# Patient Record
Sex: Female | Born: 1950 | Race: Black or African American | Hispanic: No | Marital: Married | Smoking: Former smoker
Health system: Southern US, Community
[De-identification: ages and names within clinical notes are randomized; demographics above are authoritative.]

## PROBLEM LIST (undated history)

## (undated) DIAGNOSIS — R001 Bradycardia, unspecified: Secondary | ICD-10-CM

## (undated) DIAGNOSIS — I10 Essential (primary) hypertension: Secondary | ICD-10-CM

## (undated) DIAGNOSIS — Z959 Presence of cardiac and vascular implant and graft, unspecified: Secondary | ICD-10-CM

## (undated) DIAGNOSIS — R55 Syncope and collapse: Principal | ICD-10-CM

## (undated) DIAGNOSIS — E039 Hypothyroidism, unspecified: Secondary | ICD-10-CM

## (undated) DIAGNOSIS — R011 Cardiac murmur, unspecified: Secondary | ICD-10-CM

## (undated) DIAGNOSIS — R9431 Abnormal electrocardiogram [ECG] [EKG]: Secondary | ICD-10-CM

## (undated) DIAGNOSIS — F0390 Unspecified dementia without behavioral disturbance: Secondary | ICD-10-CM

## (undated) HISTORY — DX: Cardiac murmur, unspecified: R01.1

## (undated) HISTORY — DX: Syncope and collapse: R55

## (undated) HISTORY — DX: Bradycardia, unspecified: R00.1

## (undated) HISTORY — PX: AUGMENTATION MAMMAPLASTY: SUR837

## (undated) HISTORY — DX: Abnormal electrocardiogram (ECG) (EKG): R94.31

## (undated) HISTORY — DX: Presence of cardiac and vascular implant and graft, unspecified: Z95.9

---

## 2000-11-28 ENCOUNTER — Encounter: Payer: Self-pay | Admitting: Emergency Medicine

## 2000-11-28 ENCOUNTER — Emergency Department (HOSPITAL_COMMUNITY): Admission: EM | Admit: 2000-11-28 | Discharge: 2000-11-28 | Payer: Self-pay | Admitting: *Deleted

## 2003-08-15 ENCOUNTER — Encounter: Admission: RE | Admit: 2003-08-15 | Discharge: 2003-08-15 | Payer: Self-pay | Admitting: Family Medicine

## 2003-09-03 ENCOUNTER — Other Ambulatory Visit: Admission: RE | Admit: 2003-09-03 | Discharge: 2003-09-03 | Payer: Self-pay | Admitting: Internal Medicine

## 2003-09-07 ENCOUNTER — Encounter: Admission: RE | Admit: 2003-09-07 | Discharge: 2003-09-07 | Payer: Self-pay | Admitting: Internal Medicine

## 2009-01-12 HISTORY — PX: BREAST BIOPSY: SHX20

## 2009-06-24 ENCOUNTER — Ambulatory Visit: Payer: Self-pay | Admitting: General Surgery

## 2009-07-01 ENCOUNTER — Ambulatory Visit: Payer: Self-pay | Admitting: Gastroenterology

## 2009-11-19 ENCOUNTER — Ambulatory Visit: Payer: Self-pay | Admitting: General Surgery

## 2010-02-02 ENCOUNTER — Encounter: Payer: Self-pay | Admitting: Family Medicine

## 2010-05-26 ENCOUNTER — Ambulatory Visit: Payer: Self-pay | Admitting: General Surgery

## 2011-08-13 ENCOUNTER — Ambulatory Visit (INDEPENDENT_AMBULATORY_CARE_PROVIDER_SITE_OTHER): Payer: 59 | Admitting: Internal Medicine

## 2011-08-13 ENCOUNTER — Encounter: Payer: Self-pay | Admitting: Internal Medicine

## 2011-08-13 VITALS — BP 118/78 | HR 88 | Temp 98.5°F | Resp 16 | Ht 64.0 in | Wt 171.5 lb

## 2011-08-13 DIAGNOSIS — Z23 Encounter for immunization: Secondary | ICD-10-CM

## 2011-08-13 DIAGNOSIS — Z720 Tobacco use: Secondary | ICD-10-CM

## 2011-08-13 DIAGNOSIS — M549 Dorsalgia, unspecified: Secondary | ICD-10-CM

## 2011-08-13 DIAGNOSIS — E039 Hypothyroidism, unspecified: Secondary | ICD-10-CM

## 2011-08-13 DIAGNOSIS — F17200 Nicotine dependence, unspecified, uncomplicated: Secondary | ICD-10-CM

## 2011-08-13 DIAGNOSIS — Z1239 Encounter for other screening for malignant neoplasm of breast: Secondary | ICD-10-CM

## 2011-08-13 DIAGNOSIS — G8929 Other chronic pain: Secondary | ICD-10-CM

## 2011-08-13 LAB — TSH: TSH: 6.7 u[IU]/mL — ABNORMAL HIGH (ref 0.35–5.50)

## 2011-08-13 NOTE — Patient Instructions (Addendum)
[place]  Makes a low carb whole wheat tortilla that has 26 grams of fiber    We checked your thyroid today ,  We will call you with the results.   You received the TdaP vaccine today (tetanus-diphtheria-pertussis)

## 2011-08-13 NOTE — Progress Notes (Signed)
Patient ID: Christine Hull, female   DOB: [birth year], 61 y.o.   MRN: [id number]  Patient Active Problem List  Diagnosis  . Tobacco abuse  . Unspecified hypothyroidism  . Chronic back pain greater than 3 months duration    Subjective:  CC:   Chief Complaint  Patient presents with  . New Patient    HPI:   Christine Hull is a 61 y.o. female who presents as a new patient to establish primary care with the chief complaint of   History reviewed. No pertinent past medical history.  Past Surgical History  Procedure Date  . Breast biopsy 2011    Sankar,  normal    Family History  Problem Relation Age of Onset  . Early death Mother     stroke resulting from prior head injury  . Stroke Mother   . Early death Father     MVA  . Cancer Maternal Aunt     stomach cancer    History   Social History  . Marital Status: Married    Spouse Name: N/A    Number of Children: N/A  . Years of Education: N/A   Occupational History  . Not on file.   Social History Main Topics  . Smoking status: Current Everyday Smoker -- 0.5 packs/day    Types: Cigarettes  . Smokeless tobacco: Never Used  . Alcohol Use: Yes  . Drug Use: No  . Sexually Active: Not on file   Other Topics Concern  . Not on file   Social History Narrative  . No narrative on file     No Known Allergies   Review of Systems:   The remainder of the review of systems was negative except those addressed in the HPI.    Objective:  BP 118/78  Pulse 88  Temp 98.5 F (36.9 C) (Oral)  Resp 16  Ht 5\' 4"  (1.626 m)  Wt 171 lb 8 oz (77.792 kg)  BMI 29.44 kg/m2  SpO2 94%  General appearance: alert, cooperative and appears stated age Ears: normal TM's and external ear canals both ears Throat: lips, mucosa, and tongue normal; teeth and gums normal Neck: no adenopathy, no carotid bruit, supple, symmetrical, trachea midline and thyroid not enlarged, symmetric, no tenderness/mass/nodules Back: symmetric, no  curvature. ROM normal. No CVA tenderness. Lungs: clear to auscultation bilaterally Heart: regular rate and rhythm, S1, S2 normal, no murmur, click, rub or gallop Abdomen: soft, non-tender; bowel sounds normal; no masses,  no organomegaly Pulses: 2+ and symmetric Skin: Skin color, texture, turgor normal. No rashes or lesions Lymph nodes: Cervical, supraclavicular, and axillary nodes normal.  Assessment and Plan:  Unspecified hypothyroidism Currently underactive on 50 mcg of levothyroxine daily and Cytomel 10 mg daily. Increased dose of levothyroxine sent to pharmacy. Repeat levels in 6 weeks.  Chronic back pain greater than 3 months duration Secondary to degenerative disc disease. Pain is currently under control and is not requiring use of narcotics.  Tobacco abuse Risks of continued tobacco use were discussed. She is not currently interested in pharmacology for tobacco cessation.  Suggested gradually reducing the number of cigarettes over time by reducing her daily consumption by one cigarette per week    Updated Medication List Outpatient Encounter Prescriptions as of 08/13/2011  Medication Sig Dispense Refill  . cholecalciferol (VITAMIN D) 1000 UNITS tablet Take 1,000 Units by mouth daily.      . fish oil-omega-3 fatty acids 1000 MG capsule Take 2 g by mouth daily.      Marland Kitchen  levothyroxine (SYNTHROID, LEVOTHROID) 75 MCG tablet Take 1 tablet (75 mcg total) by mouth daily.  30 tablet  5  . liothyronine (CYTOMEL) 5 MCG tablet Take 5 mcg by mouth daily.      Marland Kitchen losartan-hydrochlorothiazide (HYZAAR) 50-12.5 MG per tablet Take 1 tablet by mouth daily.      Marland Kitchen DISCONTD: levothyroxine (SYNTHROID, LEVOTHROID) 50 MCG tablet Take 50 mcg by mouth daily.         Orders Placed This Encounter  Procedures  . MM Digital Screening  . Tdap vaccine greater than or equal to 7yo IM  . TSH  . TSH    No Follow-up on file.

## 2011-08-14 DIAGNOSIS — E039 Hypothyroidism, unspecified: Secondary | ICD-10-CM | POA: Insufficient documentation

## 2011-08-14 MED ORDER — LEVOTHYROXINE SODIUM 75 MCG PO TABS: 75.0000 ug | ORAL_TABLET | Freq: Every day | ORAL | Status: DC

## 2011-08-14 NOTE — Assessment & Plan Note (Signed)
Currently underactive on 50 mcg of levothyroxine daily and Cytomel 10 mg daily. Increased dose of levothyroxine sent to pharmacy. Repeat levels in 6 weeks.

## 2011-08-16 DIAGNOSIS — M549 Dorsalgia, unspecified: Secondary | ICD-10-CM | POA: Insufficient documentation

## 2011-08-16 NOTE — Assessment & Plan Note (Signed)
Secondary to degenerative disc disease. Pain is currently under control and is not requiring use of narcotics.   

## 2011-08-16 NOTE — Assessment & Plan Note (Signed)
Risks of continued tobacco use were discussed. She is not currently interested in pharmacology for tobacco cessation.  Suggested gradually reducing the number of cigarettes over time by reducing her daily consumption by one cigarette per week    

## 2011-08-27 ENCOUNTER — Ambulatory Visit: Payer: Self-pay | Admitting: Internal Medicine

## 2011-09-04 ENCOUNTER — Encounter: Payer: Self-pay | Admitting: Internal Medicine

## 2011-09-21 ENCOUNTER — Other Ambulatory Visit (INDEPENDENT_AMBULATORY_CARE_PROVIDER_SITE_OTHER): Payer: 59

## 2011-09-21 DIAGNOSIS — E039 Hypothyroidism, unspecified: Secondary | ICD-10-CM

## 2011-09-21 LAB — TSH: TSH: 2.38 u[IU]/mL (ref 0.35–5.50)

## 2011-11-21 ENCOUNTER — Ambulatory Visit (INDEPENDENT_AMBULATORY_CARE_PROVIDER_SITE_OTHER): Admitting: Family Medicine

## 2011-11-21 ENCOUNTER — Encounter: Payer: Self-pay | Admitting: Family Medicine

## 2011-11-21 VITALS — BP 158/98 | HR 61 | Temp 98.0°F | Resp 16 | Ht 63.75 in | Wt 176.6 lb

## 2011-11-21 DIAGNOSIS — J40 Bronchitis, not specified as acute or chronic: Secondary | ICD-10-CM

## 2011-11-21 DIAGNOSIS — J329 Chronic sinusitis, unspecified: Secondary | ICD-10-CM

## 2011-11-21 MED ORDER — AMOXICILLIN 875 MG PO TABS: 875.0000 mg | ORAL_TABLET | Freq: Two times a day (BID) | ORAL | Status: DC

## 2011-11-21 MED ORDER — FLUTICASONE PROPIONATE 50 MCG/ACT NA SUSP: NASAL | Status: DC

## 2011-11-21 NOTE — Progress Notes (Signed)
Subjective: Patient has been ill for almost 2 weeks. She was sitting on the couch watching television and little later woke up with a with a bad cough and cold. That is persisted for the past 2 weeks. It went from the chest to the head and then both. She's not been terribly although she doesn't feel well. She does smoke. 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 Her husband travels a lot so he has not had the same thing.  Objective: Pleasant lady in no major distress. Her TMs are normal. Nose congested. Throat clear. Neck supple without significant nodes. Chest is clear to auscultation. Heart regular without murmurs.  Assessment: Sinusitis/bronchitis  Plan: Amoxicillin 875 twice a day. Flow Flonase 2 sprays each nostril twice a day for 3 days then daily. Advise get plenty of rest drink plenty of fluids.

## 2011-11-21 NOTE — Patient Instructions (Addendum)
Drink lots of fluids  Get plenty of rest  Take medications as ordered  Stop smoking

## 2011-12-29 ENCOUNTER — Telehealth: Payer: Self-pay | Admitting: Internal Medicine

## 2011-12-29 MED ORDER — VITAMIN D (ERGOCALCIFEROL) 1.25 MG (50000 UNIT) PO CAPS: 50000.0000 [IU] | ORAL_CAPSULE | ORAL | Status: DC

## 2011-12-29 NOTE — Telephone Encounter (Signed)
Refill request for Vitamin D 16109 sent to [facility]

## 2011-12-29 NOTE — Telephone Encounter (Signed)
Pt called she needs refill on her vit d Pt stated she has been trying to get this for a couple weeks. [facility] [place]   Pt is completely out  Of meds

## 2012-02-12 ENCOUNTER — Other Ambulatory Visit: Payer: Self-pay | Admitting: Internal Medicine

## 2012-03-07 ENCOUNTER — Other Ambulatory Visit: Payer: Self-pay | Admitting: Internal Medicine

## 2012-03-08 NOTE — Telephone Encounter (Signed)
Pt last seen 8/13. Last TSH done on 09/21/11. Ok to fill?

## 2012-05-25 ENCOUNTER — Other Ambulatory Visit: Payer: Self-pay | Admitting: *Deleted

## 2012-05-25 MED ORDER — LIOTHYRONINE SODIUM 5 MCG PO TABS: 5.0000 ug | ORAL_TABLET | Freq: Every day | ORAL | Status: DC

## 2012-07-31 ENCOUNTER — Other Ambulatory Visit: Payer: Self-pay | Admitting: Internal Medicine

## 2012-09-07 ENCOUNTER — Other Ambulatory Visit: Payer: Self-pay | Admitting: Internal Medicine

## 2012-09-15 ENCOUNTER — Telehealth: Payer: Self-pay | Admitting: Internal Medicine

## 2012-09-15 NOTE — Telephone Encounter (Signed)
Called pharmacy, pt still has refills left on file.

## 2012-09-15 NOTE — Telephone Encounter (Signed)
Refill needed on BP medication losartan.  Advised pt of protocol to call pharmacy, states she will remember that for next time.

## 2012-09-16 NOTE — Telephone Encounter (Signed)
Patient notified

## 2012-10-11 ENCOUNTER — Other Ambulatory Visit: Payer: Self-pay | Admitting: Internal Medicine

## 2012-10-11 NOTE — Telephone Encounter (Signed)
Last seen on:08/13/11-No future appt scheduled. Please advise on refill

## 2012-10-12 ENCOUNTER — Telehealth: Payer: Self-pay | Admitting: Internal Medicine

## 2012-10-12 DIAGNOSIS — E785 Hyperlipidemia, unspecified: Secondary | ICD-10-CM

## 2012-10-12 DIAGNOSIS — Z79899 Other long term (current) drug therapy: Secondary | ICD-10-CM

## 2012-10-12 DIAGNOSIS — E559 Vitamin D deficiency, unspecified: Secondary | ICD-10-CM

## 2012-10-12 DIAGNOSIS — E039 Hypothyroidism, unspecified: Secondary | ICD-10-CM

## 2012-10-12 NOTE — Telephone Encounter (Signed)
30 days only on the synthroid,  Must be seen prior to more refills. Needs fasting labs done prior to visit.

## 2012-10-12 NOTE — Telephone Encounter (Signed)
No more refills without an office visit !!!!!

## 2012-10-18 NOTE — Telephone Encounter (Signed)
Patient notified by DPR to husband he will deliver message patient to call and schedule.

## 2012-10-23 ENCOUNTER — Other Ambulatory Visit: Payer: Self-pay | Admitting: Internal Medicine

## 2012-10-26 NOTE — Telephone Encounter (Signed)
Appt 11/16/12.

## 2012-10-28 ENCOUNTER — Ambulatory Visit: Payer: 59 | Admitting: Internal Medicine

## 2012-10-28 ENCOUNTER — Other Ambulatory Visit (INDEPENDENT_AMBULATORY_CARE_PROVIDER_SITE_OTHER): Payer: 59

## 2012-10-28 DIAGNOSIS — Z79899 Other long term (current) drug therapy: Secondary | ICD-10-CM

## 2012-10-28 DIAGNOSIS — E559 Vitamin D deficiency, unspecified: Secondary | ICD-10-CM

## 2012-10-28 DIAGNOSIS — E039 Hypothyroidism, unspecified: Secondary | ICD-10-CM

## 2012-10-28 DIAGNOSIS — E785 Hyperlipidemia, unspecified: Secondary | ICD-10-CM

## 2012-10-28 LAB — CBC WITH DIFFERENTIAL/PLATELET
Eosinophils Relative: 2.3 % (ref 0.0–5.0)
HCT: 39.4 % (ref 36.0–46.0)
Hemoglobin: 13.6 g/dL (ref 12.0–15.0)
Lymphocytes Relative: 52.3 % — ABNORMAL HIGH (ref 12.0–46.0)
Lymphs Abs: 2.5 10*3/uL (ref 0.7–4.0)
MCHC: 34.6 g/dL (ref 30.0–36.0)
MCV: 98.1 fl (ref 78.0–100.0)
Monocytes Absolute: 0.4 10*3/uL (ref 0.1–1.0)
Monocytes Relative: 7.4 % (ref 3.0–12.0)
Neutrophils Relative %: 37.4 % — ABNORMAL LOW (ref 43.0–77.0)
RBC: 4.01 Mil/uL (ref 3.87–5.11)
RDW: 13 % (ref 11.5–14.6)

## 2012-10-28 LAB — COMPREHENSIVE METABOLIC PANEL
ALT: 10 U/L (ref 0–35)
AST: 15 U/L (ref 0–37)
Alkaline Phosphatase: 76 U/L (ref 39–117)
BUN: 14 mg/dL (ref 6–23)
CO2: 28 mEq/L (ref 19–32)
Calcium: 9.8 mg/dL (ref 8.4–10.5)
Creatinine, Ser: 0.9 mg/dL (ref 0.4–1.2)
Glucose, Bld: 104 mg/dL — ABNORMAL HIGH (ref 70–99)
Sodium: 142 mEq/L (ref 135–145)

## 2012-10-31 ENCOUNTER — Encounter: Payer: Self-pay | Admitting: *Deleted

## 2012-11-16 ENCOUNTER — Encounter (INDEPENDENT_AMBULATORY_CARE_PROVIDER_SITE_OTHER): Payer: Self-pay

## 2012-11-16 ENCOUNTER — Encounter: Payer: Self-pay | Admitting: Internal Medicine

## 2012-11-16 ENCOUNTER — Ambulatory Visit (INDEPENDENT_AMBULATORY_CARE_PROVIDER_SITE_OTHER): Payer: 59 | Admitting: Internal Medicine

## 2012-11-16 VITALS — BP 104/74 | HR 72 | Temp 99.1°F | Resp 12 | Ht 64.25 in | Wt 168.0 lb

## 2012-11-16 DIAGNOSIS — D229 Melanocytic nevi, unspecified: Secondary | ICD-10-CM

## 2012-11-16 DIAGNOSIS — Z1239 Encounter for other screening for malignant neoplasm of breast: Secondary | ICD-10-CM

## 2012-11-16 DIAGNOSIS — D239 Other benign neoplasm of skin, unspecified: Secondary | ICD-10-CM

## 2012-11-16 DIAGNOSIS — M549 Dorsalgia, unspecified: Secondary | ICD-10-CM

## 2012-11-16 DIAGNOSIS — F17200 Nicotine dependence, unspecified, uncomplicated: Secondary | ICD-10-CM

## 2012-11-16 DIAGNOSIS — G8929 Other chronic pain: Secondary | ICD-10-CM

## 2012-11-16 DIAGNOSIS — Z0000 Encounter for general adult medical examination without abnormal findings: Secondary | ICD-10-CM

## 2012-11-16 DIAGNOSIS — E039 Hypothyroidism, unspecified: Secondary | ICD-10-CM

## 2012-11-16 DIAGNOSIS — Z720 Tobacco use: Secondary | ICD-10-CM

## 2012-11-16 MED ORDER — CYTOMEL 5 MCG PO TABS: ORAL_TABLET | ORAL | Status: DC

## 2012-11-16 MED ORDER — LEVOTHYROXINE SODIUM 75 MCG PO TABS: ORAL_TABLET | ORAL | Status: DC

## 2012-11-16 NOTE — Progress Notes (Signed)
Pre-visit discussion using our clinic review tool. No additional management support is needed unless otherwise documented below in the visit note.  

## 2012-11-16 NOTE — Assessment & Plan Note (Signed)
TSH is normal .  continue current medication regimen

## 2012-11-16 NOTE — Patient Instructions (Signed)
You had your annual wellness exam today  We will schedule your mammogram soon.  I am sending you to Dr Roseanne Kaufman, [facility] for evaluation of the mole on your back that I mentioned. Amber will call you  with the appt

## 2012-11-16 NOTE — Progress Notes (Signed)
Patient ID: Christine Hull, female   DOB: 11-Mar-1950, 62 y.o.   MRN: [id number]  .  Subjective:     Christine Hull is a 62 y.o. female and is here for a comprehensive physical exam. The patient reports no problems.  History   Social History  . Marital Status: Married    Spouse Name: N/A    Number of Children: N/A  . Years of Education: N/A   Occupational History  . Not on file.   Social History Main Topics  . Smoking status: Current Every Day Smoker -- 0.50 packs/day    Types: Cigarettes  . Smokeless tobacco: Never Used  . Alcohol Use: Yes  . Drug Use: No  . Sexual Activity: No   Other Topics Concern  . Not on file   Social History Narrative  . No narrative on file   Health Maintenance  Topic Date Due  . Influenza Vaccine  08/12/2013  . Pap Smear  08/16/2013  . Mammogram  08/26/2013  . Colonoscopy  11/17/2019  . Tetanus/tdap  08/12/2021  . Zostavax  Completed    The following portions of the patient's history were reviewed and updated as appropriate: allergies, current medications, past family history, past medical history, past social history, past surgical history and problem list.  Review of Systems A comprehensive review of systems was negative.   Objective:   BP 104/74  Pulse 72  Temp(Src) 99.1 F (37.3 C) (Oral)  Resp 12  Ht 5' 4.25" (1.632 m)  Wt 168 lb (76.204 kg)  BMI 28.61 kg/m2  SpO2 98%  General Appearance:    Alert, cooperative, no distress, appears stated age  Head:    Normocephalic, without obvious abnormality, atraumatic  Eyes:    PERRL, conjunctiva/corneas clear, EOM's intact, fundi    benign, both eyes  Ears:    Normal TM's and external ear canals, both ears  Nose:   Nares normal, septum midline, mucosa normal, no drainage    or sinus tenderness  Throat:   Lips, mucosa, and tongue normal; teeth and gums normal  Neck:   Supple, symmetrical, trachea midline, no adenopathy;    thyroid:  no enlargement/tenderness/nodules; no  carotid   bruit or JVD  Back:     Symmetric, no curvature, ROM normal, no CVA tenderness  Lungs:     Clear to auscultation bilaterally, respirations unlabored  Chest Wall:    No tenderness or deformity   Heart:    Regular rate and rhythm, S1 and S2 normal, no murmur, rub   or gallop  Breast Exam:    No tenderness, masses, or nipple abnormality  Abdomen:     Soft, non-tender, bowel sounds active all four quadrants,    no masses, no organomegaly  Extremities:   Extremities normal, atraumatic, no cyanosis or edema  Pulses:   2+ and symmetric all extremities  Skin:   Skin color, texture, turgor normal, no rashes or lesions  Lymph nodes:   Cervical, supraclavicular, and axillary nodes normal  Neurologic:   CNII-XII intact, normal strength, sensation and reflexes    throughout     Assessment:   Unspecified hypothyroidism TSH is normal .  continue current medication regimen   Routine general medical examination at a health care facility Annual comprehensive exam was done including breast, excluding pelvic and PAP smear. All screenings have been addressed .   Chronic back pain greater than 3 months duration Secondary to degenerative disc disease. Pain is currently under control and is not  requiring use of narcotics.    Tobacco abuse Risks of continued tobacco use were discussed. She is not currently interested in pharmacology for tobacco cessation.  Suggested gradually reducing the number of cigarettes over time by reducing her daily consumption by one cigarette per week      Updated Medication List Outpatient Encounter Prescriptions as of 11/16/2012  Medication Sig  . CYTOMEL 5 MCG tablet TAKE 1 TABLET (5 MCG TOTAL) BY MOUTH DAILY.  . fish oil-omega-3 fatty acids 1000 MG capsule Take 2 g by mouth daily.  Marland Kitchen levothyroxine (SYNTHROID) 75 MCG tablet TAKE 1 TABLET (75 MCG TOTAL) BY MOUTH DAILY.  Marland Kitchen losartan-hydrochlorothiazide (HYZAAR) 50-12.5 MG per tablet TAKE 1 TABLET BY MOUTH EVERY  MORNING  . Vitamin D, Ergocalciferol, (DRISDOL) 50000 UNITS CAPS capsule Take 50,000 Units by mouth every 30 (thirty) days.  . [DISCONTINUED] CYTOMEL 5 MCG tablet TAKE 1 TABLET (5 MCG TOTAL) BY MOUTH DAILY.  . [DISCONTINUED] SYNTHROID 75 MCG tablet TAKE 1 TABLET (75 MCG TOTAL) BY MOUTH DAILY.  . [DISCONTINUED] amoxicillin (AMOXIL) 875 MG tablet Take 1 tablet (875 mg total) by mouth 2 (two) times daily.  . [DISCONTINUED] fluticasone (FLONASE) 50 MCG/ACT nasal spray Use 2 sprays each nostril twice daily for 3 days, then once daily  . [DISCONTINUED] SYNTHROID 75 MCG tablet TAKE 1 TABLET (75 MCG TOTAL) BY MOUTH DAILY.  . [DISCONTINUED] Vitamin D, Ergocalciferol, (DRISDOL) 50000 UNITS CAPS Take 1 capsule (50,000 Units total) by mouth every 7 (seven) days.

## 2012-11-17 ENCOUNTER — Encounter: Payer: Self-pay | Admitting: Internal Medicine

## 2012-11-17 DIAGNOSIS — Z0000 Encounter for general adult medical examination without abnormal findings: Secondary | ICD-10-CM | POA: Insufficient documentation

## 2012-11-17 NOTE — Assessment & Plan Note (Signed)
Annual comprehensive exam was done including breast, excluding pelvic and PAP smear. All screenings have been addressed .  

## 2012-11-17 NOTE — Assessment & Plan Note (Signed)
Risks of continued tobacco use were discussed. She is not currently interested in pharmacology for tobacco cessation.  Suggested gradually reducing the number of cigarettes over time by reducing her daily consumption by one cigarette per week

## 2012-11-17 NOTE — Assessment & Plan Note (Signed)
Secondary to degenerative disc disease. Pain is currently under control and is not requiring use of narcotics.

## 2013-01-05 ENCOUNTER — Other Ambulatory Visit: Payer: Self-pay | Admitting: Internal Medicine

## 2013-02-02 ENCOUNTER — Encounter (HOSPITAL_COMMUNITY): Payer: Self-pay | Admitting: Emergency Medicine

## 2013-02-02 ENCOUNTER — Emergency Department (HOSPITAL_COMMUNITY): Payer: BC Managed Care – PPO

## 2013-02-02 ENCOUNTER — Inpatient Hospital Stay (HOSPITAL_COMMUNITY)
Admission: EM | Admit: 2013-02-02 | Discharge: 2013-02-03 | DRG: 262 | Disposition: A | Discharge status: Home / Self Care | Payer: BC Managed Care – PPO | Attending: Internal Medicine | Admitting: Internal Medicine

## 2013-02-02 ENCOUNTER — Inpatient Hospital Stay (HOSPITAL_COMMUNITY): Payer: BC Managed Care – PPO

## 2013-02-02 DIAGNOSIS — R51 Headache: Secondary | ICD-10-CM | POA: Diagnosis present

## 2013-02-02 DIAGNOSIS — F17200 Nicotine dependence, unspecified, uncomplicated: Secondary | ICD-10-CM | POA: Diagnosis present

## 2013-02-02 DIAGNOSIS — I369 Nonrheumatic tricuspid valve disorder, unspecified: Secondary | ICD-10-CM

## 2013-02-02 DIAGNOSIS — Z823 Family history of stroke: Secondary | ICD-10-CM

## 2013-02-02 DIAGNOSIS — R413 Other amnesia: Secondary | ICD-10-CM

## 2013-02-02 DIAGNOSIS — I10 Essential (primary) hypertension: Secondary | ICD-10-CM | POA: Diagnosis present

## 2013-02-02 DIAGNOSIS — F0150 Vascular dementia without behavioral disturbance: Secondary | ICD-10-CM

## 2013-02-02 DIAGNOSIS — E039 Hypothyroidism, unspecified: Secondary | ICD-10-CM | POA: Diagnosis present

## 2013-02-02 DIAGNOSIS — R519 Headache, unspecified: Secondary | ICD-10-CM

## 2013-02-02 DIAGNOSIS — R9431 Abnormal electrocardiogram [ECG] [EKG]: Secondary | ICD-10-CM

## 2013-02-02 DIAGNOSIS — E876 Hypokalemia: Secondary | ICD-10-CM | POA: Diagnosis present

## 2013-02-02 DIAGNOSIS — R55 Syncope and collapse: Secondary | ICD-10-CM

## 2013-02-02 DIAGNOSIS — Z9181 History of falling: Secondary | ICD-10-CM

## 2013-02-02 HISTORY — DX: Syncope and collapse: R55

## 2013-02-02 HISTORY — DX: Essential (primary) hypertension: I10

## 2013-02-02 HISTORY — DX: Abnormal electrocardiogram (ECG) (EKG): R94.31

## 2013-02-02 HISTORY — DX: Hypothyroidism, unspecified: E03.9

## 2013-02-02 LAB — URINALYSIS, ROUTINE W REFLEX MICROSCOPIC
Bilirubin Urine: NEGATIVE
Glucose, UA: NEGATIVE mg/dL
Hgb urine dipstick: NEGATIVE
Ketones, ur: NEGATIVE mg/dL
LEUKOCYTES UA: NEGATIVE
NITRITE: NEGATIVE
PH: 6.5 (ref 5.0–8.0)
Protein, ur: NEGATIVE mg/dL
SPECIFIC GRAVITY, URINE: 1.004 — AB (ref 1.005–1.030)
UROBILINOGEN UA: 0.2 mg/dL (ref 0.0–1.0)

## 2013-02-02 LAB — CBC WITH DIFFERENTIAL/PLATELET
BASOS ABS: 0 10*3/uL (ref 0.0–0.1)
Basophils Relative: 1 % (ref 0–1)
Eosinophils Absolute: 0.1 10*3/uL (ref 0.0–0.7)
Eosinophils Relative: 1 % (ref 0–5)
HEMATOCRIT: 37.6 % (ref 36.0–46.0)
Hemoglobin: 13.2 g/dL (ref 12.0–15.0)
LYMPHS PCT: 13 % (ref 12–46)
Lymphs Abs: 1 10*3/uL (ref 0.7–4.0)
MCH: 34.4 pg — ABNORMAL HIGH (ref 26.0–34.0)
MCHC: 35.1 g/dL (ref 30.0–36.0)
MCV: 97.9 fL (ref 78.0–100.0)
Monocytes Absolute: 0.7 10*3/uL (ref 0.1–1.0)
Monocytes Relative: 9 % (ref 3–12)
NEUTROS PCT: 77 % (ref 43–77)
Neutro Abs: 5.6 10*3/uL (ref 1.7–7.7)
PLATELETS: 271 10*3/uL (ref 150–400)
RBC: 3.84 MIL/uL — ABNORMAL LOW (ref 3.87–5.11)
RDW: 13 % (ref 11.5–15.5)
WBC: 7.3 10*3/uL (ref 4.0–10.5)

## 2013-02-02 LAB — CBC
HCT: 36.6 % (ref 36.0–46.0)
HEMOGLOBIN: 12.6 g/dL (ref 12.0–15.0)
MCH: 34.1 pg — ABNORMAL HIGH (ref 26.0–34.0)
MCHC: 34.4 g/dL (ref 30.0–36.0)
MCV: 98.9 fL (ref 78.0–100.0)
Platelets: 273 10*3/uL (ref 150–400)
RBC: 3.7 MIL/uL — AB (ref 3.87–5.11)
RDW: 12.7 % (ref 11.5–15.5)
WBC: 7.8 10*3/uL (ref 4.0–10.5)

## 2013-02-02 LAB — TROPONIN I

## 2013-02-02 LAB — COMPREHENSIVE METABOLIC PANEL
ALT: 11 U/L (ref 0–35)
AST: 23 U/L (ref 0–37)
Albumin: 3.8 g/dL (ref 3.5–5.2)
Alkaline Phosphatase: 96 U/L (ref 39–117)
BUN: 11 mg/dL (ref 6–23)
CO2: 26 mEq/L (ref 19–32)
Calcium: 9.2 mg/dL (ref 8.4–10.5)
Chloride: 102 mEq/L (ref 96–112)
Creatinine, Ser: 0.96 mg/dL (ref 0.50–1.10)
GFR calc Af Amer: 71 mL/min — ABNORMAL LOW (ref >90)
GFR calc non Af Amer: 62 mL/min — ABNORMAL LOW (ref >90)
GLUCOSE: 114 mg/dL — AB (ref 70–99)
POTASSIUM: 3.6 meq/L — AB (ref 3.7–5.3)
Sodium: 143 mEq/L (ref 137–147)
TOTAL PROTEIN: 7.6 g/dL (ref 6.0–8.3)
Total Bilirubin: 0.7 mg/dL (ref 0.3–1.2)

## 2013-02-02 LAB — CREATININE, SERUM
CREATININE: 0.92 mg/dL (ref 0.50–1.10)
GFR calc Af Amer: 75 mL/min — ABNORMAL LOW (ref >90)
GFR calc non Af Amer: 65 mL/min — ABNORMAL LOW (ref >90)

## 2013-02-02 LAB — MAGNESIUM: Magnesium: 1.9 mg/dL (ref 1.5–2.5)

## 2013-02-02 LAB — TSH: TSH: 11.291 u[IU]/mL — AB (ref 0.350–4.500)

## 2013-02-02 LAB — RPR: RPR Ser Ql: NONREACTIVE

## 2013-02-02 LAB — VITAMIN B12: Vitamin B-12: 472 pg/mL (ref 211–911)

## 2013-02-02 MED ORDER — ONDANSETRON HCL 4 MG PO TABS: 4.0000 mg | ORAL_TABLET | Freq: Four times a day (QID) | ORAL | Status: DC | PRN

## 2013-02-02 MED ORDER — LEVOTHYROXINE SODIUM 75 MCG PO TABS
75.0000 ug | ORAL_TABLET | Freq: Every day | ORAL | Status: DC
  Administered 2013-02-03: 75 ug via ORAL
  Filled 2013-02-02 (×2): qty 1

## 2013-02-02 MED ORDER — POTASSIUM CHLORIDE CRYS ER 20 MEQ PO TBCR
40.0000 meq | EXTENDED_RELEASE_TABLET | Freq: Once | ORAL | Status: AC
  Administered 2013-02-02: 40 meq via ORAL
  Filled 2013-02-02: qty 2

## 2013-02-02 MED ORDER — NADOLOL 20 MG PO TABS
20.0000 mg | ORAL_TABLET | Freq: Two times a day (BID) | ORAL | Status: DC
  Administered 2013-02-03 (×2): 20 mg via ORAL
  Filled 2013-02-02 (×3): qty 1

## 2013-02-02 MED ORDER — PNEUMOCOCCAL VAC POLYVALENT 25 MCG/0.5ML IJ INJ
0.5000 mL | INJECTION | INTRAMUSCULAR | Status: DC
  Filled 2013-02-02: qty 0.5

## 2013-02-02 MED ORDER — HYDROCODONE-ACETAMINOPHEN 5-325 MG PO TABS: 1.0000 | ORAL_TABLET | ORAL | Status: DC | PRN

## 2013-02-02 MED ORDER — POTASSIUM CHLORIDE CRYS ER 20 MEQ PO TBCR
20.0000 meq | EXTENDED_RELEASE_TABLET | Freq: Every day | ORAL | Status: DC
  Filled 2013-02-02: qty 1

## 2013-02-02 MED ORDER — SODIUM CHLORIDE 0.9 % IJ SOLN: 3.0000 mL | Freq: Two times a day (BID) | INTRAMUSCULAR | Status: DC

## 2013-02-02 MED ORDER — SODIUM CHLORIDE 0.9 % IV SOLN: INTRAVENOUS | Status: DC

## 2013-02-02 MED ORDER — OMEGA-3-ACID ETHYL ESTERS 1 G PO CAPS
1.0000 g | ORAL_CAPSULE | Freq: Every day | ORAL | Status: DC
  Administered 2013-02-02 – 2013-02-03 (×2): 1 g via ORAL
  Filled 2013-02-02 (×2): qty 1

## 2013-02-02 MED ORDER — POTASSIUM CHLORIDE CRYS ER 20 MEQ PO TBCR: 20.0000 meq | EXTENDED_RELEASE_TABLET | Freq: Every day | ORAL | Status: DC

## 2013-02-02 MED ORDER — ENOXAPARIN SODIUM 40 MG/0.4ML ~~LOC~~ SOLN
40.0000 mg | SUBCUTANEOUS | Status: DC
  Administered 2013-02-02: 40 mg via SUBCUTANEOUS
  Filled 2013-02-02 (×2): qty 0.4

## 2013-02-02 MED ORDER — FOLIC ACID 1 MG PO TABS
1.0000 mg | ORAL_TABLET | Freq: Every day | ORAL | Status: DC
  Administered 2013-02-03: 1 mg via ORAL
  Filled 2013-02-02 (×2): qty 1

## 2013-02-02 MED ORDER — ACETAMINOPHEN 650 MG RE SUPP: 650.0000 mg | Freq: Four times a day (QID) | RECTAL | Status: DC | PRN

## 2013-02-02 MED ORDER — ACETAMINOPHEN 325 MG PO TABS: 650.0000 mg | ORAL_TABLET | Freq: Four times a day (QID) | ORAL | Status: DC | PRN

## 2013-02-02 MED ORDER — LIOTHYRONINE SODIUM 5 MCG PO TABS
5.0000 ug | ORAL_TABLET | Freq: Every day | ORAL | Status: DC
  Administered 2013-02-02 – 2013-02-03 (×2): 5 ug via ORAL
  Filled 2013-02-02 (×2): qty 1

## 2013-02-02 MED ORDER — VITAMIN D (ERGOCALCIFEROL) 1.25 MG (50000 UNIT) PO CAPS: 50000.0000 [IU] | ORAL_CAPSULE | ORAL | Status: DC

## 2013-02-02 MED ORDER — ONDANSETRON HCL 4 MG/2ML IJ SOLN: 4.0000 mg | Freq: Four times a day (QID) | INTRAMUSCULAR | Status: DC | PRN

## 2013-02-02 MED ORDER — SODIUM CHLORIDE 0.9 % IV SOLN: INTRAVENOUS | Status: AC

## 2013-02-02 MED ORDER — ADULT MULTIVITAMIN W/MINERALS CH
1.0000 | ORAL_TABLET | Freq: Every day | ORAL | Status: DC
  Administered 2013-02-03: 1 via ORAL
  Filled 2013-02-02 (×2): qty 1

## 2013-02-02 MED ORDER — OMEGA-3 FATTY ACIDS 1000 MG PO CAPS: 1.0000 g | ORAL_CAPSULE | Freq: Every day | ORAL | Status: DC

## 2013-02-02 MED ORDER — POTASSIUM CHLORIDE CRYS ER 20 MEQ PO TBCR: 40.0000 meq | EXTENDED_RELEASE_TABLET | Freq: Once | ORAL | Status: AC

## 2013-02-02 NOTE — ED Provider Notes (Signed)
CSN: [id number]     Arrival date & time 02/02/13  0102 History   First MD Initiated Contact with Patient 02/02/13 [phone]     Chief Complaint  Patient presents with  . Near Syncope    HPI  Patient presents after an episode of lightheadedness, head pain. Patient is in generally well, but only a history of hypertension and thyroid disorder. One week ago the patient had a fall. Fall was notable for a sodium.  Amnesia. The patient recalls only walking, then after a period of no recall she felt lightheaded, and after some time returned to baseline in terms of cognition.  The patient sustained facial abrasions, hematoma during the fall. She was not seen by a medical practitioner after the fall. In the interval week she has had generally improving condition. Over the past hours however, she has felt lightheaded, near syncopal, with no pain about the occiput. No complete syncope. Pain is worse with spine flexion. No vomiting, diarrhea, incontinence, visual changes, confusion, disorientation currently. No unilateral weakness.  Past Medical History  Diagnosis Date  . Thyroid disease   . Heart murmur   . Hypertension    Past Surgical History  Procedure Laterality Date  . Breast biopsy  2011    Sankar,  normal   Family History  Problem Relation Age of Onset  . Early death Mother     stroke resulting from prior head injury  . Stroke Mother   . Early death Father     MVA  . Cancer Maternal Aunt     stomach cancer   History  Substance Use Topics  . Smoking status: Current Every Day Smoker -- 0.50 packs/day    Types: Cigarettes  . Smokeless tobacco: Never Used  . Alcohol Use: Yes   OB History   Grav Para Term Preterm Abortions TAB SAB Ect Mult Living                 Review of Systems  Constitutional:       Per HPI, otherwise negative  HENT:       Per HPI, otherwise negative  Eyes: Negative for visual disturbance.  Respiratory:       Per HPI, otherwise negative    Cardiovascular:       Per HPI, otherwise negative  Gastrointestinal: Negative for nausea and vomiting.  Endocrine:       Negative aside from HPI  Genitourinary:       Neg aside from HPI   Musculoskeletal:       Per HPI, otherwise negative  Skin: Positive for wound.  Neurological: Positive for syncope and light-headedness. Negative for tremors, seizures, facial asymmetry and speech difficulty.    Allergies  Review of patient's allergies indicates no known allergies.  Home Medications   Current Outpatient Rx  Name  Route  Sig  Dispense  Refill  . CYTOMEL 5 MCG tablet      TAKE 1 TABLET (5 MCG TOTAL) BY MOUTH DAILY.   30 tablet   5     Dispense as written.   . fish oil-omega-3 fatty acids 1000 MG capsule   Oral   Take 2 g by mouth daily.         Marland Kitchen levothyroxine (SYNTHROID) 75 MCG tablet      TAKE 1 TABLET (75 MCG TOTAL) BY MOUTH DAILY.   30 tablet   5   . losartan-hydrochlorothiazide (HYZAAR) 50-12.5 MG per tablet      TAKE 1 TABLET BY MOUTH  EVERY MORNING   30 tablet   5   . Vitamin D, Ergocalciferol, (DRISDOL) 50000 UNITS CAPS capsule   Oral   Take 50,000 Units by mouth every 30 (thirty) days.          There were no vitals taken for this visit. Physical Exam  Nursing note and vitals reviewed. Constitutional: She is oriented to person, place, and time. She appears well-developed and well-nourished. No distress.  HENT:  Head: Normocephalic and atraumatic.    Eyes: Conjunctivae and EOM are normal.  Neck:  Patient has appropriate range of motion, though she has pain in the midline mid cervical spine with right rotation, neck extension  Cardiovascular: Normal rate and regular rhythm.   Pulmonary/Chest: Effort normal and breath sounds normal. No stridor. No respiratory distress.  Abdominal: She exhibits no distension.  Musculoskeletal: She exhibits no edema.  Neurological: She is alert and oriented to person, place, and time. She is not disoriented. She  displays no atrophy and no tremor. No cranial nerve deficit or sensory deficit. She exhibits normal muscle tone. She displays no seizure activity. Coordination normal.  Skin: Skin is warm and dry.  Psychiatric: She has a normal mood and affect.    ED Course  Procedures (including critical care time) Labs Review Labs Reviewed  COMPREHENSIVE METABOLIC PANEL  CBC WITH DIFFERENTIAL  URINALYSIS, ROUTINE W REFLEX MICROSCOPIC   Imaging Review No results found.  EKG Interpretation    Date/Time:  Thursday February 02 2013 07:57:25 EST Ventricular Rate:  61 PR Interval:  164 QRS Duration: 80 QT Interval:  523 QTC Calculation: 527 R Axis:   30 Text Interpretation:  Sinus rhythm Left atrial enlargement Low voltage, precordial leads Prolonged QT interval Sinus rhythm Artifact Left atrial enlargement T wave abnormality QT prolonged Abnormal ekg Confirmed by Carmin Muskrat  MD ([id number]) on 02/02/2013 8:08:42 AM           On re-exam the patient appears calm. We discussed all results.  I discussed her care w cards.  She will be admitted to IM w concern for syncope versus seizure.  MDM   1. Near syncope   2. Long QT interval    Patient presents after an episode of near syncope. By the time of my evaluation the patient has returned to baseline. Notably, the patient had a similar event one week ago that resulted in complete loss of consciousness, with subsequent period of amnesia.  These new events raise concern for dysrhythmia versus seizure. Today's ED eval demonstrates prolonged QT. Patient has minimal risk for dysrhythmia or seizure, but no prior eval. She was admitted for further E/M.     Carmin Muskrat, MD 02/02/13 1055

## 2013-02-02 NOTE — ED Notes (Signed)
Pt presents to department for evaluation of near syncope and headache. States she feels very lightheaded and dizziness this morning. Also states headache. Head pain becomes worse when bending over. 8/10 pain at the time. Also states she passed out and fell at home last week. Pt is conscious alert and oriented x4. No neurological deficits noted at the present.

## 2013-02-02 NOTE — Consult Note (Signed)
ELECTROPHYSIOLOGY CONSULT NOTE  Patient ID: Christine Hull MRN: [id number], DOB/AGE: [birth year]   Admit date: 02/02/2013 Date of Consult: 02/02/2013  Primary Physician: Duncan Dull, MD Primary Cardiologist: New to [facility] - Mayford Knife, MD Reason for Consultation: Syncope  History of Present Illness Christine Hull is a 63 y.o. female with HTN and hypothyroidism who presents to [facility] ED with syncope. She is accompanied by her daughter who assists with history. Two weeks ago she passed out at home while walking to the kitchen. She reports feeling like her usual self having "a normal day." She reports feeling "very heavy" then falling. However, she doesn't recall much else about the episode. She states it seemed to happen very quickly. She denies warning or prodrome. She denies CP, SOB, dizziness or palpitations. After regaining consciousness, she realized she injured herself and struck her head she presumes on the floor. She droveherself to her daughter's house after the episode. Her daughter has pictures on her iPhone which show large abrasion and small hematoma on the right cheek and forehead. She did not want to come to the hospital at that time stating, "I'm a tough ol' woman." Today she developed a severe headache prompting her to seek medical attention. She denies any history of CAD/MI/valvular heart disease or CHF. She denies any recent changes to her medications.  Past Medical History Past Medical History  Diagnosis Date  . Heart murmur   . Hypertension   . Hypothyroidism     Past Surgical History Past Surgical History  Procedure Laterality Date  . Breast biopsy  2011    Sankar,  normal    Allergies/Intolerances No Known Allergies  Current Home Medications      fish oil-omega-3 fatty acids 1000 MG capsule  Take 1 g by mouth daily.     levothyroxine 75 MCG tablet  Commonly known as:  SYNTHROID, LEVOTHROID  Take 75 mcg by mouth daily before breakfast.      liothyronine 5 MCG tablet  Commonly known as:  CYTOMEL  Take 5 mcg by mouth daily.     losartan-hydrochlorothiazide 50-12.5 MG per tablet  Commonly known as:  HYZAAR  Take 1 tablet by mouth daily.     Vitamin D (Ergocalciferol) 50000 UNITS Caps capsule  Commonly known as:  DRISDOL  Take 50,000 Units by mouth every 30 (thirty) days.     Inpatient Medications . sodium chloride   Intravenous STAT  . enoxaparin (LOVENOX) injection  40 mg Subcutaneous Q24H  . folic acid  1 mg Oral Daily  . [START ON 02/03/2013] levothyroxine  75 mcg Oral QAC breakfast  . liothyronine  5 mcg Oral Daily  . multivitamin with minerals  1 tablet Oral Daily  . omega-3 acid ethyl esters  1 g Oral Daily  . sodium chloride  3 mL Intravenous Q12H  . [START ON 02/12/2013] Vitamin D (Ergocalciferol)  50,000 Units Oral Q30 days   . sodium chloride      Family History Family History  Problem Relation Age of Onset  . Early death Mother     stroke resulting from prior head injury  . Stroke Mother   . Early death Father     MVA  . Cancer Maternal Aunt     stomach cancer     Social History History   Social History  . Marital Status: Married    Spouse Name: N/A    Number of Children: N/A  . Years of Education: N/A   Occupational History  .  Not on file.   Social History Main Topics  . Smoking status: Current Every Day Smoker -- 0.50 packs/day    Types: Cigarettes  . Smokeless tobacco: Never Used  . Alcohol Use: Yes     Comment: drinks it in coffee nightly before bed  . Drug Use: No  . Sexual Activity: No   Other Topics Concern  . Not on file   Social History Narrative  . No narrative on file     Review of Systems General: No chills, fever, night sweats or weight changes  Cardiovascular:  No chest pain, dyspnea on exertion, edema, orthopnea, palpitations, paroxysmal nocturnal dyspnea Dermatological: No rash, lesions or masses Respiratory: No cough, dyspnea Urologic: No hematuria,  dysuria Abdominal: No nausea, vomiting, diarrhea, bright red blood per rectum, melena, or hematemesis Neurologic: No visual changes, weakness, changes in mental status All other systems reviewed and are otherwise negative except as noted above.  Physical Exam Vitals: Blood pressure 100/60, pulse 71, temperature 98.9 F (37.2 C), temperature source Oral, resp. rate 18, height 5' 0.5" (1.537 m), weight 161 lb 1.6 oz (73.074 kg), SpO2 100.00%.  General: Well developed, well appearing 63 y.o. female in no acute distress. HEENT: Normocephalic, atraumatic. EOMs intact. Sclera nonicteric. Oropharynx clear.  Neck: Supple without bruits. No JVD. Lungs: Respirations regular and unlabored, CTA bilaterally. No wheezes, rales or rhonchi. Heart: RRR. S1, S2 present. No murmurs, rub, S3 or S4. Abdomen: Soft, non-tender, non-distended. BS present x 4 quadrants. No hepatosplenomegaly.  Extremities: No clubbing, cyanosis or edema. DP/PT/Radials 2+ and equal bilaterally. Psych: Normal affect. Neuro: Alert and oriented X 3. Moves all extremities spontaneously. Musculoskeletal: No kyphosis. Skin: Intact. Warm and dry. No rashes or petechiae in exposed areas.   Labs Lab Results  Component Value Date   WBC 7.3 02/02/2013   HGB 13.2 02/02/2013   HCT 37.6 02/02/2013   MCV 97.9 02/02/2013   PLT 271 02/02/2013    Recent Labs Lab 02/02/13 0825  NA 143  K 3.6*  CL 102  CO2 26  BUN 11  CREATININE 0.96  CALCIUM 9.2  PROT 7.6  BILITOT 0.7  ALKPHOS 96  ALT 11  AST 23  GLUCOSE 114*    Radiology/Studies Ct Head Wo Contrast  02/02/2013   CLINICAL DATA:  Fall 1 week ago; persistent left head and neck pain  EXAM: CT HEAD WITHOUT CONTRAST  CT CERVICAL SPINE WITHOUT CONTRAST  TECHNIQUE: Multidetector CT imaging of the head and cervical spine was performed following the standard protocol without intravenous contrast. Multiplanar CT image reconstructions of the cervical spine were also generated.  COMPARISON:  The  report from a remote prior CT scan of the head dated 11/28/2000 is available for review ; thyroid ultrasound 08/15/2003  FINDINGS: CT HEAD FINDINGS  Negative for acute intracranial hemorrhage, acute infarction, mass, mass effect, hydrocephalus or midline shift. Gray-white differentiation is preserved throughout.  No focal soft tissue or calvarial abnormality. No scalp hematoma. The visualized globes and orbits are intact and symmetric bilaterally. Minimal mineralization within the bilateral globus pallidum. Trace atherosclerotic calcification in the carotid siphons. Normal aeration of the mastoid air cells and visualized paranasal sinuses.  CT CERVICAL SPINE FINDINGS  No acute fracture, malalignment or prevertebral soft tissue swelling. Multilevel degenerative spondylosis of the cervical spine with the most significant areas at C4-C5, C5-C6, C6-C7 and T1-T2. There is a large posterior disc osteophyte complex at T1-T2 likely resulting in central canal stenosis. There is mild reversal of the normal cervical lordosis. Heterogeneous  and enlarged appearance of the thyroid gland. Similar findings were demonstrated on the remote prior ultrasound. No acute soft tissue abnormality. The lung apices are unremarkable.  IMPRESSION: CT HEAD:  Negative.  CT C SPINE:  1. No acute fracture or malalignment. 2. Multilevel cervical spondylosis most significant at T1-T2 or a large posterior disc osteophyte complex likely results and central canal stenosis. 3. Reversal of the normal cervical lordosis favored to be degenerative in the etiology. 4. Enlarged and heterogeneous thyroid gland. Similar findings seen on remote prior thyroid ultrasound from August of 2005.   Electronically Signed   By: Jacqulynn Cadet M.D.   On: 02/02/2013 08:29   Ct Cervical Spine Wo Contrast  02/02/2013   CLINICAL DATA:  Fall 1 week ago; persistent left head and neck pain  EXAM: CT HEAD WITHOUT CONTRAST  CT CERVICAL SPINE WITHOUT CONTRAST  TECHNIQUE:  Multidetector CT imaging of the head and cervical spine was performed following the standard protocol without intravenous contrast. Multiplanar CT image reconstructions of the cervical spine were also generated.  COMPARISON:  The report from a remote prior CT scan of the head dated 11/28/2000 is available for review ; thyroid ultrasound 08/15/2003  FINDINGS: CT HEAD FINDINGS  Negative for acute intracranial hemorrhage, acute infarction, mass, mass effect, hydrocephalus or midline shift. Gray-white differentiation is preserved throughout.  No focal soft tissue or calvarial abnormality. No scalp hematoma. The visualized globes and orbits are intact and symmetric bilaterally. Minimal mineralization within the bilateral globus pallidum. Trace atherosclerotic calcification in the carotid siphons. Normal aeration of the mastoid air cells and visualized paranasal sinuses.  CT CERVICAL SPINE FINDINGS  No acute fracture, malalignment or prevertebral soft tissue swelling. Multilevel degenerative spondylosis of the cervical spine with the most significant areas at C4-C5, C5-C6, C6-C7 and T1-T2. There is a large posterior disc osteophyte complex at T1-T2 likely resulting in central canal stenosis. There is mild reversal of the normal cervical lordosis. Heterogeneous and enlarged appearance of the thyroid gland. Similar findings were demonstrated on the remote prior ultrasound. No acute soft tissue abnormality. The lung apices are unremarkable.  IMPRESSION: CT HEAD:  Negative.  CT C SPINE:  1. No acute fracture or malalignment. 2. Multilevel cervical spondylosis most significant at T1-T2 or a large posterior disc osteophyte complex likely results and central canal stenosis. 3. Reversal of the normal cervical lordosis favored to be degenerative in the etiology. 4. Enlarged and heterogeneous thyroid gland. Similar findings seen on remote prior thyroid ultrasound from August of 2005.   Electronically Signed   By: Jacqulynn Cadet  M.D.   On: 02/02/2013 08:29   Mr Brain Wo Contrast  02/02/2013   CLINICAL DATA:  Presyncope/syncope, memory problems, and headaches. Recent fall. Rule out stroke.  EXAM: MRI HEAD WITHOUT CONTRAST  TECHNIQUE: Multiplanar, multiecho pulse sequences of the brain and surrounding structures were obtained without intravenous contrast.  COMPARISON:  Head CT 02/02/2013.  FINDINGS: There is no evidence of acute infarct. There is no intracranial hemorrhage. Periventricular white matter T2 hyperintensities are compatible with minimal chronic small vessel ischemic disease. There is mild cerebral atrophy. Orbits are unremarkable. Major intracranial vascular flow voids are unremarkable. Visualized paranasal sinuses and mastoid air cells are clear.  IMPRESSION: No evidence of infarct or other acute intracranial abnormality. Minimal chronic small vessel ischemic disease and cerebral atrophy.   Electronically Signed   By: Logan Bores   On: 02/02/2013 15:40   Echocardiogram  Normal LV function and normal chamber dimensions.   12-lead  ECG SR with some artifact and T wave flattening in lateral leads so difficult to assess QT Telemetry NSR  Assessment and Plan 1. Syncope - unclear etiology but question QT prolongation; will repeat ECG now for better quality to assess QT - check Mg and TSH - await echo  - with abrupt syncope and no prodrome, there is concern for primary arrhythmia and would favor ILR insertion if normal QT and normal LVEF - will follow with you   Dr. Lovena Le to see. Please see recommendations below. Signed, Ileene Hutchinson, PA-C 02/02/2013, 4:30 PM   EP Attending  Patient seen and examined. Agree with above history, physical exam, assessment. 62 yo woman with a single syncopal episode admitted with HA. She has an abnormal ECG with QT prolongation and bifid, flattened T waves, worrisome for long QT syndrome. Will start a beta blocker and consider insertion of an ILR. Family history is not benign  but difficult to interpret with motor vehicle accidents of unclear etiology.   Mikle Bosworth.D.

## 2013-02-02 NOTE — ED Notes (Signed)
Admitting MD at bedside.

## 2013-02-02 NOTE — H&P (Deleted)
Admit date: 02/02/2013 Referring Physician Dr. Vanita Panda Primary Cardiologist NONE Chief complaint/reason for admission: syncope  HPI: This is a 63yo AAF with a history of hypothyroidism and HTN who presented to the ER with complaints of presyncope/syncope and headache.  She says about 1 week ago she had a fall.  She says that she was walking down the hall into the kitchen and all of a sudden felt the top of her body going over but her feet would not move and she feel over.  She did not pass out and was not dizzy.  She has some amnesia after the episode and only recalled walking but then a few days later recalled that she had felt lightheaded and after some time her full cognition of the event returned.  She sustained facial abrasions on her forehead but did not seek medical attention.  She says something similar happened about 6 months ago at which time she had syncope.  Since falling last week she has had a low grade headache until last night when she was awakened with a severe headache in the back of her head and came to the ER.  She felt very lightheaded this am and last night.  She denies any chest pain or SOB.  She has no family history of sudden cardiac death.  Her EKG was noted to have a prolonged QTc.  Cardiology is now asked to consult for further evaluation of syncope and abnormal EKG with prolonged QTc.      PMH:    Past Medical History  Diagnosis Date  . Heart murmur   . Hypertension   . Hypothyroidism     PSH:    Past Surgical History  Procedure Laterality Date  . Breast biopsy  2011    Sankar,  normal    ALLERGIES:   Review of patient's allergies indicates no known allergies.  Prior to Admit Meds:   (Not in a hospital admission) Family HX:    Family History  Problem Relation Age of Onset  . Early death Mother     stroke resulting from prior head injury  . Stroke Mother   . Early death Father     MVA  . Cancer Maternal Aunt     stomach cancer   Social HX:    History    Social History  . Marital Status: Married    Spouse Name: N/A    Number of Children: N/A  . Years of Education: N/A   Occupational History  . Not on file.   Social History Main Topics  . Smoking status: Current Every Day Smoker -- 0.50 packs/day    Types: Cigarettes  . Smokeless tobacco: Never Used  . Alcohol Use: Yes     Comment: drinks it in coffee nightly before bed  . Drug Use: No  . Sexual Activity: No   Other Topics Concern  . Not on file   Social History Narrative  . No narrative on file     ROS:  All 11 ROS were addressed and are negative except what is stated in the HPI  PHYSICAL EXAM Filed Vitals:   02/02/13 1007  BP: 113/73  Pulse: 73  Temp:   Resp: 18   General: Well developed, well nourished, in no acute distress Head: Eyes PERRLA, No xanthomas.   Normal cephalic and atramatic  Lungs:   Clear bilaterally to auscultation and percussion. Heart:   HRRR S1 S2 Pulses are 2+ & equal.  No carotid bruit. No JVD.  No abdominal bruits. No femoral bruits. Abdomen: Bowel sounds are positive, abdomen soft and non-tender without masses  Extremities:   No clubbing, cyanosis or edema.  DP +1 Neuro: Alert and oriented X 3. Psych:  Good affect, responds appropriately   Labs:   Lab Results  Component Value Date   WBC 7.3 02/02/2013   HGB 13.2 02/02/2013   HCT 37.6 02/02/2013   MCV 97.9 02/02/2013   PLT 271 02/02/2013    Recent Labs Lab 02/02/13 0825  NA 143  K 3.6*  CL 102  CO2 26  BUN 11  CREATININE 0.96  CALCIUM 9.2  PROT 7.6  BILITOT 0.7  ALKPHOS 96  ALT 11  AST 23  GLUCOSE 114*   No results found for this basename: CKTOTAL, CKMB, CKMBINDEX, TROPONINI   No results found for this basename: PTT   No results found for this basename: INR, PROTIME     Lab Results  Component Value Date   CHOL 265* 10/28/2012   Lab Results  Component Value Date   HDL 71.90 10/28/2012   No results found for this basename: [facility]   Lab Results   Component Value Date   TRIG 157.0* 10/28/2012   Lab Results  Component Value Date   CHOLHDL 4 10/28/2012   Lab Results  Component Value Date   LDLDIRECT 171.5 10/28/2012      Radiology:  @RISRSLT24 @  EKG:  NSR with nonspecific ST abnormality and QTc 555msec  ASSESSMENT:  1.   Syncope of ? Etiology.  With long QTc on EKG need to consider primary arrhythmia.  There has been no history of SCD in her family.  Other differential diagnosis include orthostatic hypotension, coronary ischemia, seizure. 2.  Prolonged QTc on no QT prolonging medications 3.  Hypothyroidism 4.  HTN 5.  Borderline Low postassium 6.  Headache with no abnormality on head CT except for thoracic spondylosis with central canal stenosis  PLAN:   1.  Check 2D echo to evaluate LVF 2.  Replete potassium and recheck EKG 3.  EP consult for further evaluation of prolonged QTc  Sueanne Margarita, MD  02/02/2013  10:35 AM

## 2013-02-02 NOTE — Consult Note (Signed)
Admit date: 02/02/2013 Referring Physician Dr. Vanita Panda Primary Cardiologist NONE Chief complaint/reason for admission: syncope  HPI: This is a 63yo AAF with a history of hypothyroidism and HTN who presented to the ER with complaints of presyncope/syncope and headache.  She says about 1 week ago she had a fall.  She says that she was walking down the hall into the kitchen and all of a sudden felt the top of her body going over but her feet would not move and she feel over.  She did not pass out and was not dizzy.  She has some amnesia after the episode and only recalled walking but then a few days later recalled that she had felt lightheaded and after some time her full cognition of the event returned.  She sustained facial abrasions on her forehead but did not seek medical attention.  She says something similar happened about 6 months ago at which time she had syncope.  Since falling last week she has had a low grade headache until last night when she was awakened with a severe headache in the back of her head and came to the ER.  She felt very lightheaded this am and last night.  She denies any chest pain or SOB.  She has no family history of sudden cardiac death.  Her EKG was noted to have a prolonged QTc.  Cardiology is now asked to consult for further evaluation of syncope and abnormal EKG with prolonged QTc.      PMH:    Past Medical History  Diagnosis Date  . Heart murmur   . Hypertension   . Hypothyroidism     PSH:    Past Surgical History  Procedure Laterality Date  . Breast biopsy  2011    Sankar,  normal    ALLERGIES:   Review of patient's allergies indicates no known allergies.  Prior to Admit Meds:   (Not in a hospital admission) Family HX:    Family History  Problem Relation Age of Onset  . Early death Mother     stroke resulting from prior head injury  . Stroke Mother   . Early death Father     MVA  . Cancer Maternal Aunt     stomach cancer   Social HX:      History   Social History  . Marital Status: Married    Spouse Name: N/A    Number of Children: N/A  . Years of Education: N/A   Occupational History  . Not on file.   Social History Main Topics  . Smoking status: Current Every Day Smoker -- 0.50 packs/day    Types: Cigarettes  . Smokeless tobacco: Never Used  . Alcohol Use: Yes     Comment: drinks it in coffee nightly before bed  . Drug Use: No  . Sexual Activity: No   Other Topics Concern  . Not on file   Social History Narrative  . No narrative on file     ROS:  All 11 ROS were addressed and are negative except what is stated in the HPI  PHYSICAL EXAM Filed Vitals:   02/02/13 1007  BP: 113/73  Pulse: 73  Temp:   Resp: 18   General: Well developed, well nourished, in no acute distress Head: Eyes PERRLA, No xanthomas.   Normal cephalic and atramatic  Lungs:   Clear bilaterally to auscultation and percussion. Heart:   HRRR S1 S2 Pulses are 2+ & equal.  No carotid bruit. No JVD.  No abdominal bruits. No femoral bruits. Abdomen: Bowel sounds are positive, abdomen soft and non-tender without masses  Extremities:   No clubbing, cyanosis or edema.  DP +1 Neuro: Alert and oriented X 3. Psych:  Good affect, responds appropriately   Labs:   Lab Results  Component Value Date   WBC 7.3 02/02/2013   HGB 13.2 02/02/2013   HCT 37.6 02/02/2013   MCV 97.9 02/02/2013   PLT 271 02/02/2013    Recent Labs Lab 02/02/13 0825  NA 143  K 3.6*  CL 102  CO2 26  BUN 11  CREATININE 0.96  CALCIUM 9.2  PROT 7.6  BILITOT 0.7  ALKPHOS 96  ALT 11  AST 23  GLUCOSE 114*   No results found for this basename: CKTOTAL, CKMB, CKMBINDEX, TROPONINI   No results found for this basename: PTT   No results found for this basename: INR, PROTIME     Lab Results  Component Value Date   CHOL 265* 10/28/2012   Lab Results  Component Value Date   HDL 71.90 10/28/2012   No results found for this basename: [facility]   Lab  Results  Component Value Date   TRIG 157.0* 10/28/2012   Lab Results  Component Value Date   CHOLHDL 4 10/28/2012   Lab Results  Component Value Date   LDLDIRECT 171.5 10/28/2012      Radiology:  @RISRSLT24 @  EKG:  NSR with nonspecific ST abnormality and QTc 586msec  ASSESSMENT:  1.   Syncope of ? Etiology.  With long QTc on EKG need to consider primary arrhythmia.  There has been no history of SCD in her family.  Other differential diagnosis include orthostatic hypotension, coronary ischemia, seizure. 2.  Prolonged QTc on no QT prolonging medications 3.  Hypothyroidism 4.  HTN 5.  Borderline Low postassium 6.  Headache with no abnormality on head CT except for thoracic spondylosis with central canal stenosis  PLAN:   1.  Check 2D echo to evaluate LVF 2.  Replete potassium and recheck EKG 3.  EP consult for further evaluation of prolonged QTc 4.  NPO after midnight 5.  Stress myoview in am  Sueanne Margarita, MD  02/02/2013  10:35 AM

## 2013-02-02 NOTE — Progress Notes (Signed)
Echo Lab  2D Echocardiogram completed.  [place], [place] 02/02/2013 12:08 PM

## 2013-02-02 NOTE — H&P (Signed)
[facility] History and Physical  Christine Hull [id number] DOB: 13-Jan-1950 DOA: 02/02/2013  Referring physician: ED physician.  PCP: Deborra Medina, MD   Chief Complaint: headaches.   HPI: Christine Hull is a 63 y.o. female with PMH significant for hypothyroidism, Hypertension who presents to RD complaining of headache that started the morning of admission. She relates pain frontal area, 4/10 radiates to frontal area. She denies focal weakness, slurred speech. She notice this morning lightheaded , dizziness when she stand up. She report a fall 1 week ago. She was walking in the kitchen when she lean forward for no reason and next thing she fell. That day she had some confusion. She forgot that she fall.  Daughter also notice short term memory problems for last 9 months.  Patient denies chest pain, dyspnea, abdominal pain, diarrhea, nausea vomiting.  In the ED she was found to have prolong QT, low potassium. Cardiology has already seen the patient.     Review of Systems:  Negative except as per HPI.   Past Medical History  Diagnosis Date  . Heart murmur   . Hypertension   . Hypothyroidism    Past Surgical History  Procedure Laterality Date  . Breast biopsy  2011    Sankar,  normal   Social History:  reports that she has been smoking Cigarettes.  She has been smoking about 0.50 packs per day. She has never used smokeless tobacco. She reports that she drinks alcohol. She reports that she does not use illicit drugs.  No Known Allergies  Family History  Problem Relation Age of Onset  . Early death Mother     stroke resulting from prior head injury  . Stroke Mother   . Early death Father     MVA  . Cancer Maternal Aunt     stomach cancer     Prior to Admission medications   Medication Sig Start Date End Date Taking? Authorizing Provider  fish oil-omega-3 fatty acids 1000 MG capsule Take 1 g by mouth daily.    Yes Historical Provider, MD  levothyroxine  (SYNTHROID, LEVOTHROID) 75 MCG tablet Take 75 mcg by mouth daily before breakfast.   Yes Historical Provider, MD  liothyronine (CYTOMEL) 5 MCG tablet Take 5 mcg by mouth daily.   Yes Historical Provider, MD  losartan-hydrochlorothiazide (HYZAAR) 50-12.5 MG per tablet Take 1 tablet by mouth daily.   Yes Historical Provider, MD  Vitamin D, Ergocalciferol, (DRISDOL) 50000 UNITS CAPS capsule Take 50,000 Units by mouth every 30 (thirty) days.   Yes Historical Provider, MD   Physical Exam: Filed Vitals:   02/02/13 1100  BP: 118/81  Pulse: 64  Temp:   Resp: 24    BP 118/81  Pulse 64  Temp(Src) 99.1 F (37.3 C) (Oral)  Resp 24  SpO2 98%  General:  Appears calm and comfortable Eyes: PERRL, normal lids, irises & conjunctiva ENT: grossly normal hearing, lips & tongue Neck: no LAD, masses or thyromegaly Cardiovascular: RRR, no m/r/g. No LE edema. Telemetry: SR, no arrhythmias  Respiratory: CTA bilaterally, no w/r/r. Normal respiratory effort. Abdomen: soft, ntnd Skin: no rash or induration seen on limited exam Musculoskeletal: grossly normal tone BUE/BLE Psychiatric: grossly normal mood and affect, speech fluent and appropriate Neurologic: grossly non-focal.          Labs on Admission:  Basic Metabolic Panel:  Recent Labs Lab 02/02/13 0825  NA 143  K 3.6*  CL 102  CO2 26  GLUCOSE 114*  BUN 11  CREATININE  0.96  CALCIUM 9.2   Liver Function Tests:  Recent Labs Lab 02/02/13 0825  AST 23  ALT 11  ALKPHOS 96  BILITOT 0.7  PROT 7.6  ALBUMIN 3.8   No results found for this basename: LIPASE, AMYLASE,  in the last 168 hours No results found for this basename: AMMONIA,  in the last 168 hours CBC:  Recent Labs Lab 02/02/13 0825  WBC 7.3  NEUTROABS 5.6  HGB 13.2  HCT 37.6  MCV 97.9  PLT 271   Cardiac Enzymes: No results found for this basename: CKTOTAL, CKMB, CKMBINDEX, TROPONINI,  in the last 168 hours  BNP (last 3 results) No results found for this basename:  PROBNP,  in the last 8760 hours CBG: No results found for this basename: GLUCAP,  in the last 168 hours  Radiological Exams on Admission: Ct Head Wo Contrast  02/02/2013   CLINICAL DATA:  Fall 1 week ago; persistent left head and neck pain  EXAM: CT HEAD WITHOUT CONTRAST  CT CERVICAL SPINE WITHOUT CONTRAST  TECHNIQUE: Multidetector CT imaging of the head and cervical spine was performed following the standard protocol without intravenous contrast. Multiplanar CT image reconstructions of the cervical spine were also generated.  COMPARISON:  The report from a remote prior CT scan of the head dated 11/28/2000 is available for review ; thyroid ultrasound 08/15/2003  FINDINGS: CT HEAD FINDINGS  Negative for acute intracranial hemorrhage, acute infarction, mass, mass effect, hydrocephalus or midline shift. Gray-white differentiation is preserved throughout.  No focal soft tissue or calvarial abnormality. No scalp hematoma. The visualized globes and orbits are intact and symmetric bilaterally. Minimal mineralization within the bilateral globus pallidum. Trace atherosclerotic calcification in the carotid siphons. Normal aeration of the mastoid air cells and visualized paranasal sinuses.  CT CERVICAL SPINE FINDINGS  No acute fracture, malalignment or prevertebral soft tissue swelling. Multilevel degenerative spondylosis of the cervical spine with the most significant areas at C4-C5, C5-C6, C6-C7 and T1-T2. There is a large posterior disc osteophyte complex at T1-T2 likely resulting in central canal stenosis. There is mild reversal of the normal cervical lordosis. Heterogeneous and enlarged appearance of the thyroid gland. Similar findings were demonstrated on the remote prior ultrasound. No acute soft tissue abnormality. The lung apices are unremarkable.  IMPRESSION: CT HEAD:  Negative.  CT C SPINE:  1. No acute fracture or malalignment. 2. Multilevel cervical spondylosis most significant at T1-T2 or a large posterior  disc osteophyte complex likely results and central canal stenosis. 3. Reversal of the normal cervical lordosis favored to be degenerative in the etiology. 4. Enlarged and heterogeneous thyroid gland. Similar findings seen on remote prior thyroid ultrasound from August of 2005.   Electronically Signed   By: Jacqulynn Cadet M.D.   On: 02/02/2013 08:29   Ct Cervical Spine Wo Contrast  02/02/2013   CLINICAL DATA:  Fall 1 week ago; persistent left head and neck pain  EXAM: CT HEAD WITHOUT CONTRAST  CT CERVICAL SPINE WITHOUT CONTRAST  TECHNIQUE: Multidetector CT imaging of the head and cervical spine was performed following the standard protocol without intravenous contrast. Multiplanar CT image reconstructions of the cervical spine were also generated.  COMPARISON:  The report from a remote prior CT scan of the head dated 11/28/2000 is available for review ; thyroid ultrasound 08/15/2003  FINDINGS: CT HEAD FINDINGS  Negative for acute intracranial hemorrhage, acute infarction, mass, mass effect, hydrocephalus or midline shift. Gray-white differentiation is preserved throughout.  No focal soft tissue or calvarial abnormality.  No scalp hematoma. The visualized globes and orbits are intact and symmetric bilaterally. Minimal mineralization within the bilateral globus pallidum. Trace atherosclerotic calcification in the carotid siphons. Normal aeration of the mastoid air cells and visualized paranasal sinuses.  CT CERVICAL SPINE FINDINGS  No acute fracture, malalignment or prevertebral soft tissue swelling. Multilevel degenerative spondylosis of the cervical spine with the most significant areas at C4-C5, C5-C6, C6-C7 and T1-T2. There is a large posterior disc osteophyte complex at T1-T2 likely resulting in central canal stenosis. There is mild reversal of the normal cervical lordosis. Heterogeneous and enlarged appearance of the thyroid gland. Similar findings were demonstrated on the remote prior ultrasound. No acute  soft tissue abnormality. The lung apices are unremarkable.  IMPRESSION: CT HEAD:  Negative.  CT C SPINE:  1. No acute fracture or malalignment. 2. Multilevel cervical spondylosis most significant at T1-T2 or a large posterior disc osteophyte complex likely results and central canal stenosis. 3. Reversal of the normal cervical lordosis favored to be degenerative in the etiology. 4. Enlarged and heterogeneous thyroid gland. Similar findings seen on remote prior thyroid ultrasound from August of 2005.   Electronically Signed   By: Jacqulynn Cadet M.D.   On: 02/02/2013 08:29    EKG: Independently reviewed. QT 523. Sinus bradycardia.   Assessment/Plan Active Problems:   Near syncope   Headache   Amnesia memory loss   Hypokalemia   Prolonged QT interval  1- Near syncope: admit to telemetry. Cycle cardiac enzymes. Replete electrolytes. Orthostatic vitals.  2-Headache, memory loss. Check MRI. B 12, RPR, TSH.  3-Prolong QT; replete potassium. Check Mg level. Admit to telemetry. Cardio planning EP consult and Myoview.   4-Hypothyroidism: continue with synthroid.      Code Status: presume Full Code.  Family Communication: care discussed with daughter.  Disposition Plan: expect 2 to 3 days.   Time spent: 75 minutes.   [facility] Pager [phone]

## 2013-02-03 ENCOUNTER — Encounter (HOSPITAL_COMMUNITY)
Admission: EM | Disposition: A | Discharge status: Home / Self Care | Payer: Self-pay | Source: Home / Self Care | Attending: Internal Medicine

## 2013-02-03 ENCOUNTER — Inpatient Hospital Stay (HOSPITAL_COMMUNITY): Payer: BC Managed Care – PPO

## 2013-02-03 DIAGNOSIS — R55 Syncope and collapse: Secondary | ICD-10-CM

## 2013-02-03 DIAGNOSIS — R079 Chest pain, unspecified: Secondary | ICD-10-CM

## 2013-02-03 HISTORY — PX: LOOP RECORDER IMPLANT: SHX5477

## 2013-02-03 LAB — BASIC METABOLIC PANEL
BUN: 8 mg/dL (ref 6–23)
CO2: 26 mEq/L (ref 19–32)
Calcium: 9 mg/dL (ref 8.4–10.5)
Chloride: 104 mEq/L (ref 96–112)
Creatinine, Ser: 0.88 mg/dL (ref 0.50–1.10)
GFR calc Af Amer: 79 mL/min — ABNORMAL LOW (ref >90)
GFR, EST NON AFRICAN AMERICAN: 68 mL/min — AB (ref >90)
Glucose, Bld: 94 mg/dL (ref 70–99)
POTASSIUM: 3.7 meq/L (ref 3.7–5.3)
SODIUM: 142 meq/L (ref 137–147)

## 2013-02-03 LAB — TROPONIN I
Troponin I: <0.3 ng/mL (ref <0.30)
Troponin I: <0.3 ng/mL (ref <0.30)

## 2013-02-03 SURGERY — LOOP RECORDER IMPLANT
Anesthesia: LOCAL

## 2013-02-03 MED ORDER — TECHNETIUM TC 99M SESTAMIBI GENERIC - CARDIOLITE
10.0000 | Freq: Once | INTRAVENOUS | Status: AC | PRN
  Administered 2013-02-03: 10 via INTRAVENOUS

## 2013-02-03 MED ORDER — NADOLOL 20 MG PO TABS: 20.0000 mg | ORAL_TABLET | Freq: Two times a day (BID) | ORAL | Status: DC

## 2013-02-03 MED ORDER — REGADENOSON 0.4 MG/5ML IV SOLN
0.4000 mg | Freq: Once | INTRAVENOUS | Status: AC
  Administered 2013-02-03: 0.4 mg via INTRAVENOUS

## 2013-02-03 MED ORDER — LIDOCAINE-EPINEPHRINE 1 %-1:100000 IJ SOLN
INTRAMUSCULAR | Status: AC
Start: 2013-02-03 — End: 2013-02-03
  Filled 2013-02-03: qty 1

## 2013-02-03 MED ORDER — TECHNETIUM TC 99M SESTAMIBI GENERIC - CARDIOLITE
30.0000 | Freq: Once | INTRAVENOUS | Status: AC | PRN
  Administered 2013-02-03: 30 via INTRAVENOUS

## 2013-02-03 MED ORDER — REGADENOSON 0.4 MG/5ML IV SOLN
INTRAVENOUS | Status: AC
  Administered 2013-02-03: 0.4 mg via INTRAVENOUS
  Filled 2013-02-03: qty 5

## 2013-02-03 MED ORDER — POTASSIUM CHLORIDE CRYS ER 20 MEQ PO TBCR
40.0000 meq | EXTENDED_RELEASE_TABLET | Freq: Once | ORAL | Status: AC
  Administered 2013-02-03: 40 meq via ORAL
  Filled 2013-02-03: qty 2

## 2013-02-03 NOTE — Progress Notes (Signed)
Spoke with Dr. Aundra Dubin about pt being able to d/c. Made aware pt had lightheadedness at times, loop recorder placed this am. Dr. Aundra Dubin stated ok to d/c per cardiology. Notified Dr. Tyrell Antonio. Carroll Kinds RN

## 2013-02-03 NOTE — CV Procedure (Signed)
Pre op Dx  syncope Post op Dx    Procedure  Loop Recorder implantation  After routine prep and drape of the left parasternal area, a small incision was created. A [employer] LINQ Reveal Loop Recorder  Serial Number  [id number] was inserted.    Dermabond was  applied.  The patient tolerated the procedure without apparent complication.

## 2013-02-03 NOTE — Progress Notes (Signed)
Subjective: No complaints. Denies CP/SOB. No further syncope/ near syncope  Objective: Vital signs in last 24 hours: Temp:  [98.9 F (37.2 C)-99.7 F (37.6 C)] 99.7 F (37.6 C) (01/23 0400) Pulse Rate:  [53-88] 53 (01/23 0400) Resp:  [18-24] 19 (01/23 0400) BP: (93-122)/(58-84) 119/65 mmHg (01/23 0920) SpO2:  [94 %-100 %] 100 % (01/23 0400) Weight:  [161 lb 1.6 oz (73.074 kg)] 161 lb 1.6 oz (73.074 kg) (01/22 1622)    Intake/Output from previous day:   Intake/Output this shift:    Medications Current Facility-Administered Medications  Medication Dose Route Frequency Provider Last Rate Last Dose  . 0.9 %  sodium chloride infusion   Intravenous STAT Carmin Muskrat, MD      . 0.9 %  sodium chloride infusion   Intravenous Continuous Belkys A Regalado, MD      . acetaminophen (TYLENOL) tablet 650 mg  650 mg Oral Q6H PRN Belkys A Regalado, MD       Or  . acetaminophen (TYLENOL) suppository 650 mg  650 mg Rectal Q6H PRN Belkys A Regalado, MD      . enoxaparin (LOVENOX) injection 40 mg  40 mg Subcutaneous Q24H Belkys A Regalado, MD   40 mg at 02/02/13 2107  . folic acid (FOLVITE) tablet 1 mg  1 mg Oral Daily Belkys A Regalado, MD      . HYDROcodone-acetaminophen (NORCO/VICODIN) 5-325 MG per tablet 1-2 tablet  1-2 tablet Oral Q4H PRN Belkys A Regalado, MD      . levothyroxine (SYNTHROID, LEVOTHROID) tablet 75 mcg  75 mcg Oral QAC breakfast Belkys A Regalado, MD      . liothyronine (CYTOMEL) tablet 5 mcg  5 mcg Oral Daily Belkys A Regalado, MD   5 mcg at 02/02/13 2107  . multivitamin with minerals tablet 1 tablet  1 tablet Oral Daily Belkys A Regalado, MD      . nadolol (CORGARD) tablet 20 mg  20 mg Oral BID Evans Lance, MD   20 mg at 02/03/13 0044  . omega-3 acid ethyl esters (LOVAZA) capsule 1 g  1 g Oral Daily Belkys A Regalado, MD   1 g at 02/02/13 2108  . ondansetron (ZOFRAN) tablet 4 mg  4 mg Oral Q6H PRN Belkys A Regalado, MD       Or  . ondansetron (ZOFRAN) injection 4  mg  4 mg Intravenous Q6H PRN Belkys A Regalado, MD      . pneumococcal 23 valent vaccine (PNU-IMMUNE) injection 0.5 mL  0.5 mL Intramuscular Tomorrow-1000 Belkys A Regalado, MD      . potassium chloride SA (K-DUR,KLOR-CON) CR tablet 40 mEq  40 mEq Oral Once Belkys A Regalado, MD      . sodium chloride 0.9 % injection 3 mL  3 mL Intravenous Q12H Belkys A Regalado, MD      . Derrill Memo ON 02/12/2013] Vitamin D (Ergocalciferol) (DRISDOL) capsule 50,000 Units  50,000 Units Oral Q30 days Belkys A Regalado, MD        PE: General appearance: alert, cooperative and no distress Lungs: clear to auscultation bilaterally Heart: regular rate and rhythm and 1/6 murmur along LSB Extremities: no LEE Pulses: 2+ and symmetric Skin: warm and dry Neurologic: Grossly normal  Lab Results:   Recent Labs  02/02/13 0825 02/02/13 1640  WBC 7.3 7.8  HGB 13.2 12.6  HCT 37.6 36.6  PLT 271 273   BMET  Recent Labs  02/02/13 0825 02/02/13 1640 02/03/13 0405  NA 143  --  142  K 3.6*  --  3.7  CL 102  --  104  CO2 26  --  26  GLUCOSE 114*  --  94  BUN 11  --  8  CREATININE 0.96 0.92 0.88  CALCIUM 9.2  --  9.0     Assessment/Plan  Active Problems:   Near syncope   Headache   Amnesia memory loss   Hypokalemia   Prolonged QT interval  Plan: No recurrent symptoms. Lexiscan NST completed this am. Results pending. Radiologist interpretation to follow. Pt tolerated the test well. She had slight downward sloping ST depressions in lead III, but no other changes. Appropriate BP and HR response. She had mild abdominal discomfort after infusion of Lexiscan, however symptoms resolved by completion of test. Continue to monitor for now. Additional plans will depend on test results.  MD to follow.     LOS: 1 day    Maanasa Aderhold M. Rosita Fire, PA-C 02/03/2013 9:54 AM

## 2013-02-03 NOTE — Progress Notes (Signed)
Nutrition Brief Note  Patient identified on the Malnutrition Screening Tool (MST) Report for unintentional weight loss. Per discussion with patient's daughter and husband, she has lost a little weight because she has been trying to lose weight. Her appetite has decreased over the past 15 years, but intake is consistent. She eats small meals and snacks throughout the day. 4% weight loss in the past 3 months is insignificant.   Wt Readings from Last 15 Encounters:  02/02/13 161 lb 1.6 oz (73.074 kg)  02/02/13 161 lb 1.6 oz (73.074 kg)  11/16/12 168 lb (76.204 kg)  11/21/11 176 lb 9.6 oz (80.105 kg)  08/13/11 171 lb 8 oz (77.792 kg)    Body mass index is 30.93 kg/(m^2). Patient meets criteria for obesity, class 1 based on current BMI.   Current diet order is NPO for a procedure this morning, patient does not like the hospital food. Her family is bringing in meals from home. Labs and medications reviewed.   No nutrition interventions warranted at this time. If nutrition issues arise, please consult RD.    Molli Barrows, RD, LDN, [place] Pager [phone] After Hours Pager [phone]

## 2013-02-03 NOTE — Interval H&P Note (Signed)
History and Physical Interval Note:  02/03/2013 10:38 AM  Christine Hull  has presented today for surgery, with the diagnosis of snycope  The various methods of treatment have been discussed with the patient and family. After consideration of risks, benefits and other options for treatment, the patient has consented to  Procedure(s): LOOP RECORDER IMPLANT (N/A) as a surgical intervention .  The patient's history has been reviewed, patient examined, no change in status, stable for surgery.  I have reviewed the patient's chart and labs.  Questions were answered to the patient's satisfaction.     Virl Axe

## 2013-02-03 NOTE — Progress Notes (Signed)
Pt ruled out for TIA/Stroke, CT head and MRI negative. Loop recorder placed for source of syncope and dizziness. Stress test  Negative. Christine Hull

## 2013-02-03 NOTE — Progress Notes (Signed)
Utilization review completed.  

## 2013-02-03 NOTE — Discharge Summary (Signed)
Physician Discharge Summary  [name] WNI:[id number] DOB: 02-22-1950 DOA: 02/02/2013  PCP: Deborra Medina, MD  Admit date: 02/02/2013 Discharge date: 02/03/2013  Time spent: 35 minutes  Recommendations for Outpatient Follow-up:  1. Need repeat TSH level, adjust synthroid as needed.  2. Needs to follow up with cardio for further evaluation after placement of loop recorder and near syncope.  3. Follow up with cardio for Prolong QT.   Discharge Diagnoses:    Near syncope   Headache   Amnesia memory loss   Hypokalemia   Prolonged QT interval   Discharge Condition: Stable.  Diet recommendation: Heart Healthy.  Filed Weights   02/02/13 1622  Weight: 73.074 kg (161 lb 1.6 oz)    History of present illness:  Christine Hull is a 63 y.o. female with PMH significant for hypothyroidism, Hypertension who presents to RD complaining of headache that started the morning of admission. She relates pain frontal area, 4/10 radiates to frontal area. She denies focal weakness, slurred speech. She notice this morning lightheaded , dizziness when she stand up. She report a fall 1 week ago. She was walking in the kitchen when she lean forward for no reason and next thing she fell. That day she had some confusion. She forgot that she fall.  Daughter also notice short term memory problems for last 9 months.  Patient denies chest pain, dyspnea, abdominal pain, diarrhea, nausea vomiting.  In the ED she was found to have prolong QT, low potassium. Cardiology has already seen the patient.    Hospital Course:  1- Near syncope: Patient relates lightheaded. She was admitted  to telemetry. Troponin times 3 negative.  Orthostatic vitals negative. She was evaluated by cardio, she underwent loop recorder placement.   2-Headache, memory loss.  MRI negative for acute stroke. B 12 at 472, RPR negative, TSH increased. She will need repeat TSH. This could be elevated in setting of acute illness.  Need evaluation  for dementia.  3-Prolong QT; . Check Mg level at 1.9. Potasium replaced. She underwent Myoview which was negative for ischemia and placement of loop recorder. She needs to follow up with cardio.  4-Hypothyroidism: continue with synthroid. TSH elevated. This will need to be repeated.    Procedures:  Loop recorder.   ECHO;  Myoview: negative for ischemia.   Consultations:  Cardiology  Discharge Exam: Filed Vitals:   02/03/13 1600  BP: 110/75  Pulse:   Temp: 98.3 F (36.8 C)  Resp:     General: No distress.  Cardiovascular: S 1, S 2 RRR Respiratory: CTA Neuro ; non focal.   Discharge Instructions  Discharge Orders   Future Orders Complete By Expires   Diet - low sodium heart healthy  As directed    Increase activity slowly  As directed        Medication List    STOP taking these medications       losartan-hydrochlorothiazide 50-12.5 MG per tablet  Commonly known as:  HYZAAR      TAKE these medications       fish oil-omega-3 fatty acids 1000 MG capsule  Take 1 g by mouth daily.     levothyroxine 75 MCG tablet  Commonly known as:  SYNTHROID, LEVOTHROID  Take 75 mcg by mouth daily before breakfast.     liothyronine 5 MCG tablet  Commonly known as:  CYTOMEL  Take 5 mcg by mouth daily.     nadolol 20 MG tablet  Commonly known as:  CORGARD  Take 1  tablet (20 mg total) by mouth 2 (two) times daily.     Vitamin D (Ergocalciferol) 50000 UNITS Caps capsule  Commonly known as:  DRISDOL  Take 50,000 Units by mouth every 30 (thirty) days.       No Known Allergies    The results of significant diagnostics from this hospitalization (including imaging, microbiology, ancillary and laboratory) are listed below for reference.    Significant Diagnostic Studies: Ct Head Wo Contrast  02/02/2013   CLINICAL DATA:  Fall 1 week ago; persistent left head and neck pain  EXAM: CT HEAD WITHOUT CONTRAST  CT CERVICAL SPINE WITHOUT CONTRAST  TECHNIQUE: Multidetector CT  imaging of the head and cervical spine was performed following the standard protocol without intravenous contrast. Multiplanar CT image reconstructions of the cervical spine were also generated.  COMPARISON:  The report from a remote prior CT scan of the head dated 11/28/2000 is available for review ; thyroid ultrasound 08/15/2003  FINDINGS: CT HEAD FINDINGS  Negative for acute intracranial hemorrhage, acute infarction, mass, mass effect, hydrocephalus or midline shift. Gray-white differentiation is preserved throughout.  No focal soft tissue or calvarial abnormality. No scalp hematoma. The visualized globes and orbits are intact and symmetric bilaterally. Minimal mineralization within the bilateral globus pallidum. Trace atherosclerotic calcification in the carotid siphons. Normal aeration of the mastoid air cells and visualized paranasal sinuses.  CT CERVICAL SPINE FINDINGS  No acute fracture, malalignment or prevertebral soft tissue swelling. Multilevel degenerative spondylosis of the cervical spine with the most significant areas at C4-C5, C5-C6, C6-C7 and T1-T2. There is a large posterior disc osteophyte complex at T1-T2 likely resulting in central canal stenosis. There is mild reversal of the normal cervical lordosis. Heterogeneous and enlarged appearance of the thyroid gland. Similar findings were demonstrated on the remote prior ultrasound. No acute soft tissue abnormality. The lung apices are unremarkable.  IMPRESSION: CT HEAD:  Negative.  CT C SPINE:  1. No acute fracture or malalignment. 2. Multilevel cervical spondylosis most significant at T1-T2 or a large posterior disc osteophyte complex likely results and central canal stenosis. 3. Reversal of the normal cervical lordosis favored to be degenerative in the etiology. 4. Enlarged and heterogeneous thyroid gland. Similar findings seen on remote prior thyroid ultrasound from August of 2005.   Electronically Signed   By: Jacqulynn Cadet M.D.   On:  02/02/2013 08:29   Ct Cervical Spine Wo Contrast  02/02/2013   CLINICAL DATA:  Fall 1 week ago; persistent left head and neck pain  EXAM: CT HEAD WITHOUT CONTRAST  CT CERVICAL SPINE WITHOUT CONTRAST  TECHNIQUE: Multidetector CT imaging of the head and cervical spine was performed following the standard protocol without intravenous contrast. Multiplanar CT image reconstructions of the cervical spine were also generated.  COMPARISON:  The report from a remote prior CT scan of the head dated 11/28/2000 is available for review ; thyroid ultrasound 08/15/2003  FINDINGS: CT HEAD FINDINGS  Negative for acute intracranial hemorrhage, acute infarction, mass, mass effect, hydrocephalus or midline shift. Gray-white differentiation is preserved throughout.  No focal soft tissue or calvarial abnormality. No scalp hematoma. The visualized globes and orbits are intact and symmetric bilaterally. Minimal mineralization within the bilateral globus pallidum. Trace atherosclerotic calcification in the carotid siphons. Normal aeration of the mastoid air cells and visualized paranasal sinuses.  CT CERVICAL SPINE FINDINGS  No acute fracture, malalignment or prevertebral soft tissue swelling. Multilevel degenerative spondylosis of the cervical spine with the most significant areas at C4-C5, C5-C6, C6-C7 and  T1-T2. There is a large posterior disc osteophyte complex at T1-T2 likely resulting in central canal stenosis. There is mild reversal of the normal cervical lordosis. Heterogeneous and enlarged appearance of the thyroid gland. Similar findings were demonstrated on the remote prior ultrasound. No acute soft tissue abnormality. The lung apices are unremarkable.  IMPRESSION: CT HEAD:  Negative.  CT C SPINE:  1. No acute fracture or malalignment. 2. Multilevel cervical spondylosis most significant at T1-T2 or a large posterior disc osteophyte complex likely results and central canal stenosis. 3. Reversal of the normal cervical lordosis  favored to be degenerative in the etiology. 4. Enlarged and heterogeneous thyroid gland. Similar findings seen on remote prior thyroid ultrasound from August of 2005.   Electronically Signed   By: Malachy Moan M.D.   On: 02/02/2013 08:29   Mr Brain Wo Contrast  02/02/2013   CLINICAL DATA:  Presyncope/syncope, memory problems, and headaches. Recent fall. Rule out stroke.  EXAM: MRI HEAD WITHOUT CONTRAST  TECHNIQUE: Multiplanar, multiecho pulse sequences of the brain and surrounding structures were obtained without intravenous contrast.  COMPARISON:  Head CT 02/02/2013.  FINDINGS: There is no evidence of acute infarct. There is no intracranial hemorrhage. Periventricular white matter T2 hyperintensities are compatible with minimal chronic small vessel ischemic disease. There is mild cerebral atrophy. Orbits are unremarkable. Major intracranial vascular flow voids are unremarkable. Visualized paranasal sinuses and mastoid air cells are clear.  IMPRESSION: No evidence of infarct or other acute intracranial abnormality. Minimal chronic small vessel ischemic disease and cerebral atrophy.   Electronically Signed   By: Sebastian Ache   On: 02/02/2013 15:40   Nm Myocar Multi W/spect W/wall Motion / Ef  02/03/2013   CLINICAL DATA:  Chest pain  EXAM: MYOCARDIAL IMAGING WITH SPECT (REST AND PHARMACOLOGIC-STRESS)  GATED LEFT VENTRICULAR WALL MOTION STUDY  LEFT VENTRICULAR EJECTION FRACTION  TECHNIQUE: Standard myocardial SPECT imaging was performed after resting intravenous injection of 10 mCi Tc-50m sestamibi. Subsequently, intravenous infusion of Lexiscan was performed under the supervision of the Cardiology staff. At peak effect of the drug, 30 mCi Tc-32m sestamibi was injected intravenously and standard myocardial SPECT imaging was performed. Quantitative gated imaging was also performed to evaluate left ventricular wall motion, and estimate left ventricular ejection fraction.  COMPARISON:  None.  FINDINGS: Lexiscan  stress ECG is reported in MUSE. Gated images showed normal wall motion with EF 73%. Perfusion images showed no significant perfusion defect at rest or with Lexiscan stress.  IMPRESSION: Normal Lexiscan Cardiolite study with no evidence for ischemia or infarction and normal EF/wall motion.   Electronically Signed   By: Marca Ancona M.D.   On: 02/03/2013 15:56    Microbiology: No results found for this or any previous visit (from the past 240 hour(s)).   Labs: Basic Metabolic Panel:  Recent Labs Lab 02/02/13 0825 02/02/13 1640 02/03/13 0405  NA 143  --  142  K 3.6*  --  3.7  CL 102  --  104  CO2 26  --  26  GLUCOSE 114*  --  94  BUN 11  --  8  CREATININE 0.96 0.92 0.88  CALCIUM 9.2  --  9.0  MG  --  1.9  --    Liver Function Tests:  Recent Labs Lab 02/02/13 0825  AST 23  ALT 11  ALKPHOS 96  BILITOT 0.7  PROT 7.6  ALBUMIN 3.8   No results found for this basename: LIPASE, AMYLASE,  in the last 168 hours No  results found for this basename: AMMONIA,  in the last 168 hours CBC:  Recent Labs Lab 02/02/13 0825 02/02/13 1640  WBC 7.3 7.8  NEUTROABS 5.6  --   HGB 13.2 12.6  HCT 37.6 36.6  MCV 97.9 98.9  PLT 271 273   Cardiac Enzymes:  Recent Labs Lab 02/02/13 1840 02/02/13 2344 02/03/13 0405  TROPONINI <0.30 <0.30 <0.30   BNP: BNP (last 3 results) No results found for this basename: PROBNP,  in the last 8760 hours CBG: No results found for this basename: GLUCAP,  in the last 168 hours     Signed:  Christyl Osentoski  [facility] 02/03/2013, 4:38 PM

## 2013-02-03 NOTE — Evaluation (Signed)
Physical Therapy Evaluation Patient Details Name: Christine Hull MRN: [id number] DOB: 1950/11/10 Today's Date: 02/03/2013 Time: 1225-1249 PT Time Calculation (min): 24 min  PT Assessment / Plan / Recommendation History of Present Illness  63 y.o. female admitted to [facility] on 02/02/13 with PMH significant for hypothyroidism, Hypertension who presents to ED complaining of headache that started the morning of admission. She relates pain frontal area, 4/10.  She denies focal weakness, slurred speech. She notice this morning that she was lightheaded and dizzy when she stands up. She reported a fall 1 week ago (hit the left side of her face). She was walking in the kitchen when she lean forward for no reason and next thing she knew she fell without any protective reaction of reaching her hands out. That day she had some confusion. She forgot that she fell.  Daughter also notice short term memory problems for last 9 months.   In the ED she was found to have prolong QT, low potassium. Orthostatics are negative.  Pt s/p loop recorder placement 02/03/13.    Clinical Impression  Vestibular assessment complete (occulomotor testing WNL, [place] (-) bil, VOR and gaze stability testing negative, no reports of ringing in the ears, fullness, hearing loss, recent URI, she does have recent head trauma, but no symptoms during any testing or gait) and pt is negative for vestibular pathology.  She did very well with gait (DGI completed and she scored 23/24) and stairs.  She had no symptoms like she had when she fell at home.  The pt has no acute or f/u needs at this time.     PT Assessment  Patent does not need any further PT services    Follow Up Recommendations  No PT follow up    Does the patient have the potential to tolerate intense rehabilitation     NA  Barriers to Discharge   None      Equipment Recommendations  None recommended by PT    Recommendations for Other Services   None  Frequency   NA- one  time eval and d/c   Precautions / Restrictions    None  Pertinent Vitals/Pain Ortho statics were measured earlier in her stay and were negative.       Mobility  Bed Mobility Overal bed mobility: Independent Transfers Overall transfer level: Independent Ambulation/Gait Ambulation/Gait assistance: Independent Ambulation Distance (Feet): 350 Feet Assistive device: None Gait Pattern/deviations: WFL(Within Functional Limits) Gait velocity: WFL General Gait Details: DGI preformed Stairs: Yes Stairs assistance: Modified independent (Device/Increase time) Stair Management: One rail Right;Alternating pattern;Forwards Number of Stairs: 9 General stair comments: easily completed with railing        PT Goals(Current goals can be found in the care plan section) Acute Rehab PT Goals Patient Stated Goal: to figure out why this happened.  PT Goal Formulation: No goals set, d/c therapy  Visit Information  Last PT Received On: 02/03/13 Assistance Needed: +1 History of Present Illness: 63 y.o. female admitted to [facility] on 02/02/13 with [place] significant for hypothyroidism, Hypertension who presents to ED complaining of headache that started the morning of admission. She relates pain frontal area, 4/10.  She denies focal weakness, slurred speech. She notice this morning that she was lightheaded and dizzy when she stands up. She reported a fall 1 week ago (hit the left side of her face). She was walking in the kitchen when she lean forward for no reason and next thing she knew she fell without any protective reaction of  reaching her hands out. That day she had some confusion. She forgot that she fell.  Daughter also notice short term memory problems for last 9 months.   In the ED she was found to have prolong QT, low potassium. Orthostatics are negative.  Pt s/p loop recorder placement 02/03/13.         Prior [place] expects to be discharged to:: Private  residence Living Arrangements: Spouse/significant other Available Help at Discharge: Family;Available PRN/intermittently Type of Home: House Home Access: Stairs to enter Home Layout: Two level;Able to live on main level with bedroom/bathroom Alternate Level Stairs-Number of Steps: flight Alternate Level Stairs-Rails: Right Prior Function Level of Independence: Independent Comments: Per pt she had one other episode of falling like this 10 years ago for no apparent reason.   Communication Communication: No difficulties    Cognition  Cognition Arousal/Alertness: Awake/alert Behavior During Therapy: WFL for tasks assessed/performed Overall Cognitive Status: Within Functional Limits for tasks assessed (not specifically tested.  Per chart daughter reports memory )    Extremity/Trunk Assessment Upper Extremity Assessment Upper Extremity Assessment: Overall WFL for tasks assessed Lower Extremity Assessment Lower Extremity Assessment: Overall WFL for tasks assessed Cervical / Trunk Assessment Cervical / Trunk Assessment: Normal   Balance Balance Overall balance assessment: No apparent balance deficits (not formally assessed) Standardized Balance Assessment Standardized Balance Assessment : Dynamic Gait Index Dynamic Gait Index Level Surface: Normal Change in Gait Speed: Normal Gait with Horizontal Head Turns: Normal Gait with Vertical Head Turns: Normal Gait and Pivot Turn: Normal Step Over Obstacle: Normal Step Around Obstacles: Normal Steps: Mild Impairment Total Score: 23 General Comments General comments (skin integrity, edema, etc.): Vestibular assessment completed and negative for vestibular pathology.    End of Session PT - End of Session Activity Tolerance: Patient tolerated treatment well Patient left: in bed;with call bell/phone within reach;with family/visitor present Nurse Communication: Mobility status    Wells Guiles B. Lorain Fettes, [place], DPT [phone]   02/03/2013, 1:57  PM
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
## 2013-02-03 NOTE — H&P (View-Only) (Signed)
Subjective: No complaints. Denies CP/SOB. No further syncope/ near syncope  Objective: Vital signs in last 24 hours: Temp:  [98.9 F (37.2 C)-99.7 F (37.6 C)] 99.7 F (37.6 C) (01/23 0400) Pulse Rate:  [53-88] 53 (01/23 0400) Resp:  [18-24] 19 (01/23 0400) BP: (93-122)/(58-84) 119/65 mmHg (01/23 0920) SpO2:  [94 %-100 %] 100 % (01/23 0400) Weight:  [161 lb 1.6 oz (73.074 kg)] 161 lb 1.6 oz (73.074 kg) (01/22 1622)    Intake/Output from previous day:   Intake/Output this shift:    Medications Current Facility-Administered Medications  Medication Dose Route Frequency Provider Last Rate Last Dose  . 0.9 %  sodium chloride infusion   Intravenous STAT Carmin Muskrat, MD      . 0.9 %  sodium chloride infusion   Intravenous Continuous Belkys A Regalado, MD      . acetaminophen (TYLENOL) tablet 650 mg  650 mg Oral Q6H PRN Belkys A Regalado, MD       Or  . acetaminophen (TYLENOL) suppository 650 mg  650 mg Rectal Q6H PRN Belkys A Regalado, MD      . enoxaparin (LOVENOX) injection 40 mg  40 mg Subcutaneous Q24H Belkys A Regalado, MD   40 mg at 02/02/13 2107  . folic acid (FOLVITE) tablet 1 mg  1 mg Oral Daily Belkys A Regalado, MD      . HYDROcodone-acetaminophen (NORCO/VICODIN) 5-325 MG per tablet 1-2 tablet  1-2 tablet Oral Q4H PRN Belkys A Regalado, MD      . levothyroxine (SYNTHROID, LEVOTHROID) tablet 75 mcg  75 mcg Oral QAC breakfast Belkys A Regalado, MD      . liothyronine (CYTOMEL) tablet 5 mcg  5 mcg Oral Daily Belkys A Regalado, MD   5 mcg at 02/02/13 2107  . multivitamin with minerals tablet 1 tablet  1 tablet Oral Daily Belkys A Regalado, MD      . nadolol (CORGARD) tablet 20 mg  20 mg Oral BID Evans Lance, MD   20 mg at 02/03/13 0044  . omega-3 acid ethyl esters (LOVAZA) capsule 1 g  1 g Oral Daily Belkys A Regalado, MD   1 g at 02/02/13 2108  . ondansetron (ZOFRAN) tablet 4 mg  4 mg Oral Q6H PRN Belkys A Regalado, MD       Or  . ondansetron (ZOFRAN) injection 4  mg  4 mg Intravenous Q6H PRN Belkys A Regalado, MD      . pneumococcal 23 valent vaccine (PNU-IMMUNE) injection 0.5 mL  0.5 mL Intramuscular Tomorrow-1000 Belkys A Regalado, MD      . potassium chloride SA (K-DUR,KLOR-CON) CR tablet 40 mEq  40 mEq Oral Once Belkys A Regalado, MD      . sodium chloride 0.9 % injection 3 mL  3 mL Intravenous Q12H Belkys A Regalado, MD      . Derrill Memo ON 02/12/2013] Vitamin D (Ergocalciferol) (DRISDOL) capsule 50,000 Units  50,000 Units Oral Q30 days Belkys A Regalado, MD        PE: General appearance: alert, cooperative and no distress Lungs: clear to auscultation bilaterally Heart: regular rate and rhythm and 1/6 murmur along LSB Extremities: no LEE Pulses: 2+ and symmetric Skin: warm and dry Neurologic: Grossly normal  Lab Results:   Recent Labs  02/02/13 0825 02/02/13 1640  WBC 7.3 7.8  HGB 13.2 12.6  HCT 37.6 36.6  PLT 271 273   BMET  Recent Labs  02/02/13 0825 02/02/13 1640 02/03/13 0405  NA 143  --  142  K 3.6*  --  3.7  CL 102  --  104  CO2 26  --  26  GLUCOSE 114*  --  94  BUN 11  --  8  CREATININE 0.96 0.92 0.88  CALCIUM 9.2  --  9.0     Assessment/Plan  Active Problems:   Near syncope   Headache   Amnesia memory loss   Hypokalemia   Prolonged QT interval  Plan: No recurrent symptoms. Lexiscan NST completed this am. Results pending. Radiologist interpretation to follow. Pt tolerated the test well. She had slight downward sloping ST depressions in lead III, but no other changes. Appropriate BP and HR response. She had mild abdominal discomfort after infusion of Lexiscan, however symptoms resolved by completion of test. Continue to monitor for now. Additional plans will depend on test results.  MD to follow.     LOS: 1 day    Jazyah Butsch M. Iann Rodier, PA-C 02/03/2013 9:54 AM   

## 2013-02-15 ENCOUNTER — Ambulatory Visit: Payer: 59

## 2013-02-15 ENCOUNTER — Other Ambulatory Visit: Payer: Self-pay | Admitting: Internal Medicine

## 2013-02-15 NOTE — Telephone Encounter (Signed)
Rx'd by another physician, refill?

## 2013-02-16 MED ORDER — NADOLOL 20 MG PO TABS: ORAL_TABLET | ORAL | Status: DC

## 2013-02-16 NOTE — Addendum Note (Signed)
Addended by: Wynonia Lawman E on: 02/16/2013 11:29 AM   Modules accepted: Orders

## 2013-02-23 ENCOUNTER — Ambulatory Visit (INDEPENDENT_AMBULATORY_CARE_PROVIDER_SITE_OTHER): Payer: BC Managed Care – PPO | Admitting: *Deleted

## 2013-02-23 DIAGNOSIS — R55 Syncope and collapse: Secondary | ICD-10-CM

## 2013-02-23 LAB — MDC_IDC_ENUM_SESS_TYPE_INCLINIC

## 2013-02-23 NOTE — Progress Notes (Signed)
Wound check for ILR.  Wound well healed without redness or edema.  No episodes noted.  The patient c/o dizziness today --b/p 173/97.  Hyzaar 50/125mg  restarted per Dr. Caryl Comes take 1/2 po daily.  ROV in April with Dr. Caryl Comes.

## 2013-03-06 ENCOUNTER — Ambulatory Visit (INDEPENDENT_AMBULATORY_CARE_PROVIDER_SITE_OTHER): Payer: BC Managed Care – PPO | Admitting: *Deleted

## 2013-03-06 DIAGNOSIS — R55 Syncope and collapse: Secondary | ICD-10-CM

## 2013-03-10 ENCOUNTER — Telehealth: Payer: Self-pay | Admitting: Internal Medicine

## 2013-03-10 ENCOUNTER — Telehealth: Payer: Self-pay | Admitting: *Deleted

## 2013-03-10 MED ORDER — NADOLOL 20 MG PO TABS: ORAL_TABLET | ORAL | Status: DC

## 2013-03-10 NOTE — Telephone Encounter (Signed)
Patient had called for refill on BP meds while in conversation found out patient was recently discharged from hospital , followed hospital protocol and advised patient needs hospital follow up scheduled for 03/28/13 , will send for discharge summary  Patient admitted for increased BP and bradycardia FYI.

## 2013-03-10 NOTE — Telephone Encounter (Signed)
Need fore information ,  I cannot refill her nadolol if she was admitted with bradycardia.  They may have changed her BP medication because of this. She should have gotten a discharge summary and discharge medications

## 2013-03-10 NOTE — Telephone Encounter (Signed)
DC summary from [facility] confirms that hernadolol was continued at discharge,    Lisinopril hct discontinued.  Refill on nadolol sent

## 2013-03-10 NOTE — Telephone Encounter (Signed)
Refillsent patient notified,

## 2013-03-10 NOTE — Telephone Encounter (Signed)
Not if they told her to take it, you can refill it for 30 days pending hospital follow up

## 2013-03-10 NOTE — Telephone Encounter (Signed)
Patient stated she was discharged on this medication. Will send for discharge summary to confirm. Patient has been taking since discharge should I call and have patient stop until we get confirmation.

## 2013-03-21 ENCOUNTER — Other Ambulatory Visit: Payer: Self-pay | Admitting: Internal Medicine

## 2013-03-23 ENCOUNTER — Other Ambulatory Visit: Payer: Self-pay | Admitting: Internal Medicine

## 2013-03-28 ENCOUNTER — Ambulatory Visit (INDEPENDENT_AMBULATORY_CARE_PROVIDER_SITE_OTHER): Payer: BC Managed Care – PPO | Admitting: Internal Medicine

## 2013-03-28 ENCOUNTER — Encounter: Payer: Self-pay | Admitting: Internal Medicine

## 2013-03-28 ENCOUNTER — Other Ambulatory Visit: Payer: Self-pay | Admitting: Internal Medicine

## 2013-03-28 VITALS — BP 148/80 | HR 51 | Temp 98.2°F | Resp 18 | Wt 162.2 lb

## 2013-03-28 DIAGNOSIS — E039 Hypothyroidism, unspecified: Secondary | ICD-10-CM

## 2013-03-28 DIAGNOSIS — E559 Vitamin D deficiency, unspecified: Secondary | ICD-10-CM

## 2013-03-28 DIAGNOSIS — F17200 Nicotine dependence, unspecified, uncomplicated: Secondary | ICD-10-CM

## 2013-03-28 DIAGNOSIS — I10 Essential (primary) hypertension: Secondary | ICD-10-CM

## 2013-03-28 DIAGNOSIS — E669 Obesity, unspecified: Secondary | ICD-10-CM

## 2013-03-28 DIAGNOSIS — Z720 Tobacco use: Secondary | ICD-10-CM

## 2013-03-28 DIAGNOSIS — E876 Hypokalemia: Secondary | ICD-10-CM

## 2013-03-28 DIAGNOSIS — R9431 Abnormal electrocardiogram [ECG] [EKG]: Secondary | ICD-10-CM

## 2013-03-28 NOTE — Progress Notes (Signed)
Patient ID: Christine Hull, female   DOB: 1950/02/01, 63 y.o.   MRN: [id number]  Patient Active Problem List   Diagnosis Date Noted  . Obesity, unspecified 04/01/2013  . Unspecified essential hypertension 04/01/2013  . Near syncope 02/02/2013  . Headache 02/02/2013  . Amnesia memory loss 02/02/2013  . Hypokalemia 02/02/2013  . Prolonged QT interval 02/02/2013  . Routine general medical examination at a health care facility 11/17/2012  . Chronic back pain greater than 3 months duration 08/16/2011  . Unspecified hypothyroidism 08/14/2011  . Tobacco abuse 08/13/2011    Subjective:  CC:   Chief Complaint  Patient presents with  . Follow-up    hospital    HPI:   Christine Hull is a 63 y.o. female who presents for Follow up on recent events.  She was hospitalized for long QT syndrome after having an unwitnessed  syncopal event at home in [facility] January .  She was treated by Virl Axe, and a loop recorder implanted and was started on Nadolol 20 mg twice daily which caused dizziness and light headedness day   Htn: she has been taking losartan hct 50/12.5  Half tablet since discharge .  Has not checked  Bps  She has been walking for an hour every other day on a treadmill without chest pain, dyspnea, or recurrent syncope.   Past Medical History  Diagnosis Date  . Heart murmur   . Hypertension   . Hypothyroidism     Past Surgical History  Procedure Laterality Date  . Breast biopsy Right 2011    Sankar,  normal  . Augmentation mammaplasty Bilateral 1990's    "had implants put in; had them taken out 6 months later" (02/02/2013)       The following portions of the patient's history were reviewed and updated as appropriate: Allergies, current medications, and problem list.    Review of Systems:   Patient denies headache, fevers, malaise, unintentional weight loss, skin rash, eye pain, sinus congestion and sinus pain, sore throat, dysphagia,  hemoptysis , cough,  dyspnea, wheezing, chest pain, palpitations, orthopnea, edema, abdominal pain, nausea, melena, diarrhea, constipation, flank pain, dysuria, hematuria, urinary  Frequency, nocturia, numbness, tingling, seizures,  Focal weakness, Loss of consciousness,  Tremor, insomnia, depression, anxiety, and suicidal ideation.     History   Social History  . Marital Status: Married    Spouse Name: N/A    Number of Children: N/A  . Years of Education: N/A   Occupational History  . Not on file.   Social History Main Topics  . Smoking status: Current Every Day Smoker -- 0.50 packs/day for 41 years    Types: Cigarettes  . Smokeless tobacco: Never Used  . Alcohol Use: 3.0 oz/week    5 Shots of liquor per week     Comment: 02/02/2013 "drink a shot  in my coffee ~ nightly before bed"  . Drug Use: No  . Sexual Activity: Not Currently   Other Topics Concern  . Not on file   Social History Narrative  . No narrative on file    Objective:  Filed Vitals:   03/28/13 1445  BP: 148/80  Pulse: 51  Temp: 98.2 F (36.8 C)  Resp: 18     General appearance: alert, cooperative and appears stated age Ears: normal TM's and external ear canals both ears Throat: lips, mucosa, and tongue normal; teeth and gums normal Neck: no adenopathy, no carotid bruit, supple, symmetrical, trachea midline and thyroid not enlarged, symmetric, no  tenderness/mass/nodules Back: symmetric, no curvature. ROM normal. No CVA tenderness. Lungs: clear to auscultation bilaterally Heart: regular rate and rhythm, S1, S2 normal, no murmur, click, rub or gallop Abdomen: soft, non-tender; bowel sounds normal; no masses,  no organomegaly Pulses: 2+ and symmetric Skin: Skin color, texture, turgor normal. No rashes or lesions Lymph nodes: Cervical, supraclavicular, and axillary nodes normal.  Assessment and Plan:  Tobacco abuse Smoking cessation instruction/counseling given:  counseled patient on the dangers of tobacco use, advised  patient to stop smoking, and reviewed strategies to maximize success  Obesity, unspecified I have addressed  BMI and recommended wt loss of 10% of body weight over the next 6 months using a low glycemic index diet and regular exercise a minimum of 5 days per week.    Prolonged QT interval With hypokalemia and syncope at presentation mid January .  S/p normal myoview, implantable loop recorder   by Jolyn Nap,  On nadolol for control.   Unspecified essential hypertension Elevated today.  Reviewed list of meds, patient is not taking OTC meds that could be causing,. It.  Have asked patient to recheck bp at home a minimum of 5 times over the next 4 weeks and call readings to office for adjustment of medications.     Updated Medication List Outpatient Encounter Prescriptions as of 03/28/2013  Medication Sig  . fish oil-omega-3 fatty acids 1000 MG capsule Take 1 g by mouth daily.   Marland Kitchen levothyroxine (SYNTHROID, LEVOTHROID) 75 MCG tablet Take 75 mcg by mouth daily before breakfast.  . liothyronine (CYTOMEL) 5 MCG tablet Take 5 mcg by mouth daily.  . nadolol (CORGARD) 20 MG tablet TAKE 1 TABLET BY MOUTH TWICE A DAY  . Vitamin D, Ergocalciferol, (DRISDOL) 50000 UNITS CAPS capsule Take 50,000 Units by mouth every 30 (thirty) days.     Orders Placed This Encounter  Procedures  . TSH  . Comprehensive metabolic panel  . Magnesium  . Vit D  25 hydroxy (rtn osteoporosis monitoring)    No Follow-up on file.

## 2013-03-28 NOTE — Progress Notes (Signed)
Pre-visit discussion using our clinic review tool. No additional management support is needed unless otherwise documented below in the visit note.  

## 2013-03-28 NOTE — Patient Instructions (Addendum)
Check your  BP 3 times over the next week  If your bp is  Consistently > 145/85,  Increase the losartan to full tablet daily   The nadolol is controlled your heart rate and keeping you from fainting  We will call you with the results of your labs from today

## 2013-03-29 LAB — COMPREHENSIVE METABOLIC PANEL
ALBUMIN: 3.9 g/dL (ref 3.5–5.2)
ALK PHOS: 74 U/L (ref 39–117)
ALT: 11 U/L (ref 0–35)
AST: 16 U/L (ref 0–37)
BUN: 19 mg/dL (ref 6–23)
CO2: 27 mEq/L (ref 19–32)
Calcium: 9.4 mg/dL (ref 8.4–10.5)
Chloride: 105 mEq/L (ref 96–112)
Creatinine, Ser: 1.1 mg/dL (ref 0.4–1.2)
GFR: 63.1 mL/min (ref >60.00)
Glucose, Bld: 90 mg/dL (ref 70–99)
Potassium: 4.5 mEq/L (ref 3.5–5.1)
Sodium: 140 mEq/L (ref 135–145)
Total Bilirubin: 0.8 mg/dL (ref 0.3–1.2)
Total Protein: 7 g/dL (ref 6.0–8.3)

## 2013-03-29 LAB — MAGNESIUM: MAGNESIUM: 2 mg/dL (ref 1.5–2.5)

## 2013-03-30 ENCOUNTER — Encounter: Payer: Self-pay | Admitting: *Deleted

## 2013-03-30 LAB — TSH: TSH: 0.921 u[IU]/mL (ref 0.350–4.500)

## 2013-03-30 LAB — VITAMIN D 25 HYDROXY (VIT D DEFICIENCY, FRACTURES): VIT D 25 HYDROXY: 64 ng/mL (ref 30–89)

## 2013-04-01 ENCOUNTER — Encounter: Payer: Self-pay | Admitting: Internal Medicine

## 2013-04-01 DIAGNOSIS — I10 Essential (primary) hypertension: Secondary | ICD-10-CM | POA: Insufficient documentation

## 2013-04-01 DIAGNOSIS — E669 Obesity, unspecified: Secondary | ICD-10-CM | POA: Insufficient documentation

## 2013-04-01 NOTE — Assessment & Plan Note (Signed)
Elevated today.  Reviewed list of meds, patient is not taking OTC meds that could be causing,. It.  Have asked patient to recheck bp at home a minimum of 5 times over the next 4 weeks and call readings to office for adjustment of medications.   

## 2013-04-01 NOTE — Assessment & Plan Note (Addendum)
With hypokalemia and syncope at presentation mid January .  S/p normal myoview, implantable loop recorder   by Jolyn Nap,  On nadolol for control.

## 2013-04-01 NOTE — Assessment & Plan Note (Signed)
I have addressed  BMI and recommended wt loss of 10% of body weight over the next 6 months using a low glycemic index diet and regular exercise a minimum of 5 days per week.   

## 2013-04-01 NOTE — Assessment & Plan Note (Signed)
Smoking cessation instruction/counseling given:  counseled patient on the dangers of tobacco use, advised patient to stop smoking, and reviewed strategies to maximize success 

## 2013-04-04 LAB — MDC_IDC_ENUM_SESS_TYPE_REMOTE

## 2013-04-06 ENCOUNTER — Ambulatory Visit (INDEPENDENT_AMBULATORY_CARE_PROVIDER_SITE_OTHER): Payer: BC Managed Care – PPO | Admitting: *Deleted

## 2013-04-06 DIAGNOSIS — R55 Syncope and collapse: Secondary | ICD-10-CM

## 2013-04-06 LAB — MDC_IDC_ENUM_SESS_TYPE_REMOTE

## 2013-04-14 ENCOUNTER — Encounter: Payer: Self-pay | Admitting: *Deleted

## 2013-05-08 ENCOUNTER — Ambulatory Visit (INDEPENDENT_AMBULATORY_CARE_PROVIDER_SITE_OTHER): Payer: BC Managed Care – PPO | Admitting: *Deleted

## 2013-05-08 DIAGNOSIS — R55 Syncope and collapse: Secondary | ICD-10-CM

## 2013-05-08 LAB — MDC_IDC_ENUM_SESS_TYPE_REMOTE

## 2013-05-09 ENCOUNTER — Encounter: Payer: Self-pay | Admitting: Internal Medicine

## 2013-05-09 ENCOUNTER — Ambulatory Visit (INDEPENDENT_AMBULATORY_CARE_PROVIDER_SITE_OTHER): Payer: BC Managed Care – PPO | Admitting: Internal Medicine

## 2013-05-09 VITALS — BP 136/82 | HR 51 | Ht 61.0 in | Wt 160.0 lb

## 2013-05-09 DIAGNOSIS — Z959 Presence of cardiac and vascular implant and graft, unspecified: Secondary | ICD-10-CM | POA: Insufficient documentation

## 2013-05-09 DIAGNOSIS — R9431 Abnormal electrocardiogram [ECG] [EKG]: Secondary | ICD-10-CM

## 2013-05-09 DIAGNOSIS — R001 Bradycardia, unspecified: Secondary | ICD-10-CM

## 2013-05-09 DIAGNOSIS — R55 Syncope and collapse: Secondary | ICD-10-CM

## 2013-05-09 HISTORY — DX: Bradycardia, unspecified: R00.1

## 2013-05-09 HISTORY — DX: Presence of cardiac and vascular implant and graft, unspecified: Z95.9

## 2013-05-09 MED ORDER — NADOLOL 20 MG PO TABS: 20.0000 mg | ORAL_TABLET | Freq: Every day | ORAL | Status: DC

## 2013-05-09 NOTE — Progress Notes (Signed)
      Patient Care Team: Crecencio Mc, MD as PCP - General (Internal Medicine)   HPI  Christine Hull is a 63 y.o. female Seen in followup for loop recorder implanted for syncope in the setting of an abnormal ECG concerning but not diagnostic of QT prolongation  She's had no recurrent syncope. She's had no problems with exercise tolerance. She does note however, when she stands, following her morning Corgard, that she is dizzy and lightheaded.  Past Medical History  Diagnosis Date  . Heart murmur   . Hypertension   . Hypothyroidism   . Prolonged QT interval 02/02/2013  . Near syncope 02/02/2013    Past Surgical History  Procedure Laterality Date  . Breast biopsy Right 2011    Sankar,  normal  . Augmentation mammaplasty Bilateral 1990's    "had implants put in; had them taken out 6 months later" (02/02/2013)    Current Outpatient Prescriptions  Medication Sig Dispense Refill  . fish oil-omega-3 fatty acids 1000 MG capsule Take 1 g by mouth daily.       Marland Kitchen levothyroxine (SYNTHROID, LEVOTHROID) 75 MCG tablet Take 75 mcg by mouth daily before breakfast.      . liothyronine (CYTOMEL) 5 MCG tablet Take 5 mcg by mouth daily.      . nadolol (CORGARD) 20 MG tablet TAKE 1 TABLET BY MOUTH TWICE A DAY  60 tablet  3  . Vitamin D, Ergocalciferol, (DRISDOL) 50000 UNITS CAPS capsule Take 50,000 Units by mouth every 30 (thirty) days.       No current facility-administered medications for this visit.    No Known Allergies  Review of Systems negative except from HPI and PMH  Physical Exam BP 136/82  Pulse 51  Ht 5\' 1"  (1.549 m)  Wt 160 lb (72.576 kg)  BMI 30.25 kg/m2 Well developed and well nourished in no acute distress HENT normal E scleral and icterus clear Neck Supple JVP flat; carotids brisk and full Clear to ausculation  Regular rate and rhythm, no murmurs gallops or rub Soft with active bowel sounds No clubbing cyanosis  Edema Alert and oriented, grossly normal motor  and sensory function Skin Warm and Dry  ECG sinus rhythm at 45 intervals 17/08/47  Assessment and  Plan  Syncope  Sinus bradycardia  QT prolongation  Orthostatic dizziness on Corgard  Implantable loop recorder-LINQ  Tobacco abuse  Device was interrogated and showed no events. We will decrease her nadolol from twice a day to each bedtime.  Last potassium level is 4.5  With her bradycardia orthostatic intolerance, we will decrease her nadolol from twice a day--each bedtime.  She is worth stopping smoking.

## 2013-05-09 NOTE — Patient Instructions (Addendum)
Your physician has recommended you make the following change in your medication:  1) DECREASE Nadolol to 20 mg at bedtime   Your physician wants you to follow-up in January 2016 with Dr. Caryl Comes.  You will receive a reminder letter in the mail two months in advance. If you don't receive a letter, please call our office to schedule the follow-up appointment.

## 2013-05-11 LAB — MDC_IDC_ENUM_SESS_TYPE_INCLINIC

## 2013-05-21 ENCOUNTER — Other Ambulatory Visit: Payer: Self-pay | Admitting: Internal Medicine

## 2013-05-29 ENCOUNTER — Encounter: Payer: Self-pay | Admitting: Internal Medicine

## 2013-06-07 ENCOUNTER — Ambulatory Visit (INDEPENDENT_AMBULATORY_CARE_PROVIDER_SITE_OTHER): Payer: BC Managed Care – PPO

## 2013-06-07 DIAGNOSIS — R55 Syncope and collapse: Secondary | ICD-10-CM

## 2013-06-08 ENCOUNTER — Encounter: Payer: Self-pay | Admitting: Internal Medicine

## 2013-06-11 ENCOUNTER — Other Ambulatory Visit: Payer: Self-pay | Admitting: Internal Medicine

## 2013-06-12 ENCOUNTER — Other Ambulatory Visit: Payer: Self-pay | Admitting: *Deleted

## 2013-07-07 ENCOUNTER — Ambulatory Visit (INDEPENDENT_AMBULATORY_CARE_PROVIDER_SITE_OTHER): Payer: BC Managed Care – PPO | Admitting: *Deleted

## 2013-07-07 DIAGNOSIS — R55 Syncope and collapse: Secondary | ICD-10-CM

## 2013-07-09 ENCOUNTER — Other Ambulatory Visit: Payer: Self-pay | Admitting: Internal Medicine

## 2013-07-11 ENCOUNTER — Encounter: Payer: Self-pay | Admitting: *Deleted

## 2013-07-11 NOTE — Progress Notes (Signed)
Loop recorder 

## 2013-07-28 ENCOUNTER — Ambulatory Visit (INDEPENDENT_AMBULATORY_CARE_PROVIDER_SITE_OTHER): Payer: BC Managed Care – PPO | Admitting: *Deleted

## 2013-07-28 DIAGNOSIS — R55 Syncope and collapse: Secondary | ICD-10-CM

## 2013-07-31 ENCOUNTER — Other Ambulatory Visit: Payer: Self-pay | Admitting: *Deleted

## 2013-07-31 MED ORDER — LEVOTHYROXINE SODIUM 75 MCG PO TABS: 75.0000 ug | ORAL_TABLET | Freq: Every day | ORAL | Status: DC

## 2013-07-31 MED ORDER — LIOTHYRONINE SODIUM 5 MCG PO TABS: 5.0000 ug | ORAL_TABLET | Freq: Every day | ORAL | Status: DC

## 2013-08-03 ENCOUNTER — Other Ambulatory Visit: Payer: Self-pay | Admitting: *Deleted

## 2013-08-03 MED ORDER — LOSARTAN POTASSIUM-HCTZ 50-12.5 MG PO TABS: 1.0000 | ORAL_TABLET | Freq: Every day | ORAL | Status: DC

## 2013-08-09 NOTE — Progress Notes (Signed)
Loop recorder 

## 2013-09-01 LAB — MDC_IDC_ENUM_SESS_TYPE_REMOTE

## 2013-09-06 ENCOUNTER — Ambulatory Visit (INDEPENDENT_AMBULATORY_CARE_PROVIDER_SITE_OTHER): Payer: BC Managed Care – PPO | Admitting: *Deleted

## 2013-09-06 DIAGNOSIS — R55 Syncope and collapse: Secondary | ICD-10-CM

## 2013-09-06 LAB — MDC_IDC_ENUM_SESS_TYPE_REMOTE

## 2013-09-14 NOTE — Progress Notes (Signed)
Loop recorder 

## 2013-09-18 LAB — MDC_IDC_ENUM_SESS_TYPE_REMOTE

## 2013-09-19 ENCOUNTER — Encounter: Payer: Self-pay | Admitting: Internal Medicine

## 2013-09-22 LAB — MDC_IDC_ENUM_SESS_TYPE_REMOTE

## 2013-10-06 ENCOUNTER — Ambulatory Visit (INDEPENDENT_AMBULATORY_CARE_PROVIDER_SITE_OTHER): Admitting: *Deleted

## 2013-10-06 DIAGNOSIS — R55 Syncope and collapse: Secondary | ICD-10-CM

## 2013-10-06 LAB — MDC_IDC_ENUM_SESS_TYPE_REMOTE

## 2013-10-20 NOTE — Progress Notes (Signed)
Loop recorder 

## 2013-10-27 ENCOUNTER — Encounter: Payer: Self-pay | Admitting: Internal Medicine

## 2013-10-30 ENCOUNTER — Encounter: Payer: Self-pay | Admitting: Internal Medicine

## 2013-10-31 ENCOUNTER — Encounter: Payer: Self-pay | Admitting: Internal Medicine

## 2013-11-03 ENCOUNTER — Other Ambulatory Visit: Payer: Self-pay | Admitting: Internal Medicine

## 2013-11-06 ENCOUNTER — Ambulatory Visit (INDEPENDENT_AMBULATORY_CARE_PROVIDER_SITE_OTHER): Admitting: *Deleted

## 2013-11-06 DIAGNOSIS — R55 Syncope and collapse: Secondary | ICD-10-CM

## 2013-11-06 LAB — MDC_IDC_ENUM_SESS_TYPE_REMOTE

## 2013-11-10 NOTE — Progress Notes (Signed)
Loop recorder 

## 2013-11-11 ENCOUNTER — Encounter (HOSPITAL_COMMUNITY): Payer: Self-pay | Admitting: Emergency Medicine

## 2013-11-11 ENCOUNTER — Emergency Department (HOSPITAL_COMMUNITY)
Admission: EM | Admit: 2013-11-11 | Discharge: 2013-11-11 | Disposition: A | Discharge status: Home / Self Care | Attending: Emergency Medicine | Admitting: Emergency Medicine

## 2013-11-11 ENCOUNTER — Emergency Department (HOSPITAL_COMMUNITY)

## 2013-11-11 DIAGNOSIS — R51 Headache: Secondary | ICD-10-CM | POA: Diagnosis present

## 2013-11-11 DIAGNOSIS — Z720 Tobacco use: Secondary | ICD-10-CM | POA: Insufficient documentation

## 2013-11-11 DIAGNOSIS — Z79899 Other long term (current) drug therapy: Secondary | ICD-10-CM | POA: Insufficient documentation

## 2013-11-11 DIAGNOSIS — I10 Essential (primary) hypertension: Secondary | ICD-10-CM | POA: Insufficient documentation

## 2013-11-11 DIAGNOSIS — R519 Headache, unspecified: Secondary | ICD-10-CM

## 2013-11-11 DIAGNOSIS — E039 Hypothyroidism, unspecified: Secondary | ICD-10-CM | POA: Diagnosis not present

## 2013-11-11 DIAGNOSIS — R42 Dizziness and giddiness: Secondary | ICD-10-CM | POA: Diagnosis not present

## 2013-11-11 DIAGNOSIS — R011 Cardiac murmur, unspecified: Secondary | ICD-10-CM | POA: Insufficient documentation

## 2013-11-11 LAB — URINALYSIS, ROUTINE W REFLEX MICROSCOPIC
Bilirubin Urine: NEGATIVE
Glucose, UA: NEGATIVE mg/dL
Hgb urine dipstick: NEGATIVE
Ketones, ur: NEGATIVE mg/dL
Leukocytes, UA: NEGATIVE
Nitrite: NEGATIVE
Protein, ur: NEGATIVE mg/dL
Specific Gravity, Urine: 1.005 (ref 1.005–1.030)
Urobilinogen, UA: 0.2 mg/dL (ref 0.0–1.0)
pH: 7 (ref 5.0–8.0)

## 2013-11-11 LAB — BASIC METABOLIC PANEL
Anion gap: 13 (ref 5–15)
BUN: 14 mg/dL (ref 6–23)
CHLORIDE: 99 meq/L (ref 96–112)
CO2: 28 mEq/L (ref 19–32)
CREATININE: 0.98 mg/dL (ref 0.50–1.10)
Calcium: 9.8 mg/dL (ref 8.4–10.5)
GFR, EST AFRICAN AMERICAN: 69 mL/min — AB (ref >90)
GFR, EST NON AFRICAN AMERICAN: 60 mL/min — AB (ref >90)
Glucose, Bld: 99 mg/dL (ref 70–99)
Potassium: 4.5 mEq/L (ref 3.7–5.3)
Sodium: 140 mEq/L (ref 137–147)

## 2013-11-11 LAB — CBC
HEMATOCRIT: 39.8 % (ref 36.0–46.0)
Hemoglobin: 13.5 g/dL (ref 12.0–15.0)
MCH: 33.2 pg (ref 26.0–34.0)
MCHC: 33.9 g/dL (ref 30.0–36.0)
MCV: 97.8 fL (ref 78.0–100.0)
Platelets: 301 10*3/uL (ref 150–400)
RBC: 4.07 MIL/uL (ref 3.87–5.11)
RDW: 12.7 % (ref 11.5–15.5)
WBC: 5.3 10*3/uL (ref 4.0–10.5)

## 2013-11-11 MED ORDER — DIPHENHYDRAMINE HCL 50 MG/ML IJ SOLN
12.5000 mg | Freq: Once | INTRAMUSCULAR | Status: AC
  Administered 2013-11-11: 12.5 mg via INTRAVENOUS
  Filled 2013-11-11: qty 1

## 2013-11-11 MED ORDER — SODIUM CHLORIDE 0.9 % IV BOLUS (SEPSIS)
1000.0000 mL | Freq: Once | INTRAVENOUS | Status: AC
  Administered 2013-11-11: 1000 mL via INTRAVENOUS

## 2013-11-11 MED ORDER — PROCHLORPERAZINE EDISYLATE 5 MG/ML IJ SOLN
10.0000 mg | Freq: Once | INTRAMUSCULAR | Status: AC
  Administered 2013-11-11: 10 mg via INTRAVENOUS
  Filled 2013-11-11: qty 2

## 2013-11-11 MED ORDER — KETOROLAC TROMETHAMINE 30 MG/ML IJ SOLN
30.0000 mg | Freq: Once | INTRAMUSCULAR | Status: AC
  Administered 2013-11-11: 30 mg via INTRAVENOUS
  Filled 2013-11-11: qty 1

## 2013-11-11 NOTE — ED Notes (Signed)
Patient transported to CT 

## 2013-11-11 NOTE — ED Notes (Signed)
Pt undressed, in gown, on monitor, continuous pulse oximetry and blood pressure cuff; EKG performed and family at bedside

## 2013-11-11 NOTE — Discharge Instructions (Signed)
Follow-up with your primary care doctor for a recheck.  Return here for any worsening in your condition.  Increase her fluid intake and rest as much as possible

## 2013-11-11 NOTE — ED Notes (Addendum)
Head feels tight; duration x 2 days. Laying down makes it feel worse. Has been feeling quite dizzy.

## 2013-11-11 NOTE — ED Provider Notes (Signed)
CSN: [id number]     Arrival date & time 11/11/13  1235 History   First MD Initiated Contact with Patient 11/11/13 1300     Chief Complaint  Patient presents with  . Headache  . Dizziness     (Consider location/radiation/quality/duration/timing/severity/associated sxs/prior Treatment) HPI Patient presents to the emergency department with headache that started around 2 AM the patient states she felt a headache over her entire head, felt like a pressure sensation.  She states she did not have any room spinning or dizziness, but was noted in the nursing note.  The patient states that he felt like a bandlike sensation.  Patient states since that time, her headache has improved and is now mainly on the right lateral aspect of her head.  Patient states she's had no difficulty walking, chest pain, shortness of breath, weakness, numbness, dizziness, back pain, neck pain, near syncope, rash, fever, abdominal pain, nausea, vomiting or diarrhea.  Patient states that she has had some headaches in the past, but nothing that's been consistent. Past Medical History  Diagnosis Date  . Heart murmur   . Hypertension   . Hypothyroidism   . Prolonged QT interval 02/02/2013  . Near syncope 02/02/2013  . [employer]onic LINQ 05/09/2013  . Sinus bradycardia 05/09/2013   Past Surgical History  Procedure Laterality Date  . Breast biopsy Right 2011    Sankar,  normal  . Augmentation mammaplasty Bilateral 1990's    "had implants put in; had them taken out 6 months later" (02/02/2013)   Family History  Problem Relation Age of Onset  . Early death Mother     stroke resulting from prior head injury  . Stroke Mother   . Early death Father     MVA  . Cancer Maternal Aunt     stomach cancer   History  Substance Use Topics  . Smoking status: Current Every Day Smoker -- 0.50 packs/day for 41 years    Types: Cigarettes  . Smokeless tobacco: Never Used  . Alcohol Use: 3.0 oz/week    5 Shots of liquor per week      Comment: 02/02/2013 "drink a shot  in my coffee ~ nightly before bed"   OB History   Grav Para Term Preterm Abortions TAB SAB Ect Mult Living                 Review of Systems  All other systems negative except as documented in the HPI. All pertinent positives and negatives as reviewed in the HPI.  Allergies  Review of patient's allergies indicates no known allergies.  Home Medications   Prior to Admission medications   Medication Sig Start Date End Date Taking? Authorizing Provider  fish oil-omega-3 fatty acids 1000 MG capsule Take 1 g by mouth daily.    Yes Historical Provider, MD  levothyroxine (SYNTHROID, LEVOTHROID) 75 MCG tablet Take 75 mcg by mouth daily before breakfast. 07/31/13  Yes Crecencio Mc, MD  liothyronine (CYTOMEL) 5 MCG tablet Take 1 tablet (5 mcg total) by mouth daily. 07/31/13  Yes Crecencio Mc, MD  losartan-hydrochlorothiazide (HYZAAR) 50-12.5 MG per tablet Take 1 tablet by mouth daily. 08/03/13  Yes Crecencio Mc, MD  nadolol (CORGARD) 20 MG tablet TAKE 1 TABLET BY MOUTH TWICE A DAY 11/06/13  Yes Crecencio Mc, MD  Vitamin D, Ergocalciferol, (DRISDOL) 50000 UNITS CAPS capsule Take 50,000 Units by mouth every 30 (thirty) days.   Yes Historical Provider, MD   BP 123/72  Pulse 50  Temp(Src) 98 F (36.7 C) (Oral)  Resp 16  SpO2 99% Physical Exam  Nursing note and vitals reviewed. Constitutional: She is oriented to person, place, and time. She appears well-developed and well-nourished. No distress.  HENT:  Head: Normocephalic and atraumatic.  Mouth/Throat: Oropharynx is clear and moist.  Eyes: EOM are normal. Pupils are equal, round, and reactive to light.  Neck: Normal range of motion. Neck supple.  Cardiovascular: Normal rate, regular rhythm and normal heart sounds.  Exam reveals no gallop and no friction rub.   No murmur heard. Pulmonary/Chest: Effort normal and breath sounds normal. No respiratory distress.  Neurological: She is alert and oriented to  person, place, and time. She has normal strength. No cranial nerve deficit or sensory deficit. She exhibits normal muscle tone. Coordination and gait normal. GCS eye subscore is 4. GCS verbal subscore is 5. GCS motor subscore is 6.  Patient has normal finger to nose and heel to shin testing.  She has normal strength in all 4 extremities and normal sensation  Skin: Skin is warm and dry.    ED Course  Procedures (including critical care time) Labs Review Labs Reviewed  BASIC METABOLIC PANEL - Abnormal; Notable for the following:    GFR calc non Af Amer 60 (*)    GFR calc Af Amer 69 (*)    All other components within normal limits  CBC  URINALYSIS, ROUTINE W REFLEX MICROSCOPIC    Imaging Review Ct Head Wo Contrast  11/11/2013   CLINICAL DATA:  Headaches for 1 day  EXAM: CT HEAD WITHOUT CONTRAST  TECHNIQUE: Contiguous axial images were obtained from the base of the skull through the vertex without intravenous contrast.  COMPARISON:  02/02/2013  FINDINGS: Bony calvarium is intact. No gross soft tissue abnormality is noted. No findings to suggest acute hemorrhage, acute infarction or space-occupying mass lesion are noted.  IMPRESSION: No acute abnormality noted.   Electronically Signed   By: Inez Catalina M.D.   On: 11/11/2013 15:11    Patient is feeling better with fluids. I explained her test results and all labs.  The patient does not have any focal neurological deficits and no fine motor deficits on exam and she is able to walk without difficulty.  I have advised her that this could be an evolving process and that she'll need to monitor her condition closely and return here for any worsening in her condition.  The family and the patient voiced an understanding of the plan and  All QUESTIONS were answered.  The patient will be given medications for her headache.  Told to return here as needed  MDM   Final diagnoses:  Headache       Brent General, PA-C 11/11/13 1535

## 2013-11-11 NOTE — ED Provider Notes (Signed)
Medical screening examination/treatment/procedure(s) were performed by non-physician practitioner and as supervising physician I was immediately available for consultation/collaboration.   EKG Interpretation None       Charlesetta Shanks, MD 11/11/13 [phone]

## 2013-11-21 ENCOUNTER — Encounter: Payer: Self-pay | Admitting: Internal Medicine

## 2013-12-05 ENCOUNTER — Ambulatory Visit (INDEPENDENT_AMBULATORY_CARE_PROVIDER_SITE_OTHER): Admitting: *Deleted

## 2013-12-05 DIAGNOSIS — R55 Syncope and collapse: Secondary | ICD-10-CM

## 2013-12-05 LAB — MDC_IDC_ENUM_SESS_TYPE_REMOTE

## 2013-12-13 NOTE — Progress Notes (Signed)
Loop recorder 

## 2013-12-20 ENCOUNTER — Other Ambulatory Visit: Payer: Self-pay | Admitting: *Deleted

## 2013-12-20 MED ORDER — NADOLOL 20 MG PO TABS: 20.0000 mg | ORAL_TABLET | Freq: Two times a day (BID) | ORAL | Status: DC

## 2013-12-20 MED ORDER — LOSARTAN POTASSIUM-HCTZ 50-12.5 MG PO TABS: 1.0000 | ORAL_TABLET | Freq: Every day | ORAL | Status: DC

## 2013-12-20 MED ORDER — LEVOTHYROXINE SODIUM 75 MCG PO TABS: 75.0000 ug | ORAL_TABLET | Freq: Every day | ORAL | Status: DC

## 2013-12-20 MED ORDER — LIOTHYRONINE SODIUM 5 MCG PO TABS: 5.0000 ug | ORAL_TABLET | Freq: Every day | ORAL | Status: DC

## 2013-12-21 ENCOUNTER — Encounter (HOSPITAL_COMMUNITY): Payer: Self-pay | Admitting: Internal Medicine

## 2013-12-31 ENCOUNTER — Other Ambulatory Visit: Payer: Self-pay | Admitting: Internal Medicine

## 2014-01-01 ENCOUNTER — Other Ambulatory Visit: Payer: Self-pay | Admitting: *Deleted

## 2014-01-01 MED ORDER — LOSARTAN POTASSIUM-HCTZ 50-12.5 MG PO TABS: 1.0000 | ORAL_TABLET | Freq: Every day | ORAL | Status: DC

## 2014-01-07 ENCOUNTER — Other Ambulatory Visit: Payer: Self-pay | Admitting: Internal Medicine

## 2014-01-15 ENCOUNTER — Encounter: Payer: Self-pay | Admitting: Internal Medicine

## 2014-02-02 ENCOUNTER — Ambulatory Visit (INDEPENDENT_AMBULATORY_CARE_PROVIDER_SITE_OTHER): Admitting: *Deleted

## 2014-02-02 DIAGNOSIS — R55 Syncope and collapse: Secondary | ICD-10-CM

## 2014-02-02 LAB — MDC_IDC_ENUM_SESS_TYPE_REMOTE

## 2014-02-08 NOTE — Progress Notes (Signed)
Loop recorder 

## 2014-02-09 ENCOUNTER — Encounter: Payer: Self-pay | Admitting: Internal Medicine

## 2014-03-05 ENCOUNTER — Ambulatory Visit (INDEPENDENT_AMBULATORY_CARE_PROVIDER_SITE_OTHER): Admitting: *Deleted

## 2014-03-05 DIAGNOSIS — R55 Syncope and collapse: Secondary | ICD-10-CM

## 2014-03-05 LAB — MDC_IDC_ENUM_SESS_TYPE_REMOTE

## 2014-03-06 ENCOUNTER — Telehealth: Payer: Self-pay

## 2014-03-06 NOTE — Telephone Encounter (Signed)
URI is most likely viral given the mild HE ENT  Symptoms  Please explain that  in viral URIS, an antibiotic will not help the symptoms and will increase the risk of developing diarrhea.,  Sudafed PE and Afrin nasal spray prn nasal decongestants, and tylenol 650 mg hrs for aches and pains,  Milta Deiters Med sinus rinse twice daily for  Sinus rinse

## 2014-03-06 NOTE — Telephone Encounter (Signed)
Head cold symptoms, with discolored mucus, patient stated she feels light headed, patient is afebrile. Symptoms for 3 days offered patient an appointment with the NP Patient stated she has no transportation to come into office.

## 2014-03-06 NOTE — Telephone Encounter (Signed)
Patient notified and voiced understanding.

## 2014-03-06 NOTE — Telephone Encounter (Signed)
The patient is hoping something can be called in for her cold symptoms.  She was offered several appointments, but stated she is unable to come in the office due to being too sick.

## 2014-03-06 NOTE — Progress Notes (Signed)
Loop recorder 

## 2014-03-30 ENCOUNTER — Encounter: Payer: Self-pay | Admitting: Internal Medicine

## 2014-04-03 ENCOUNTER — Encounter: Payer: Self-pay | Admitting: Internal Medicine

## 2014-04-05 ENCOUNTER — Ambulatory Visit (INDEPENDENT_AMBULATORY_CARE_PROVIDER_SITE_OTHER): Admitting: *Deleted

## 2014-04-05 DIAGNOSIS — R55 Syncope and collapse: Secondary | ICD-10-CM

## 2014-04-05 LAB — MDC_IDC_ENUM_SESS_TYPE_REMOTE

## 2014-04-10 NOTE — Progress Notes (Signed)
Loop recorder 

## 2014-04-11 ENCOUNTER — Other Ambulatory Visit: Payer: Self-pay | Admitting: Internal Medicine

## 2014-05-04 ENCOUNTER — Ambulatory Visit (INDEPENDENT_AMBULATORY_CARE_PROVIDER_SITE_OTHER): Admitting: *Deleted

## 2014-05-04 DIAGNOSIS — R55 Syncope and collapse: Secondary | ICD-10-CM | POA: Diagnosis not present

## 2014-05-10 NOTE — Progress Notes (Signed)
Loop recorder 

## 2014-05-15 ENCOUNTER — Encounter: Payer: Self-pay | Admitting: Internal Medicine

## 2014-06-04 ENCOUNTER — Ambulatory Visit (INDEPENDENT_AMBULATORY_CARE_PROVIDER_SITE_OTHER): Admitting: *Deleted

## 2014-06-04 DIAGNOSIS — R55 Syncope and collapse: Secondary | ICD-10-CM

## 2014-06-05 LAB — CUP PACEART REMOTE DEVICE CHECK: Date Time Interrogation Session: 20160524152610

## 2014-06-05 NOTE — Progress Notes (Signed)
Loop recorder 

## 2014-06-15 LAB — CUP PACEART REMOTE DEVICE CHECK: MDC IDC SESS DTM: 20160603123715

## 2014-07-04 ENCOUNTER — Ambulatory Visit (INDEPENDENT_AMBULATORY_CARE_PROVIDER_SITE_OTHER): Admitting: *Deleted

## 2014-07-04 ENCOUNTER — Encounter: Payer: Self-pay | Admitting: Internal Medicine

## 2014-07-04 DIAGNOSIS — R55 Syncope and collapse: Secondary | ICD-10-CM | POA: Diagnosis not present

## 2014-07-04 NOTE — Progress Notes (Signed)
Loop recorder 

## 2014-07-10 LAB — CUP PACEART REMOTE DEVICE CHECK: Date Time Interrogation Session: 20160628093824

## 2014-07-26 ENCOUNTER — Encounter: Payer: Self-pay | Admitting: Internal Medicine

## 2014-08-03 ENCOUNTER — Ambulatory Visit (INDEPENDENT_AMBULATORY_CARE_PROVIDER_SITE_OTHER)

## 2014-08-03 DIAGNOSIS — R55 Syncope and collapse: Secondary | ICD-10-CM

## 2014-08-07 ENCOUNTER — Encounter: Payer: Self-pay | Admitting: Internal Medicine

## 2014-08-15 NOTE — Progress Notes (Signed)
Loop recorder 

## 2014-08-24 LAB — CUP PACEART REMOTE DEVICE CHECK: Date Time Interrogation Session: 20160812155848

## 2014-08-28 ENCOUNTER — Other Ambulatory Visit: Payer: Self-pay | Admitting: Internal Medicine

## 2014-08-28 ENCOUNTER — Other Ambulatory Visit: Payer: Self-pay | Admitting: *Deleted

## 2014-08-28 DIAGNOSIS — I10 Essential (primary) hypertension: Secondary | ICD-10-CM

## 2014-08-28 DIAGNOSIS — E034 Atrophy of thyroid (acquired): Secondary | ICD-10-CM

## 2014-08-28 DIAGNOSIS — E785 Hyperlipidemia, unspecified: Secondary | ICD-10-CM

## 2014-08-28 NOTE — Telephone Encounter (Signed)
Last OV 3.17.15.  Please advise refill

## 2014-08-29 NOTE — Telephone Encounter (Signed)
Left detailed message on pts VM she needs to be seen

## 2014-08-29 NOTE — Telephone Encounter (Signed)
Refill S DENIED HAS BEEN OVER A YEAR SINCE SEEN AND TSH CHECKED.   OFFICE VISIT NEEDED FASTING LABS  ORDERED

## 2014-09-03 ENCOUNTER — Ambulatory Visit (INDEPENDENT_AMBULATORY_CARE_PROVIDER_SITE_OTHER): Payer: TRICARE For Life (TFL) | Admitting: *Deleted

## 2014-09-03 DIAGNOSIS — R55 Syncope and collapse: Secondary | ICD-10-CM | POA: Diagnosis not present

## 2014-09-05 NOTE — Progress Notes (Signed)
Loop recorder 

## 2014-09-10 LAB — CUP PACEART REMOTE DEVICE CHECK: Date Time Interrogation Session: 20160829085720

## 2014-09-13 ENCOUNTER — Telehealth: Payer: Self-pay | Admitting: Internal Medicine

## 2014-09-13 ENCOUNTER — Other Ambulatory Visit (HOSPITAL_COMMUNITY)
Admission: RE | Admit: 2014-09-13 | Discharge: 2014-09-13 | Disposition: A | Discharge status: Home / Self Care | Source: Ambulatory Visit | Attending: Internal Medicine | Admitting: Internal Medicine

## 2014-09-13 ENCOUNTER — Ambulatory Visit (INDEPENDENT_AMBULATORY_CARE_PROVIDER_SITE_OTHER): Admitting: Internal Medicine

## 2014-09-13 ENCOUNTER — Encounter: Payer: Self-pay | Admitting: Internal Medicine

## 2014-09-13 VITALS — BP 104/70 | HR 49 | Temp 98.7°F | Resp 14 | Ht 61.0 in | Wt 151.8 lb

## 2014-09-13 DIAGNOSIS — E038 Other specified hypothyroidism: Secondary | ICD-10-CM | POA: Diagnosis not present

## 2014-09-13 DIAGNOSIS — I10 Essential (primary) hypertension: Secondary | ICD-10-CM

## 2014-09-13 DIAGNOSIS — Z113 Encounter for screening for infections with a predominantly sexual mode of transmission: Secondary | ICD-10-CM

## 2014-09-13 DIAGNOSIS — Z23 Encounter for immunization: Secondary | ICD-10-CM | POA: Diagnosis not present

## 2014-09-13 DIAGNOSIS — Z124 Encounter for screening for malignant neoplasm of cervix: Secondary | ICD-10-CM

## 2014-09-13 DIAGNOSIS — Z01411 Encounter for gynecological examination (general) (routine) with abnormal findings: Secondary | ICD-10-CM | POA: Insufficient documentation

## 2014-09-13 DIAGNOSIS — Z1151 Encounter for screening for human papillomavirus (HPV): Secondary | ICD-10-CM | POA: Diagnosis not present

## 2014-09-13 DIAGNOSIS — E785 Hyperlipidemia, unspecified: Secondary | ICD-10-CM

## 2014-09-13 DIAGNOSIS — E034 Atrophy of thyroid (acquired): Secondary | ICD-10-CM

## 2014-09-13 DIAGNOSIS — Z0000 Encounter for general adult medical examination without abnormal findings: Secondary | ICD-10-CM | POA: Diagnosis not present

## 2014-09-13 DIAGNOSIS — Z1239 Encounter for other screening for malignant neoplasm of breast: Secondary | ICD-10-CM

## 2014-09-13 LAB — COMPREHENSIVE METABOLIC PANEL
ALT: 11 U/L (ref 0–35)
AST: 19 U/L (ref 0–37)
Albumin: 4.1 g/dL (ref 3.5–5.2)
Alkaline Phosphatase: 80 U/L (ref 39–117)
BUN: 19 mg/dL (ref 6–23)
CO2: 27 meq/L (ref 19–32)
Calcium: 9.8 mg/dL (ref 8.4–10.5)
Chloride: 104 mEq/L (ref 96–112)
Creatinine, Ser: 1.12 mg/dL (ref 0.40–1.20)
GFR: 62.81 mL/min (ref >60.00)
GLUCOSE: 98 mg/dL (ref 70–99)
POTASSIUM: 3.9 meq/L (ref 3.5–5.1)
SODIUM: 140 meq/L (ref 135–145)
Total Bilirubin: 0.7 mg/dL (ref 0.2–1.2)
Total Protein: 7.4 g/dL (ref 6.0–8.3)

## 2014-09-13 LAB — LIPID PANEL
CHOL/HDL RATIO: 3
Cholesterol: 221 mg/dL — ABNORMAL HIGH (ref 0–200)
HDL: 70.4 mg/dL (ref >39.00)
LDL Cholesterol: 137 mg/dL — ABNORMAL HIGH (ref 0–99)
NONHDL: 150.79
Triglycerides: 67 mg/dL (ref 0.0–149.0)
VLDL: 13.4 mg/dL (ref 0.0–40.0)

## 2014-09-13 NOTE — Progress Notes (Signed)
Patient ID: Christine Hull, female    DOB: [birth year]  Age: 64 y.o. MRN: [id number]  The patient is here for annual  wellness examination and management of other chronic and acute problems.  Last seen in March 2015.  Has not had annual exam in over 3 years.   Last colonoscopy ? [facility] 2011  No records avaiable  Overdue for mammogram 2013 last one FH mternal aunt  Overweight ::  Body mass index is 28.69 kg/(m^2).   The risk factors are reflected in the social history.  The roster of all physicians providing medical care to patient - is listed in the Snapshot section of the chart.  Activities of daily living:  The patient is 100% independent in all ADLs: dressing, toileting, feeding as well as independent mobility  Home safety : The patient has smoke detectors in the home. They wear seatbelts.  There are no firearms at home. There is no violence in the home.   There is no risks for hepatitis, STDs or HIV. There is no   history of blood transfusion. They have no travel history to infectious disease endemic areas of the world.  The patient has seen their dentist in the last six month. They have seen their eye doctor in the last year. They admit to slight hearing difficulty with regard to whispered voices and some television programs.  They have deferred audiologic testing in the last year.  They do not  have excessive sun exposure. Discussed the need for sun protection: hats, long sleeves and use of sunscreen if there is significant sun exposure.   Diet: the importance of a healthy diet is discussed. They do have a healthy diet.  The benefits of regular aerobic exercise were discussed. She walks 4 times per week ,  20 minutes.   Depression screen: there are no signs or vegative symptoms of depression- irritability, change in appetite, anhedonia, sadness/tearfullness.  Cognitive assessment: the patient manages all their financial and personal affairs and is actively engaged. They  could relate day,date,year and events; recalled 2/3 objects at 3 minutes; performed clock-face test normally.  The following portions of the patient's history were reviewed and updated as appropriate: allergies, current medications, past family history, past medical history,  past surgical history, past social history  and problem list.  Visual acuity was not assessed per patient preference since she has regular follow up with her ophthalmologist. Hearing and body mass index were assessed and reviewed.   During the course of the visit the patient was educated and counseled about appropriate screening and preventive services including : fall prevention , diabetes screening, nutrition counseling, colorectal cancer screening, and recommended immunizations.    CC: The primary encounter diagnosis was Encounter for immunization. Diagnoses of Breast cancer screening, Screening for cervical cancer, Screen for STD (sexually transmitted disease), Hypothyroidism due to acquired atrophy of thyroid, Hyperlipidemia, Essential hypertension, and Encounter for preventive health examination were also pertinent to this visit. Has had syncope worked up by Cardiology with loop recorder still in place on left chest wall from 2015,  sinus bradycardia and QT prolongation found.  Prescribed Corgard by Dr Caryl Comes which was reduced to qhs, it was increased again to twice daily  Again because patient states that her hear felt better on twice daily dosing .       Tobacco abuse:  Has reduced use to 8 cigs daily ,  Down from 1/2 pack , smokes to relieve tensionm  Not during day with the grandkids,  smokes at night, \  Uses treadmill daily for 30 minutes   Hypertension:     History Kolleen has a past medical history of Heart murmur; Hypertension; Hypothyroidism; Prolonged QT interval (02/02/2013); Near syncope (02/02/2013); [employer] LINQ (05/09/2013); and Sinus bradycardia (05/09/2013).   She has past surgical history that  includes Breast biopsy (Right, 2011); Augmentation mammaplasty (Bilateral, 1990's); and loop recorder implant (N/A, 02/03/2013).   Her family history includes Cancer in her maternal aunt; Early death in her father and mother; Stroke in her mother.She reports that she has been smoking Cigarettes.  She has a 20.5 pack-year smoking history. She has never used smokeless tobacco. She reports that she drinks about 3.0 oz of alcohol per week. She reports that she does not use illicit drugs.  Outpatient Prescriptions Prior to Visit  Medication Sig Dispense Refill  . fish oil-omega-3 fatty acids 1000 MG capsule Take 1 g by mouth daily.     Marland Kitchen liothyronine (CYTOMEL) 5 MCG tablet Take 1 tablet (5 mcg total) by mouth daily. 90 tablet 2  . losartan-hydrochlorothiazide (HYZAAR) 50-12.5 MG per tablet Take 1 tablet by mouth daily. 30 tablet 0  . nadolol (CORGARD) 20 MG tablet TAKE 1 TABLET BY MOUTH TWICE A DAY 60 tablet 0  . levothyroxine (SYNTHROID, LEVOTHROID) 75 MCG tablet Take 1 tablet (75 mcg total) by mouth daily before breakfast. 90 tablet 3  . levothyroxine (SYNTHROID, LEVOTHROID) 75 MCG tablet TAKE 1 TABLET (75 MCG TOTAL) BY MOUTH DAILY BEFORE BREAKFAST. 30 tablet 0  . Vitamin D, Ergocalciferol, (DRISDOL) 50000 UNITS CAPS capsule Take 50,000 Units by mouth every 30 (thirty) days.     No facility-administered medications prior to visit.    Review of Systems   Patient denies headache, fevers, malaise, unintentional weight loss, skin rash, eye pain, sinus congestion and sinus pain, sore throat, dysphagia,  hemoptysis , cough, dyspnea, wheezing, chest pain, palpitations, orthopnea, edema, abdominal pain, nausea, melena, diarrhea, constipation, flank pain, dysuria, hematuria, urinary  Frequency, nocturia, numbness, tingling, seizures,  Focal weakness, Loss of consciousness,  Tremor, insomnia, depression, anxiety, and suicidal ideation.      Objective:  BP 104/70 mmHg  Pulse 49  Temp(Src) 98.7 F (37.1  C) (Oral)  Resp 14  Ht 5\' 1"  (1.549 m)  Wt 151 lb 12 oz (68.833 kg)  BMI 28.69 kg/m2  SpO2 98%  Physical Exam  General Appearance:    Alert, cooperative, no distress, appears stated age  Head:    Normocephalic, without obvious abnormality, atraumatic  Eyes:    PERRL, conjunctiva/corneas clear, EOM's intact, fundi    benign, both eyes  Ears:    Normal TM's and external ear canals, both ears  Nose:   Nares normal, septum midline, mucosa normal, no drainage    or sinus tenderness  Throat:   Lips, mucosa, and tongue normal; teeth and gums normal  Neck:   Supple, symmetrical, trachea midline, no adenopathy;    thyroid:  no enlargement/tenderness/nodules; no carotid   bruit or JVD  Back:     Symmetric, no curvature, ROM normal, no CVA tenderness  Lungs:     Clear to auscultation bilaterally, respirations unlabored  Chest Wall:    No tenderness or deformity   Heart:    Regular rate and rhythm, S1 and S2 normal, no murmur, rub   or gallop  Breast Exam:    No tenderness, masses, or nipple abnormality  Abdomen:     Soft, non-tender, bowel sounds active all four quadrants,  no masses, no organomegaly  Genitalia:    Pelvic: cervix normal in appearance, external genitalia normal, no adnexal masses or tenderness, no cervical motion tenderness, rectovaginal septum normal, uterus normal size, shape, and consistency and vagina normal without discharge  Extremities:   Extremities normal, atraumatic, no cyanosis or edema  Pulses:   2+ and symmetric all extremities  Skin:   Skin color, texture, turgor normal, no rashes or lesions  Lymph nodes:   Cervical, supraclavicular, and axillary nodes normal  Neurologic:   CNII-XII intact, normal strength, sensation and reflexes    throughout      Assessment & Plan:   Problem List Items Addressed This Visit      Unprioritized   Hypothyroidism    Thyroid function is suppressed on current dose.  Dose reduced to .   Lab Results  Component Value Date    TSH 0.404* 09/13/2014         Relevant Medications   levothyroxine (SYNTHROID, LEVOTHROID) 50 MCG tablet   Encounter for preventive health examination    Annual comprehensive preventive exam was done as well as an evaluation and management of chronic conditions .  During the course of the visit the patient was educated and counseled about appropriate screening and preventive services including :  diabetes screening, lipid analysis with projected  10 year  risk for CAD  Which is 5.75 % using the Framingham risk calculator for women, , nutrition counseling, colorectal cancer screening, and recommended immunizations.  Printed recommendations for health maintenance screenings was given.        Other Visit Diagnoses    Encounter for immunization    -  Primary    Breast cancer screening        Relevant Orders    MM DIGITAL SCREENING BILATERAL    Screening for cervical cancer        Relevant Orders    Cytology - PAP    Screen for STD (sexually transmitted disease)        Relevant Orders    HIV antibody (Completed)    Hepatitis C antibody (Completed)    Hyperlipidemia        Essential hypertension           I have discontinued Ms. Fullam's Vitamin D (Ergocalciferol) and levothyroxine. I have also changed her levothyroxine. Additionally, I am having her maintain her fish oil-omega-3 fatty acids, liothyronine, nadolol, and losartan-hydrochlorothiazide.  Meds ordered this encounter  Medications  . levothyroxine (SYNTHROID, LEVOTHROID) 50 MCG tablet    Sig: Take 1 tablet (50 mcg total) by mouth daily before breakfast.    Dispense:  90 tablet    Refill:  1    Medications Discontinued During This Encounter  Medication Reason  . Vitamin D, Ergocalciferol, (DRISDOL) 50000 UNITS CAPS capsule Patient Preference  . levothyroxine (SYNTHROID, LEVOTHROID) 75 MCG tablet   . levothyroxine (SYNTHROID, LEVOTHROID) 75 MCG tablet Reorder    Follow-up: No Follow-up on file.   Crecencio Mc,  MD

## 2014-09-13 NOTE — Patient Instructions (Signed)

## 2014-09-13 NOTE — Progress Notes (Signed)
Pre-visit discussion using our clinic review tool. No additional management support is needed unless otherwise documented below in the visit note.  

## 2014-09-13 NOTE — Telephone Encounter (Signed)
Left msg to call office for appt details. mammo on 09/20/14 at 1:20 pm [facility]

## 2014-09-14 LAB — T4 AND TSH
T4, Total: 9.6 ug/dL (ref 4.5–12.0)
TSH: 0.404 u[IU]/mL — ABNORMAL LOW (ref 0.450–4.500)

## 2014-09-14 LAB — HEPATITIS C ANTIBODY: HCV AB: NEGATIVE

## 2014-09-14 LAB — HIV ANTIBODY (ROUTINE TESTING W REFLEX): HIV: NONREACTIVE

## 2014-09-15 MED ORDER — LEVOTHYROXINE SODIUM 50 MCG PO TABS: 50.0000 ug | ORAL_TABLET | Freq: Every day | ORAL | Status: DC

## 2014-09-15 NOTE — Assessment & Plan Note (Addendum)
Annual comprehensive preventive exam was done as well as an evaluation and management of chronic conditions .  During the course of the visit the patient was educated and counseled about appropriate screening and preventive services including :  diabetes screening, lipid analysis with projected  10 year  risk for CAD  Which is 5.75 % using the Framingham risk calculator for women, , nutrition counseling, colorectal cancer screening, and recommended immunizations.  Printed recommendations for health maintenance screenings was given.

## 2014-09-15 NOTE — Assessment & Plan Note (Signed)
Thyroid function is suppressed on current dose.  Dose reduced to .   Lab Results  Component Value Date   TSH 0.404* 09/13/2014

## 2014-09-19 LAB — CYTOLOGY - PAP

## 2014-09-20 ENCOUNTER — Encounter: Payer: Self-pay | Admitting: *Deleted

## 2014-09-20 ENCOUNTER — Ambulatory Visit
Admission: RE | Admit: 2014-09-20 | Discharge: 2014-09-20 | Disposition: A | Discharge status: Home / Self Care | Source: Ambulatory Visit | Attending: Internal Medicine | Admitting: Internal Medicine

## 2014-09-20 DIAGNOSIS — Z1239 Encounter for other screening for malignant neoplasm of breast: Secondary | ICD-10-CM

## 2014-10-02 ENCOUNTER — Ambulatory Visit (INDEPENDENT_AMBULATORY_CARE_PROVIDER_SITE_OTHER): Admitting: *Deleted

## 2014-10-02 DIAGNOSIS — R55 Syncope and collapse: Secondary | ICD-10-CM

## 2014-10-02 NOTE — Progress Notes (Signed)
Loop recorder 

## 2014-10-03 ENCOUNTER — Encounter: Payer: Self-pay | Admitting: Internal Medicine

## 2014-10-06 LAB — CUP PACEART REMOTE DEVICE CHECK: MDC IDC SESS DTM: 20160920043700

## 2014-10-06 NOTE — Progress Notes (Signed)
Carelink summary report received. Battery status OK. Normal device function. No new symptom episodes, tachy episodes, brady, or pause episodes. No new AF episodes. Monthly summary reports and ROV with SK in 01/2015.

## 2014-10-23 ENCOUNTER — Encounter: Payer: Self-pay | Admitting: Internal Medicine

## 2014-10-31 ENCOUNTER — Ambulatory Visit (INDEPENDENT_AMBULATORY_CARE_PROVIDER_SITE_OTHER): Admitting: *Deleted

## 2014-10-31 DIAGNOSIS — R55 Syncope and collapse: Secondary | ICD-10-CM | POA: Diagnosis not present

## 2014-11-01 NOTE — Progress Notes (Signed)
LOOP RECORDER  

## 2014-11-06 ENCOUNTER — Other Ambulatory Visit: Payer: Self-pay | Admitting: Internal Medicine

## 2014-11-20 LAB — CUP PACEART REMOTE DEVICE CHECK: MDC IDC SESS DTM: 20161020043604

## 2014-11-20 NOTE — Progress Notes (Signed)
Carelink summary report received. Battery status OK. Normal device function. No new symptom episodes, tachy episodes, brady, or pause episodes. No new AF episodes. Monthly summary reports and ROV with SK in 01/2015.

## 2014-11-21 MED ORDER — LEVOTHYROXINE SODIUM 50 MCG PO TABS: 50.0000 ug | ORAL_TABLET | Freq: Every day | ORAL | Status: DC

## 2014-11-21 NOTE — Addendum Note (Signed)
Addended by: Peggyann Shoals on: 11/21/2014 10:24 AM   Modules accepted: Orders, Medications

## 2014-11-30 ENCOUNTER — Ambulatory Visit (INDEPENDENT_AMBULATORY_CARE_PROVIDER_SITE_OTHER): Admitting: *Deleted

## 2014-11-30 DIAGNOSIS — R55 Syncope and collapse: Secondary | ICD-10-CM | POA: Diagnosis not present

## 2014-11-30 LAB — CUP PACEART REMOTE DEVICE CHECK: MDC IDC SESS DTM: 20161119050620

## 2014-12-03 NOTE — Progress Notes (Signed)
Carelink Summary Report / Loop Recorder 

## 2014-12-03 NOTE — Progress Notes (Signed)
LOOP RECORDER  

## 2014-12-05 ENCOUNTER — Ambulatory Visit: Admitting: Internal Medicine

## 2014-12-24 ENCOUNTER — Other Ambulatory Visit: Payer: Self-pay

## 2014-12-24 ENCOUNTER — Telehealth: Payer: Self-pay | Admitting: *Deleted

## 2014-12-24 MED ORDER — LIOTHYRONINE SODIUM 5 MCG PO TABS: 5.0000 ug | ORAL_TABLET | Freq: Every day | ORAL | Status: DC

## 2014-12-24 NOTE — Telephone Encounter (Signed)
Patient requested a medication refill for liothyronine. Please send Rx over to [facility].

## 2014-12-24 NOTE — Telephone Encounter (Signed)
Completed.

## 2014-12-31 ENCOUNTER — Ambulatory Visit (INDEPENDENT_AMBULATORY_CARE_PROVIDER_SITE_OTHER): Admitting: *Deleted

## 2014-12-31 DIAGNOSIS — R55 Syncope and collapse: Secondary | ICD-10-CM | POA: Diagnosis not present

## 2015-01-01 NOTE — Progress Notes (Signed)
Carelink Summary Report / Loop Recorder 

## 2015-01-30 ENCOUNTER — Ambulatory Visit (INDEPENDENT_AMBULATORY_CARE_PROVIDER_SITE_OTHER): Admitting: *Deleted

## 2015-01-30 DIAGNOSIS — R55 Syncope and collapse: Secondary | ICD-10-CM

## 2015-01-30 NOTE — Progress Notes (Signed)
Carelink Summary Report / Loop Recorder 

## 2015-02-13 ENCOUNTER — Encounter: Payer: Self-pay | Admitting: *Deleted

## 2015-02-15 LAB — CUP PACEART REMOTE DEVICE CHECK: Date Time Interrogation Session: 20161219053613

## 2015-03-01 ENCOUNTER — Ambulatory Visit (INDEPENDENT_AMBULATORY_CARE_PROVIDER_SITE_OTHER): Admitting: *Deleted

## 2015-03-01 DIAGNOSIS — R55 Syncope and collapse: Secondary | ICD-10-CM | POA: Diagnosis not present

## 2015-03-01 NOTE — Progress Notes (Signed)
Carelink Summary Report / Loop Recorder 

## 2015-03-13 ENCOUNTER — Encounter: Payer: Self-pay | Admitting: *Deleted

## 2015-03-18 LAB — CUP PACEART REMOTE DEVICE CHECK: Date Time Interrogation Session: 20170118060653

## 2015-03-18 NOTE — Progress Notes (Signed)
Carelink summary report received. Battery status OK. Normal device function. No new symptom episodes, tachy episodes, brady, or pause episodes. No new AF episodes. Monthly summary reports and ROV/PRN 

## 2015-03-20 LAB — CUP PACEART REMOTE DEVICE CHECK: Date Time Interrogation Session: 20170217060610

## 2015-03-20 NOTE — Progress Notes (Signed)
Carelink summary report received. Battery status OK. Normal device function. No new symptom episodes, tachy episodes, brady, or pause episodes. No new AF episodes. Monthly summary reports and ROV/PRN 

## 2015-03-22 IMAGING — CT CT HEAD W/O CM
2 series · 16 of 30 positions shown, 20 images · non-contrast
Comparison: 02/02/2013

CLINICAL DATA: Headaches for 1 day

EXAM:
CT HEAD WITHOUT CONTRAST
TECHNIQUE: Contiguous axial images were obtained from the base of the skull
through the vertex without intravenous contrast.

[Series 201: head w/o, idose (1) · axial · non-contrast · 0.46mm/px · z∈[+129,+249]mm · 13 of 28 slices shown, 17 images]
[im 2/28  brain]
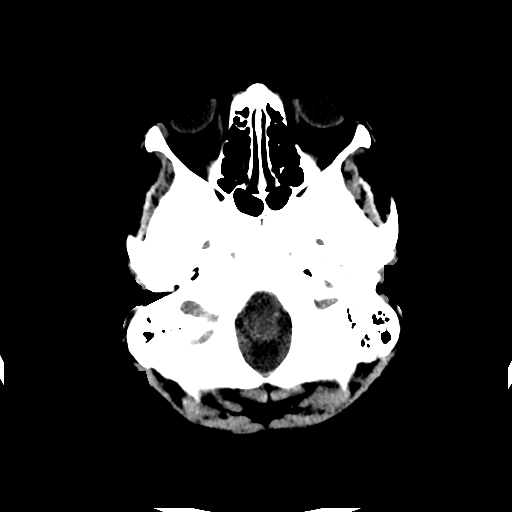
[im 2/28  bone]
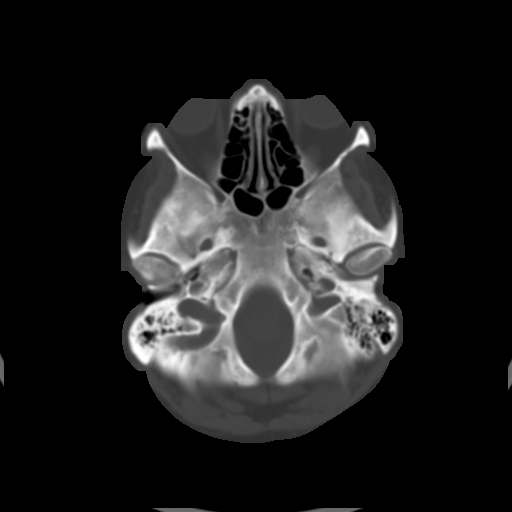
[im 4/28  brain]
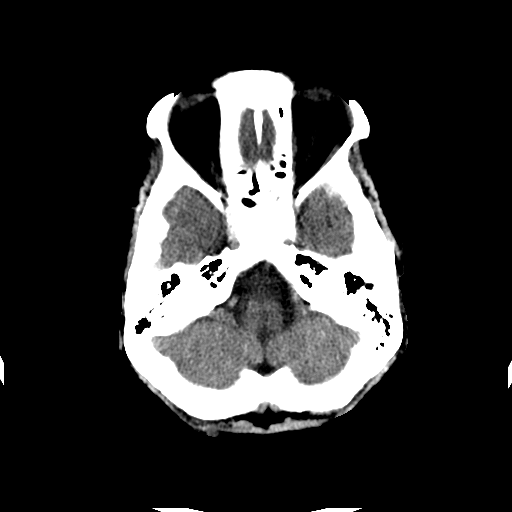
[im 6/28  brain]
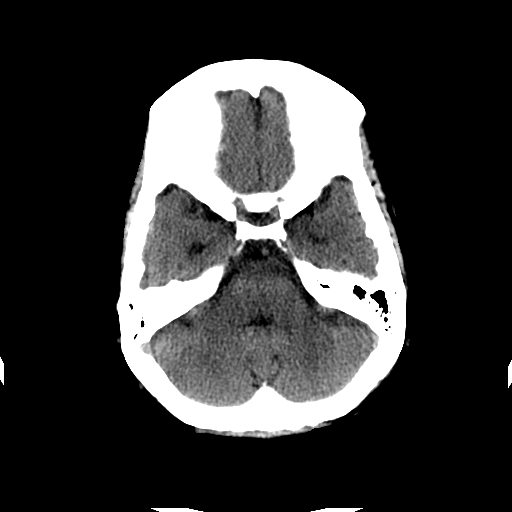
[im 8/28  brain]
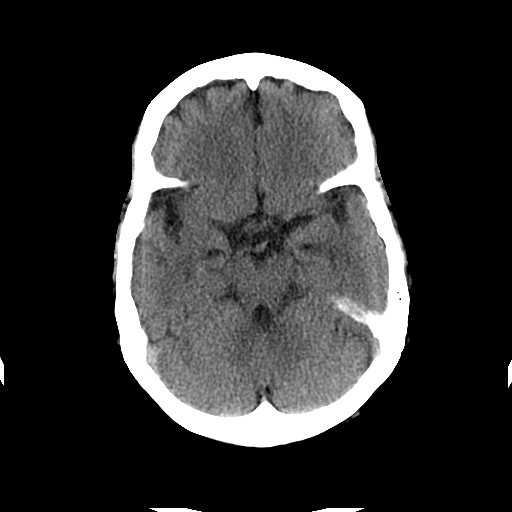
[im 10/28  brain]
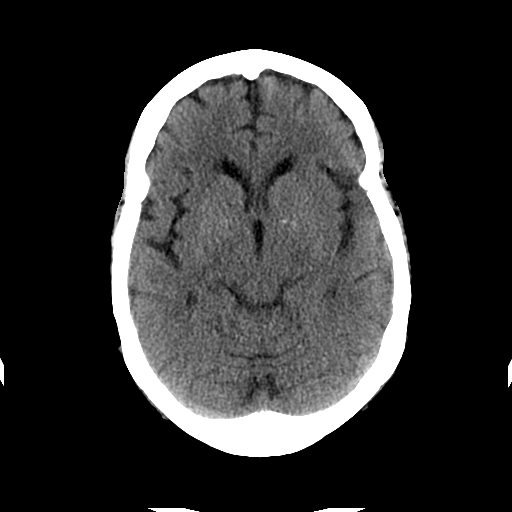
[im 10/28  bone]
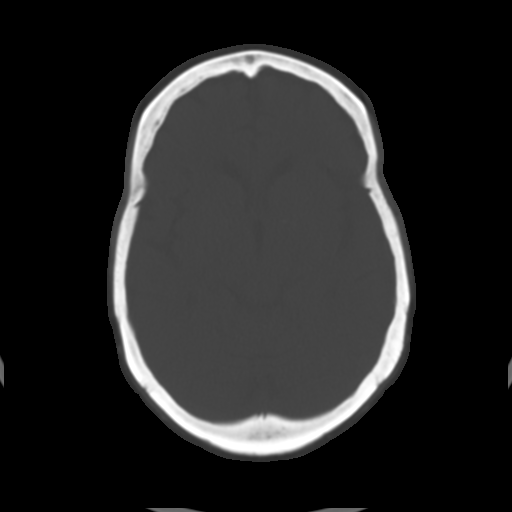
[im 12/28  brain]
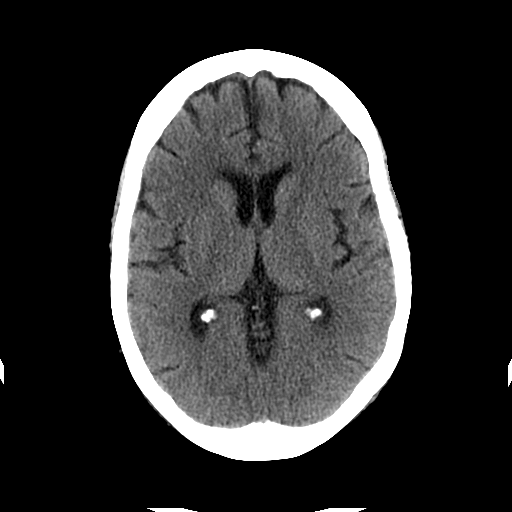
[im 14/28  brain]
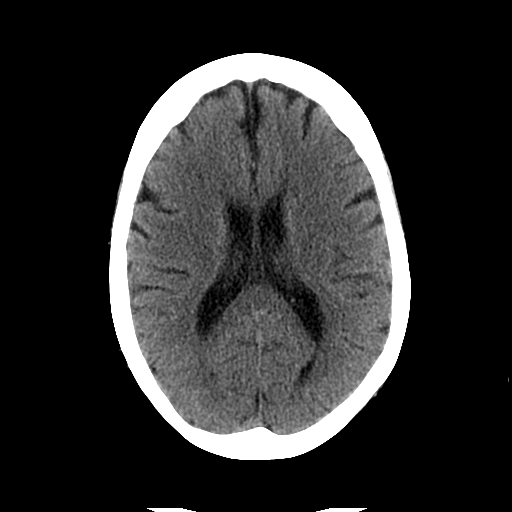
[im 16/28  brain]
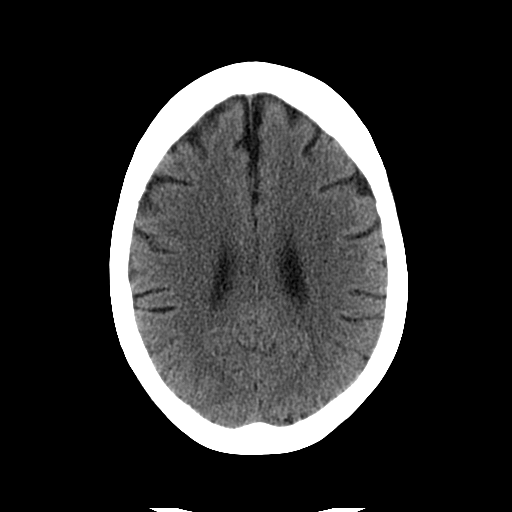
[im 18/28  brain]
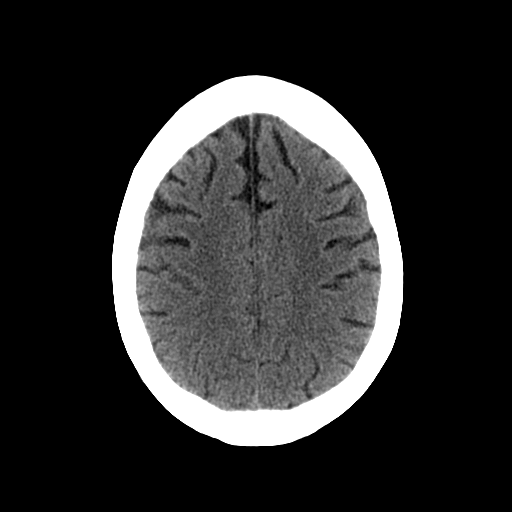
[im 18/28  bone]
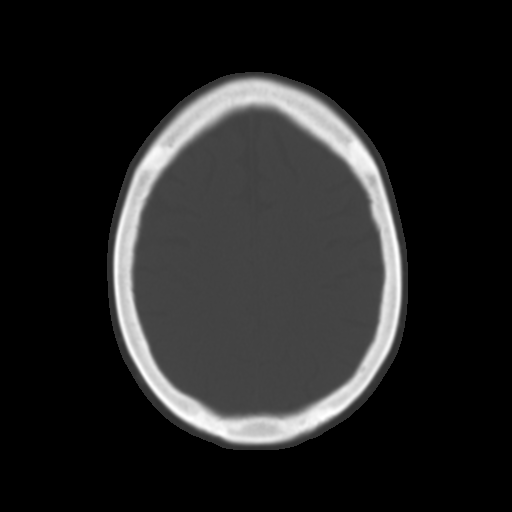
[im 20/28  brain]
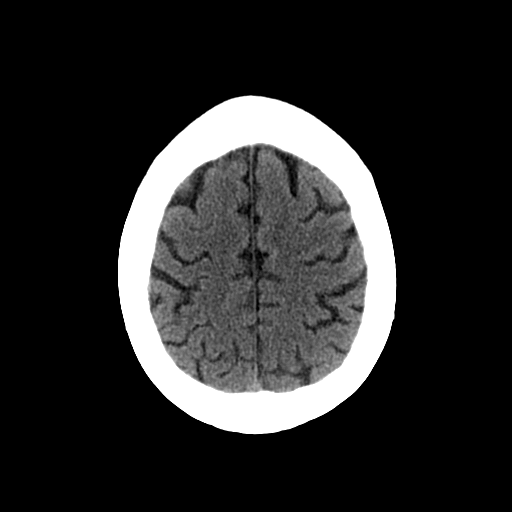
[im 22/28  brain]
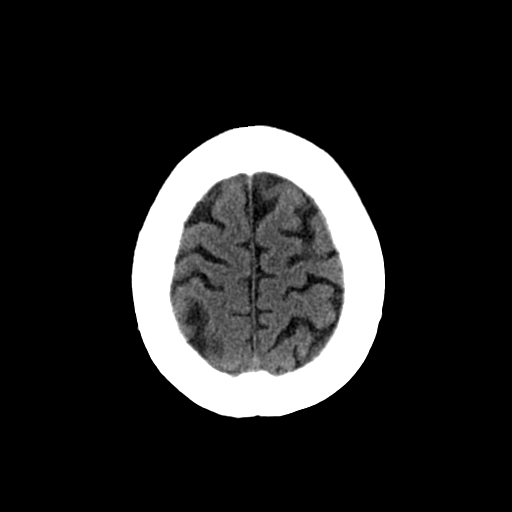
[im 24/28  brain]
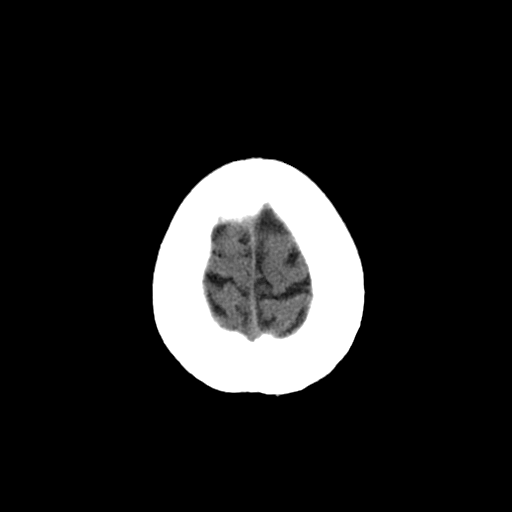
[im 26/28  brain]
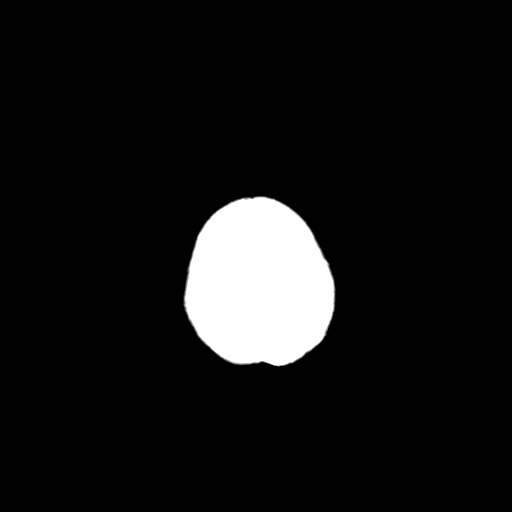
[im 26/28  bone]
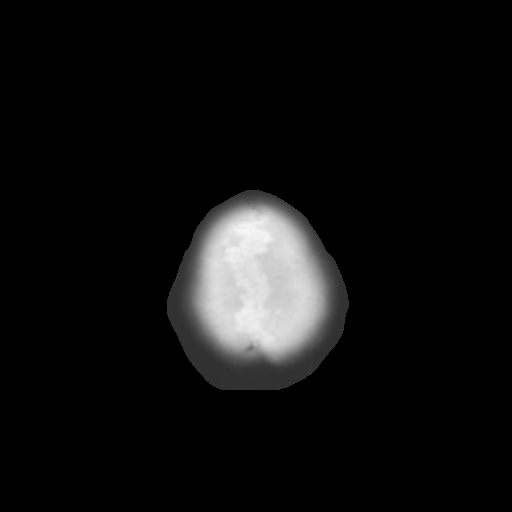

[Series 202: head w/o bone, idose (1) · axial · non-contrast · 0.46mm/px · z∈[+129,+169]mm · 3 of 28 slices shown]
[im 2/28  bone]
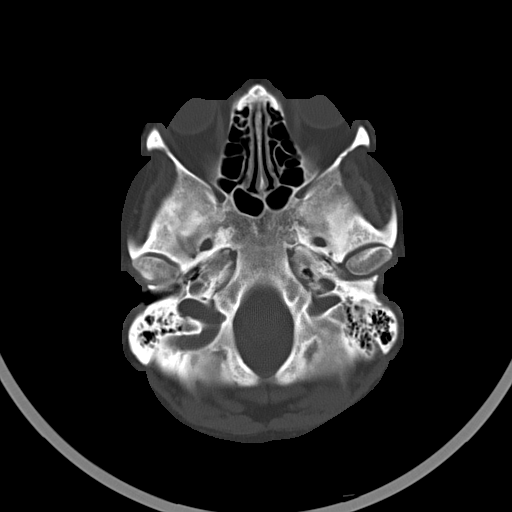
[im 6/28  bone]
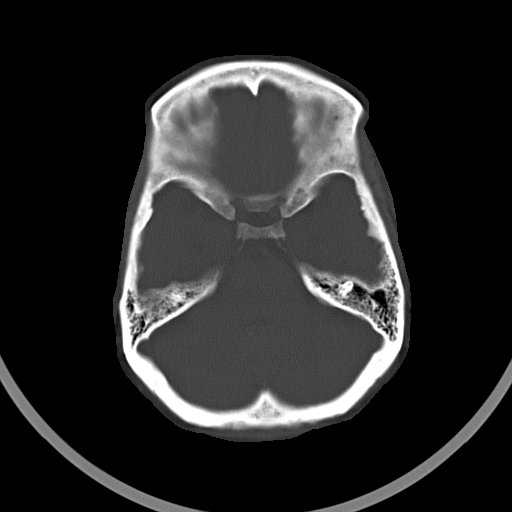
[im 10/28  bone]
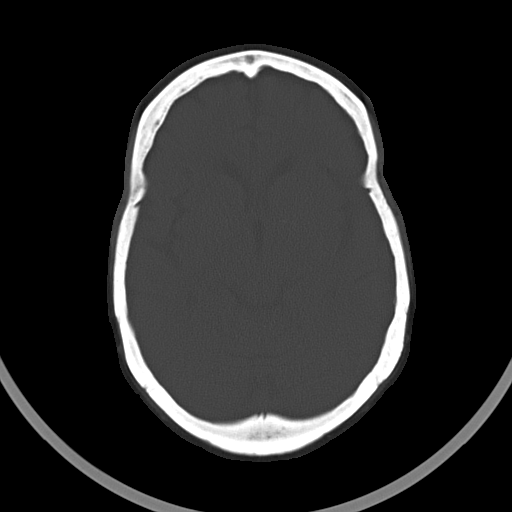

[16 of 30 positions shown; findings below may reference images not displayed]

FINDINGS: Bony calvarium is intact. No gross soft tissue abnormality is noted.
No findings to suggest acute hemorrhage, acute infarction or
space-occupying mass lesion are noted.
IMPRESSION: No acute abnormality noted.

## 2015-04-01 ENCOUNTER — Ambulatory Visit (INDEPENDENT_AMBULATORY_CARE_PROVIDER_SITE_OTHER): Admitting: *Deleted

## 2015-04-01 ENCOUNTER — Encounter

## 2015-04-01 DIAGNOSIS — R55 Syncope and collapse: Secondary | ICD-10-CM | POA: Diagnosis not present

## 2015-04-05 NOTE — Progress Notes (Signed)
Carelink Summary Report / Loop Recorder 

## 2015-04-08 ENCOUNTER — Encounter

## 2015-04-30 ENCOUNTER — Ambulatory Visit (INDEPENDENT_AMBULATORY_CARE_PROVIDER_SITE_OTHER): Admitting: *Deleted

## 2015-04-30 DIAGNOSIS — R55 Syncope and collapse: Secondary | ICD-10-CM

## 2015-04-30 NOTE — Progress Notes (Signed)
Carelink Summary Report / Loop Recorder 

## 2015-05-03 ENCOUNTER — Other Ambulatory Visit: Payer: Self-pay | Admitting: Internal Medicine

## 2015-05-30 ENCOUNTER — Ambulatory Visit (INDEPENDENT_AMBULATORY_CARE_PROVIDER_SITE_OTHER): Payer: Medicare Other | Admitting: *Deleted

## 2015-05-30 DIAGNOSIS — R55 Syncope and collapse: Secondary | ICD-10-CM

## 2015-05-31 ENCOUNTER — Encounter: Payer: Self-pay | Admitting: Internal Medicine

## 2015-05-31 DIAGNOSIS — F1010 Alcohol abuse, uncomplicated: Secondary | ICD-10-CM | POA: Insufficient documentation

## 2015-05-31 NOTE — Progress Notes (Signed)
Carelink Summary Report / Loop Recorder 

## 2015-06-07 LAB — CUP PACEART REMOTE DEVICE CHECK: MDC IDC SESS DTM: 20170319063702

## 2015-06-10 LAB — CUP PACEART REMOTE DEVICE CHECK: MDC IDC SESS DTM: 20170418070642

## 2015-06-10 NOTE — Progress Notes (Signed)
Carelink summary report received. Battery status OK. Normal device function. No new symptom episodes, tachy episodes, brady, or pause episodes. No new AF episodes. Monthly summary reports and ROV/PRN 

## 2015-07-01 ENCOUNTER — Ambulatory Visit (INDEPENDENT_AMBULATORY_CARE_PROVIDER_SITE_OTHER): Payer: Medicare Other | Admitting: *Deleted

## 2015-07-01 DIAGNOSIS — R55 Syncope and collapse: Secondary | ICD-10-CM

## 2015-07-01 NOTE — Progress Notes (Signed)
Carelink Summary Report / Loop Recorder 

## 2015-07-08 LAB — CUP PACEART REMOTE DEVICE CHECK
Date Time Interrogation Session: 20170518070621
Date Time Interrogation Session: 20170617070903

## 2015-07-29 ENCOUNTER — Telehealth: Payer: Self-pay | Admitting: *Deleted

## 2015-07-29 ENCOUNTER — Ambulatory Visit (INDEPENDENT_AMBULATORY_CARE_PROVIDER_SITE_OTHER): Payer: Medicare Other | Admitting: *Deleted

## 2015-07-29 DIAGNOSIS — R55 Syncope and collapse: Secondary | ICD-10-CM | POA: Diagnosis not present

## 2015-07-29 DIAGNOSIS — I10 Essential (primary) hypertension: Secondary | ICD-10-CM

## 2015-07-29 DIAGNOSIS — E034 Atrophy of thyroid (acquired): Secondary | ICD-10-CM

## 2015-07-29 DIAGNOSIS — E559 Vitamin D deficiency, unspecified: Secondary | ICD-10-CM

## 2015-07-29 DIAGNOSIS — E785 Hyperlipidemia, unspecified: Secondary | ICD-10-CM

## 2015-07-29 NOTE — Telephone Encounter (Addendum)
Patient has requested to know if labs are needed before apt on 08/07/15 Pt contact [facility]

## 2015-07-29 NOTE — Telephone Encounter (Signed)
Last labs were 9/16.  Please advise and order if needed. thanks

## 2015-07-29 NOTE — Progress Notes (Signed)
Carelink Summary Report / Loop Recorder 

## 2015-07-30 NOTE — Telephone Encounter (Signed)
Fasting labs ordered;  please make a lab appt for patient if not already done

## 2015-07-30 NOTE — Telephone Encounter (Signed)
Please schedule for labs, thanks

## 2015-07-31 ENCOUNTER — Other Ambulatory Visit: Payer: Medicare Other

## 2015-07-31 ENCOUNTER — Other Ambulatory Visit (INDEPENDENT_AMBULATORY_CARE_PROVIDER_SITE_OTHER): Payer: Medicare Other

## 2015-07-31 DIAGNOSIS — E559 Vitamin D deficiency, unspecified: Secondary | ICD-10-CM | POA: Diagnosis not present

## 2015-07-31 DIAGNOSIS — R7301 Impaired fasting glucose: Secondary | ICD-10-CM

## 2015-07-31 DIAGNOSIS — E038 Other specified hypothyroidism: Secondary | ICD-10-CM | POA: Diagnosis not present

## 2015-07-31 DIAGNOSIS — E785 Hyperlipidemia, unspecified: Secondary | ICD-10-CM | POA: Diagnosis not present

## 2015-07-31 DIAGNOSIS — E034 Atrophy of thyroid (acquired): Secondary | ICD-10-CM | POA: Diagnosis not present

## 2015-07-31 DIAGNOSIS — I10 Essential (primary) hypertension: Secondary | ICD-10-CM

## 2015-07-31 LAB — LIPID PANEL
CHOLESTEROL: 233 mg/dL — AB (ref 0–200)
HDL: 80.8 mg/dL (ref >39.00)
LDL Cholesterol: 137 mg/dL — ABNORMAL HIGH (ref 0–99)
NonHDL: 152.08
TRIGLYCERIDES: 76 mg/dL (ref 0.0–149.0)
Total CHOL/HDL Ratio: 3
VLDL: 15.2 mg/dL (ref 0.0–40.0)

## 2015-07-31 LAB — COMPREHENSIVE METABOLIC PANEL
ALBUMIN: 4.4 g/dL (ref 3.5–5.2)
ALK PHOS: 76 U/L (ref 39–117)
ALT: 7 U/L (ref 0–35)
AST: 13 U/L (ref 0–37)
BILIRUBIN TOTAL: 0.8 mg/dL (ref 0.2–1.2)
BUN: 18 mg/dL (ref 6–23)
CALCIUM: 10 mg/dL (ref 8.4–10.5)
CO2: 26 mEq/L (ref 19–32)
CREATININE: 1.05 mg/dL (ref 0.40–1.20)
Chloride: 103 mEq/L (ref 96–112)
GFR: 67.48 mL/min (ref >60.00)
Glucose, Bld: 123 mg/dL — ABNORMAL HIGH (ref 70–99)
Potassium: 4.4 mEq/L (ref 3.5–5.1)
Sodium: 138 mEq/L (ref 135–145)
TOTAL PROTEIN: 7.8 g/dL (ref 6.0–8.3)

## 2015-07-31 LAB — VITAMIN D 25 HYDROXY (VIT D DEFICIENCY, FRACTURES): VITD: 32.66 ng/mL (ref 30.00–100.00)

## 2015-07-31 LAB — LDL CHOLESTEROL, DIRECT: LDL DIRECT: 137 mg/dL

## 2015-07-31 LAB — TSH: TSH: 0.45 u[IU]/mL (ref 0.35–4.50)

## 2015-08-01 NOTE — Addendum Note (Signed)
Addended by: Crecencio Mc on: 08/01/2015 01:14 PM   Modules accepted: Orders

## 2015-08-03 ENCOUNTER — Other Ambulatory Visit: Payer: Self-pay | Admitting: Internal Medicine

## 2015-08-07 ENCOUNTER — Ambulatory Visit: Payer: Medicare Other | Admitting: Internal Medicine

## 2015-08-22 LAB — CUP PACEART REMOTE DEVICE CHECK: MDC IDC SESS DTM: 20170717074005

## 2015-08-28 ENCOUNTER — Ambulatory Visit (INDEPENDENT_AMBULATORY_CARE_PROVIDER_SITE_OTHER): Payer: Medicare Other | Admitting: *Deleted

## 2015-08-28 DIAGNOSIS — R55 Syncope and collapse: Secondary | ICD-10-CM | POA: Diagnosis not present

## 2015-08-28 NOTE — Progress Notes (Signed)
Carelink Summary Report / Loop Recorder 

## 2015-09-02 ENCOUNTER — Other Ambulatory Visit: Payer: Self-pay | Admitting: Internal Medicine

## 2015-09-10 ENCOUNTER — Ambulatory Visit (INDEPENDENT_AMBULATORY_CARE_PROVIDER_SITE_OTHER): Payer: Medicare Other | Admitting: Internal Medicine

## 2015-09-10 VITALS — BP 90/60 | HR 59 | Temp 98.1°F | Resp 12 | Ht 61.0 in | Wt 142.5 lb

## 2015-09-10 DIAGNOSIS — Z1231 Encounter for screening mammogram for malignant neoplasm of breast: Secondary | ICD-10-CM

## 2015-09-10 DIAGNOSIS — E038 Other specified hypothyroidism: Secondary | ICD-10-CM | POA: Diagnosis not present

## 2015-09-10 DIAGNOSIS — Z1239 Encounter for other screening for malignant neoplasm of breast: Secondary | ICD-10-CM

## 2015-09-10 DIAGNOSIS — Z23 Encounter for immunization: Secondary | ICD-10-CM | POA: Diagnosis not present

## 2015-09-10 DIAGNOSIS — I10 Essential (primary) hypertension: Secondary | ICD-10-CM

## 2015-09-10 DIAGNOSIS — R413 Other amnesia: Secondary | ICD-10-CM | POA: Diagnosis not present

## 2015-09-10 DIAGNOSIS — E034 Atrophy of thyroid (acquired): Secondary | ICD-10-CM

## 2015-09-10 NOTE — Progress Notes (Signed)
Subjective:  Patient ID: Christine Hull, female    DOB: [birth year]  Age: 65 y.o. MRN: [id number]  CC: The primary encounter diagnosis was Memory loss, short term. Diagnoses of Breast cancer screening, high risk patient, Encounter for immunization, Hypothyroidism due to acquired atrophy of thyroid, Essential hypertension, and Short-term memory loss were also pertinent to this visit.  HPI Christine Hull presents for [employer] EXAM BUT SHE HAS NOT HAD FOLLOW UP IN ONE YEAR ON HYPERTENSION, HYPOTHYROIDISM.    CC:  WORRIED ABOUT HER SHORT TERM MEMROY LOSS.   FAMILY IS ALSO CONCERNED ABOUT MEMORY LOSS  UNINTENTIONAL WEIGHT LOSS NOTED  Of 35 LB SINCE 2013 .  Skips breakfast and llunch a lot ,  Just a cracker in between .cooks fish,  Vegetables,  Fried chicken  .   IMPAIRED FASTING GLUCOSE.  NEEDS A1C  NEW ONSET HEADACHES,  AGGRAVATED BY RAINY WEATHER   MMSE ADMINISTERED  Outpatient Medications Prior to Visit  Medication Sig Dispense Refill  . levothyroxine (SYNTHROID, LEVOTHROID) 50 MCG tablet Take 1 tablet (50 mcg total) by mouth daily before breakfast. 90 tablet 1  . liothyronine (CYTOMEL) 5 MCG tablet TAKE 1 TABLET DAILY 90 tablet 1  . losartan-hydrochlorothiazide (HYZAAR) 50-12.5 MG tablet TAKE 1 TABLET DAILY 90 tablet 1  . fish oil-omega-3 fatty acids 1000 MG capsule Take 1 g by mouth daily.     . nadolol (CORGARD) 20 MG tablet TAKE 1 TABLET TWICE A DAY 180 tablet 1   No facility-administered medications prior to visit.     Review of Systems;  Patient denies headache, fevers, malaise, unintentional weight loss, skin rash, eye pain, sinus congestion and sinus pain, sore throat, dysphagia,  hemoptysis , cough, dyspnea, wheezing, chest pain, palpitations, orthopnea, edema, abdominal pain, nausea, melena, diarrhea, constipation, flank pain, dysuria, hematuria, urinary  Frequency, nocturia, numbness, tingling, seizures,  Focal weakness, Loss of consciousness,  Tremor, insomnia,  depression, anxiety, and suicidal ideation.      Objective:  BP 90/60   Pulse (!) 59   Temp 98.1 F (36.7 C) (Oral)   Resp 12   Ht 5\' 1"  (1.549 m)   Wt 142 lb 8 oz (64.6 kg)   SpO2 97%   BMI 26.93 kg/m   BP Readings from Last 3 Encounters:  09/10/15 90/60  09/13/14 104/70  11/11/13 105/60    Wt Readings from Last 3 Encounters:  09/10/15 142 lb 8 oz (64.6 kg)  09/13/14 151 lb 12 oz (68.8 kg)  05/09/13 160 lb (72.6 kg)    General appearance: alert, cooperative and appears stated age Ears: normal TM's and external ear canals both ears Throat: lips, mucosa, and tongue normal; teeth and gums normal Neck: no adenopathy, no carotid bruit, supple, symmetrical, trachea midline and thyroid not enlarged, symmetric, no tenderness/mass/nodules Back: symmetric, no curvature. ROM normal. No CVA tenderness. Lungs: clear to auscultation bilaterally Heart: regular rate and rhythm, S1, S2 normal, no murmur, click, rub or gallop Abdomen: soft, non-tender; bowel sounds normal; no masses,  no organomegaly Pulses: 2+ and symmetric Skin: Skin color, texture, turgor normal. No rashes or lesions Lymph nodes: Cervical, supraclavicular, and axillary nodes normal.  No results found for: HGBA1C  Lab Results  Component Value Date   CREATININE 1.05 07/31/2015   CREATININE 1.12 09/13/2014   CREATININE 0.98 11/11/2013    Lab Results  Component Value Date   WBC 5.3 11/11/2013   HGB 13.5 11/11/2013   HCT 39.8 11/11/2013   PLT 301 11/11/2013  GLUCOSE 123 (H) 07/31/2015   CHOL 233 (H) 07/31/2015   TRIG 76.0 07/31/2015   HDL 80.80 07/31/2015   LDLDIRECT 137.0 07/31/2015   LDLCALC 137 (H) 07/31/2015   ALT 7 07/31/2015   AST 13 07/31/2015   NA 138 07/31/2015   K 4.4 07/31/2015   CL 103 07/31/2015   CREATININE 1.05 07/31/2015   BUN 18 07/31/2015   CO2 26 07/31/2015   TSH 0.45 07/31/2015    No results found.  Assessment & Plan:   Problem List Items Addressed This Visit     Hypothyroidism    Thyroid function is WNL on current dose.  No current changes needed.   Lab Results  Component Value Date   TSH 0.45 07/31/2015         Short-term memory loss    Confirmed with MMSE done today.  She scored 22/30.  MRI of brain ordered.  Lab Results  Component Value Date   TSH 0.45 07/31/2015   Lab Results  Component Value Date   [id number] 09/10/2015   No results found for: RPR       Essential hypertension    Well controlled on current regimen.BP is actually low.  Reducing dose of nadolol to 10 mg twice daily        Other Visit Diagnoses    Memory loss, short term    -  Primary   Relevant Orders   MR Brain Wo Contrast   Methylmalonic Acid   Vitamin B12 (Completed)   RPR   Breast cancer screening, high risk patient       Relevant Orders   MM DIGITAL SCREENING BILATERAL   Encounter for immunization       Relevant Orders   Flu vaccine HIGH DOSE PF (Completed)      I am having Ms. Watford maintain her fish oil-omega-3 fatty acids, levothyroxine, nadolol, losartan-hydrochlorothiazide, and liothyronine.  No orders of the defined types were placed in this encounter.   There are no discontinued medications.  Follow-up: Return in about 4 weeks (around 10/08/2015), or follow up on memory .   Crecencio Mc, MD

## 2015-09-10 NOTE — Progress Notes (Signed)
Pre-visit discussion using our clinic review tool. No additional management support is needed unless otherwise documented below in the visit note.  

## 2015-09-10 NOTE — Patient Instructions (Addendum)
Please reduce your Corgard (nadolol) to 1/2 tablet two times daily.  YOUR BLOOD PRESSURE IS TOO LOW  I have ordered Additional  labs today to look for causes of decreased memory and to rule out diabetes  MRI OF BRAIN  TO BE SCHEDULED   You are OVERDUE for mammogram by 3 years so this is being ordered as well   You received the flu vaccine today  Return in one month for your annual exam

## 2015-09-11 LAB — RPR

## 2015-09-11 LAB — VITAMIN B12: VITAMIN B 12: 441 pg/mL (ref 211–911)

## 2015-09-11 NOTE — Assessment & Plan Note (Signed)
Confirmed with MMSE done today.  She scored 22/30.  MRI of brain ordered.  Lab Results  Component Value Date   TSH 0.45 07/31/2015   Lab Results  Component Value Date   [id number] 09/10/2015   No results found for: RPR

## 2015-09-11 NOTE — Assessment & Plan Note (Signed)
Well controlled on current regimen.BP is actually low.  Reducing dose of nadolol to 10 mg twice daily

## 2015-09-11 NOTE — Assessment & Plan Note (Signed)
Thyroid function is WNL on current dose.  No current changes needed.   Lab Results  Component Value Date   TSH 0.45 07/31/2015

## 2015-09-13 LAB — METHYLMALONIC ACID, SERUM: Methylmalonic Acid, Quant: 131 nmol/L (ref 87–318)

## 2015-09-19 ENCOUNTER — Encounter: Payer: Self-pay | Admitting: *Deleted

## 2015-09-19 NOTE — Progress Notes (Unsigned)
Tried several times to call patient and has not returned call so letter has been mailed to patient.

## 2015-09-21 ENCOUNTER — Ambulatory Visit: Payer: Medicare Other

## 2015-09-24 ENCOUNTER — Ambulatory Visit
Admission: RE | Admit: 2015-09-24 | Discharge: 2015-09-24 | Disposition: A | Discharge status: Home / Self Care | Payer: Medicare Other | Source: Ambulatory Visit | Attending: Internal Medicine | Admitting: Internal Medicine

## 2015-09-24 ENCOUNTER — Ambulatory Visit: Payer: Medicare Other

## 2015-09-24 DIAGNOSIS — Z1239 Encounter for other screening for malignant neoplasm of breast: Secondary | ICD-10-CM

## 2015-09-24 LAB — CUP PACEART REMOTE DEVICE CHECK: MDC IDC SESS DTM: 20170816080530

## 2015-09-27 ENCOUNTER — Ambulatory Visit (INDEPENDENT_AMBULATORY_CARE_PROVIDER_SITE_OTHER): Payer: Medicare Other | Admitting: *Deleted

## 2015-09-27 DIAGNOSIS — R55 Syncope and collapse: Secondary | ICD-10-CM

## 2015-09-27 NOTE — Progress Notes (Signed)
Carelink Summary Report / Loop Recorder 

## 2015-10-08 ENCOUNTER — Other Ambulatory Visit: Payer: Self-pay | Admitting: Internal Medicine

## 2015-10-19 LAB — CUP PACEART REMOTE DEVICE CHECK: MDC IDC SESS DTM: 20170915080709

## 2015-10-19 NOTE — Progress Notes (Signed)
Carelink summary report received. Battery status OK. Normal device function. No new symptom episodes, tachy episodes, brady, or pause episodes. No new AF episodes. Monthly summary reports and ROV/PRN 

## 2015-10-25 ENCOUNTER — Ambulatory Visit: Payer: Medicare Other | Admitting: Internal Medicine

## 2015-10-28 ENCOUNTER — Ambulatory Visit (INDEPENDENT_AMBULATORY_CARE_PROVIDER_SITE_OTHER): Payer: Medicare Other | Admitting: *Deleted

## 2015-10-28 DIAGNOSIS — R55 Syncope and collapse: Secondary | ICD-10-CM | POA: Diagnosis not present

## 2015-10-28 NOTE — Progress Notes (Signed)
Carelink Summary Report / Loop Recorder 

## 2015-10-30 ENCOUNTER — Other Ambulatory Visit: Payer: Self-pay | Admitting: Internal Medicine

## 2015-11-15 ENCOUNTER — Encounter: Payer: Self-pay | Admitting: Internal Medicine

## 2015-11-25 ENCOUNTER — Telehealth: Payer: Self-pay | Admitting: Cardiology

## 2015-11-25 NOTE — Telephone Encounter (Signed)
LMOVM for pt to return call. Need to tell pt what she needs to do the morning before she leaves for her MRI.

## 2015-11-26 ENCOUNTER — Ambulatory Visit (INDEPENDENT_AMBULATORY_CARE_PROVIDER_SITE_OTHER): Payer: Medicare Other | Admitting: *Deleted

## 2015-11-26 DIAGNOSIS — R55 Syncope and collapse: Secondary | ICD-10-CM | POA: Diagnosis not present

## 2015-11-26 NOTE — Progress Notes (Signed)
Carelink Summary Report / Loop Recorder 

## 2015-11-28 NOTE — Telephone Encounter (Signed)
Pt had MRI today and home monitor was current.

## 2015-11-28 NOTE — Telephone Encounter (Signed)
Radiology called and was informed pt home monitor had updated on 11-28-15 and pt was ok to do a MRI.

## 2015-11-28 NOTE — Telephone Encounter (Signed)
2nd attempt  LMOVM for pt to return call.  

## 2015-11-30 LAB — CUP PACEART REMOTE DEVICE CHECK
Date Time Interrogation Session: 20171015083947
Implantable Pulse Generator Implant Date: 20150123

## 2015-11-30 NOTE — Progress Notes (Signed)
Carelink summary report received. Battery status OK. Normal device function. No new symptom episodes, tachy episodes, brady, or pause episodes. No new AF episodes. Monthly summary reports and ROV/PRN 

## 2015-12-04 ENCOUNTER — Ambulatory Visit: Payer: Medicare Other

## 2015-12-19 ENCOUNTER — Telehealth: Payer: Self-pay | Admitting: Cardiology

## 2015-12-19 NOTE — Telephone Encounter (Signed)
Spoke w/ pt husband and requested that pt send a manual transmission w/ her home monitor b/c it has not updated in the last couple days.

## 2015-12-26 ENCOUNTER — Ambulatory Visit (INDEPENDENT_AMBULATORY_CARE_PROVIDER_SITE_OTHER): Payer: Medicare Other | Admitting: *Deleted

## 2015-12-26 DIAGNOSIS — R55 Syncope and collapse: Secondary | ICD-10-CM | POA: Diagnosis not present

## 2015-12-26 NOTE — Progress Notes (Signed)
Carelink Summary Report / Loop Recorder 

## 2016-01-03 ENCOUNTER — Telehealth: Payer: Self-pay | Admitting: *Deleted

## 2016-01-03 NOTE — Telephone Encounter (Signed)
Patient's husband returned call and spoke with Candice, CMA.  She walked him through manual transmission process, transmission successfully received and software update delivered.  No alerts or abnormalities on manual transmission, battery status: good.

## 2016-01-03 NOTE — Telephone Encounter (Signed)
[facility] requesting that patient send a manual transmission from her Carelink monitor.  LINQ requires software update, showing false RRT.  [facility] phone number for questions/concerns.

## 2016-01-09 ENCOUNTER — Encounter: Payer: Self-pay | Admitting: Internal Medicine

## 2016-01-09 LAB — CUP PACEART REMOTE DEVICE CHECK
Implantable Pulse Generator Implant Date: 20150123
MDC IDC SESS DTM: 20171114093640

## 2016-01-27 ENCOUNTER — Ambulatory Visit (INDEPENDENT_AMBULATORY_CARE_PROVIDER_SITE_OTHER): Payer: Medicare Other | Admitting: *Deleted

## 2016-01-27 DIAGNOSIS — R55 Syncope and collapse: Secondary | ICD-10-CM | POA: Diagnosis not present

## 2016-01-27 NOTE — Progress Notes (Signed)
Carelink Summary Report / Loop Recorder 

## 2016-02-03 ENCOUNTER — Other Ambulatory Visit: Payer: Self-pay | Admitting: Internal Medicine

## 2016-02-05 ENCOUNTER — Telehealth: Payer: Self-pay | Admitting: *Deleted

## 2016-02-05 ENCOUNTER — Telehealth: Payer: Self-pay | Admitting: Internal Medicine

## 2016-02-05 NOTE — Telephone Encounter (Signed)
Rx has been filled 

## 2016-02-05 NOTE — Telephone Encounter (Signed)
Pt requested a medication refill, see previous note

## 2016-02-05 NOTE — Telephone Encounter (Signed)
Pt called requesting a refill on nadolol (CORGARD) 20 MG tablet. Please advise, thank you!  Pharmacy - [id number] - [place], [place]  Call pt @ [phone]

## 2016-02-06 ENCOUNTER — Telehealth: Payer: Self-pay | Admitting: Radiology

## 2016-02-06 NOTE — Addendum Note (Signed)
Addended by: Crecencio Mc on: 02/06/2016 12:38 PM   Modules accepted: Orders

## 2016-02-06 NOTE — Telephone Encounter (Signed)
See previous note

## 2016-02-06 NOTE — Telephone Encounter (Signed)
Could you call patient to schedule 6 month follow up? Thank you.

## 2016-02-06 NOTE — Telephone Encounter (Signed)
Pt refill request for nadolol (Caorgard) 20 mg 60 tab no refills was called in today. Spoke with pharmacy tech to confirm.

## 2016-02-06 NOTE — Telephone Encounter (Signed)
AGREE.  NEEDS 6 MONTH FOLLOW UP IN [place]

## 2016-02-06 NOTE — Telephone Encounter (Signed)
[facility] stated [facility] was not receive [facility]

## 2016-02-06 NOTE — Telephone Encounter (Signed)
It was called in today. It looks like the electronic transmission failed yesterday.

## 2016-02-16 LAB — CUP PACEART REMOTE DEVICE CHECK
Implantable Pulse Generator Implant Date: 20150123
MDC IDC SESS DTM: 20171214093952

## 2016-02-16 NOTE — Progress Notes (Signed)
Carelink summary report received. Battery status OK. Normal device function. No new symptom episodes, tachy episodes, brady, or pause episodes. No new AF episodes. Monthly summary reports and ROV/PRN 

## 2016-02-18 ENCOUNTER — Encounter: Payer: Self-pay | Admitting: Internal Medicine

## 2016-02-18 NOTE — Telephone Encounter (Deleted)
Left VM to discuss episode on LINQ.

## 2016-02-20 ENCOUNTER — Telehealth: Payer: Self-pay | Admitting: *Deleted

## 2016-02-20 NOTE — Telephone Encounter (Signed)
LMOM requested call back to device clinic, following up on attempted call on 02/18/16 3:42.

## 2016-02-24 ENCOUNTER — Ambulatory Visit (INDEPENDENT_AMBULATORY_CARE_PROVIDER_SITE_OTHER): Payer: Medicare Other | Admitting: *Deleted

## 2016-02-24 DIAGNOSIS — R55 Syncope and collapse: Secondary | ICD-10-CM

## 2016-02-24 NOTE — Progress Notes (Signed)
Carelink Summary Report / Loop Recorder 

## 2016-02-28 NOTE — Telephone Encounter (Signed)
Spoke with patient about 4 second pause episode on 02/17/16. Patient asymptomatic, states she was asleep at this time. Patient states taking her medication as scheduled. Episode printed and put in MD folder for review.

## 2016-03-06 ENCOUNTER — Other Ambulatory Visit: Payer: Self-pay | Admitting: Internal Medicine

## 2016-03-07 LAB — CUP PACEART REMOTE DEVICE CHECK
Date Time Interrogation Session: 20180113103652
MDC IDC PG IMPLANT DT: 20150123

## 2016-03-07 NOTE — Progress Notes (Signed)
Carelink summary report received. Battery status OK. Normal device function. No new symptom episodes, tachy episodes, brady, or pause episodes. No new AF episodes. Monthly summary reports and ROV/PRN 

## 2016-03-16 LAB — CUP PACEART REMOTE DEVICE CHECK
Implantable Pulse Generator Implant Date: 20150123
MDC IDC SESS DTM: 20180212110714

## 2016-03-16 NOTE — Progress Notes (Signed)
Carelink summary report received. Battery status OK. Normal device function. No new symptom episodes, tachy episodes, or brady episodes. No new AF episodes. 1 pause- ECG appears true 4 sec pause @ 0521, pt denied symptoms per EPIC, states was sleeping. Monthly summary reports and ROV/PRN

## 2016-03-25 ENCOUNTER — Ambulatory Visit (INDEPENDENT_AMBULATORY_CARE_PROVIDER_SITE_OTHER): Payer: Medicare Other | Admitting: *Deleted

## 2016-03-25 DIAGNOSIS — R55 Syncope and collapse: Secondary | ICD-10-CM

## 2016-03-26 NOTE — Progress Notes (Signed)
Carelink Summary Report / Loop Recorder 

## 2016-04-02 LAB — CUP PACEART REMOTE DEVICE CHECK
Date Time Interrogation Session: 20180314113714
MDC IDC PG IMPLANT DT: 20150123

## 2016-04-02 NOTE — Progress Notes (Signed)
Carelink summary report received. Battery status OK. Normal device function. No new symptom episodes, tachy episodes, brady, or pause episodes. No new AF episodes. Monthly summary reports and ROV/PRN 

## 2016-04-10 ENCOUNTER — Ambulatory Visit (INDEPENDENT_AMBULATORY_CARE_PROVIDER_SITE_OTHER): Payer: Medicare Other | Admitting: Internal Medicine

## 2016-04-10 ENCOUNTER — Encounter: Payer: Self-pay | Admitting: Internal Medicine

## 2016-04-10 VITALS — BP 130/80 | HR 70 | Ht 63.0 in | Wt 146.0 lb

## 2016-04-10 DIAGNOSIS — R55 Syncope and collapse: Secondary | ICD-10-CM

## 2016-04-10 MED ORDER — LOSARTAN POTASSIUM 50 MG PO TABS: 50.0000 mg | ORAL_TABLET | Freq: Every day | ORAL | 3 refills | Status: DC

## 2016-04-10 NOTE — Progress Notes (Signed)
      Patient Care Team: Crecencio Mc, MD as PCP - General (Internal Medicine)   HPI  Christine Hull is a 66 y.o. female Seen in follow-up for syncope and concern for QT prolongation. She underwent loop recorder implantation. she says that the marked monitor his movement across her chest.  No sycnope  BP meds changed per PCP  Records and Results Reviewed  Past Medical History:  Diagnosis Date  . Heart murmur   . Hypertension   . Hypothyroidism   . [employer] LINQ 05/09/2013  . Near syncope 02/02/2013  . Prolonged QT interval 02/02/2013  . Sinus bradycardia 05/09/2013    Past Surgical History:  Procedure Laterality Date  . AUGMENTATION MAMMAPLASTY Bilateral 1990's   "had implants put in; had them taken out 6 months later" (02/02/2013)  . BREAST BIOPSY Right 2011   Sankar,  normal  . LOOP RECORDER IMPLANT N/A 02/03/2013   Procedure: LOOP RECORDER IMPLANT;  Surgeon: Deboraha Sprang, MD;  Location: [facility] CATH LAB;  Service: Cardiovascular;  Laterality: N/A;    Current Outpatient Prescriptions  Medication Sig Dispense Refill  . fish oil-omega-3 fatty acids 1000 MG capsule Take 1 g by mouth daily.     Marland Kitchen levothyroxine (SYNTHROID, LEVOTHROID) 50 MCG tablet TAKE 1 TABLET DAILY BEFORE BREAKFAST 90 tablet 1  . liothyronine (CYTOMEL) 5 MCG tablet TAKE 1 TABLET DAILY 90 tablet 0  . losartan-hydrochlorothiazide (HYZAAR) 50-12.5 MG tablet TAKE 1 TABLET DAILY 90 tablet 1  . nadolol (CORGARD) 20 MG tablet TAKE 1 TABLET BY MOUTH TWICE A DAY 60 tablet 0   No current facility-administered medications for this visit.     No Known Allergies    Review of Systems negative except from HPI and PMH  Physical Exam BP 130/80   Pulse 70   Ht 5\' 3"  (1.6 m)   Wt 146 lb (66.2 kg)   SpO2 99%   BMI 25.86 kg/m  Well developed and well nourished in no acute distress HENT normal E scleral and icterus clear Neck Supple JVP flat; carotids brisk and full Clear to ausculation  Loop recorder in  place. Unable to make a move *Regular rate and rhythm, no murmurs gallops or rub Soft with active bowel sounds No clubbing cyanosis  Edema Alert and oriented, grossly normal motor and sensory function Skin Warm and Dry  ECG demonstrates sinus rhythm at 61 Intervals 16/08/44 with  QTC of 44  Assessment and  Plan Syncope without recurrence  QT prolongation  Hypertension  Implantable loop recorder    No recurrent syncope.  Loop recorder is nearing ERI I. We will remove it at her request. At this point she is not sure she wants to do that.  Her blood pressure is elevated; given the possibility of long QT syndrome, we will discontinue the hydrochlorothiazide portion of her antihypertensives. We have given her and her husband the QT drugs.org website Linq  We will encourage their daughter to get assessed.       Current medicines are reviewed at length with the patient today .  The patient does not have concerns regarding medicines.

## 2016-04-10 NOTE — Patient Instructions (Signed)
Medication Instructions: Your physician has recommended you make the following change in your medication  --1) STOP Hyzaar (Losartan Potassium HCTZ) --2) START Cozaar (Losartan Potassium)  50 mg - Take 1 tablet by mouth daily.   Labwork: None Ordered  Procedures/Testing: None ordered  Follow-Up: Your physician recommends that you schedule a follow-up appointment as needed with Dr. Caryl Comes  Any Additional Special Instructions Will Be Listed Below (If Applicable).  --If you want to have your loop recorder explanted (taken out) please contact our office at [phone] and ask to schedule with Alvis Lemmings, Dr. Olin Pia Nurse. If she is not available a nurse will have your message request sent to her.    If you need a refill on your cardiac medications before your next appointment, please call your pharmacy.

## 2016-04-20 ENCOUNTER — Other Ambulatory Visit: Payer: Self-pay | Admitting: Internal Medicine

## 2016-04-24 ENCOUNTER — Ambulatory Visit (INDEPENDENT_AMBULATORY_CARE_PROVIDER_SITE_OTHER): Payer: Medicare Other | Admitting: *Deleted

## 2016-04-24 DIAGNOSIS — R55 Syncope and collapse: Secondary | ICD-10-CM | POA: Diagnosis not present

## 2016-04-24 NOTE — Progress Notes (Signed)
Carelink Summary Report / Loop Recorder 

## 2016-04-27 ENCOUNTER — Encounter: Payer: Self-pay | Admitting: Internal Medicine

## 2016-04-27 ENCOUNTER — Ambulatory Visit (INDEPENDENT_AMBULATORY_CARE_PROVIDER_SITE_OTHER): Payer: Medicare Other | Admitting: Internal Medicine

## 2016-04-27 ENCOUNTER — Other Ambulatory Visit: Payer: Self-pay

## 2016-04-27 VITALS — BP 122/90 | HR 55 | Temp 98.0°F | Resp 15 | Ht 63.0 in | Wt 146.6 lb

## 2016-04-27 DIAGNOSIS — R413 Other amnesia: Secondary | ICD-10-CM | POA: Diagnosis not present

## 2016-04-27 DIAGNOSIS — E559 Vitamin D deficiency, unspecified: Secondary | ICD-10-CM

## 2016-04-27 DIAGNOSIS — E785 Hyperlipidemia, unspecified: Secondary | ICD-10-CM

## 2016-04-27 DIAGNOSIS — Z1231 Encounter for screening mammogram for malignant neoplasm of breast: Secondary | ICD-10-CM

## 2016-04-27 DIAGNOSIS — F1010 Alcohol abuse, uncomplicated: Secondary | ICD-10-CM

## 2016-04-27 DIAGNOSIS — F1019 Alcohol abuse with unspecified alcohol-induced disorder: Secondary | ICD-10-CM

## 2016-04-27 DIAGNOSIS — R4189 Other symptoms and signs involving cognitive functions and awareness: Secondary | ICD-10-CM

## 2016-04-27 DIAGNOSIS — E538 Deficiency of other specified B group vitamins: Secondary | ICD-10-CM | POA: Diagnosis not present

## 2016-04-27 DIAGNOSIS — E039 Hypothyroidism, unspecified: Secondary | ICD-10-CM | POA: Diagnosis not present

## 2016-04-27 DIAGNOSIS — Z1239 Encounter for other screening for malignant neoplasm of breast: Secondary | ICD-10-CM

## 2016-04-27 DIAGNOSIS — I10 Essential (primary) hypertension: Secondary | ICD-10-CM

## 2016-04-27 LAB — CBC WITH DIFFERENTIAL/PLATELET
BASOS ABS: 0 10*3/uL (ref 0.0–0.1)
BASOS PCT: 0.3 % (ref 0.0–3.0)
EOS ABS: 0.1 10*3/uL (ref 0.0–0.7)
Eosinophils Relative: 1.8 % (ref 0.0–5.0)
HCT: 37.1 % (ref 36.0–46.0)
HEMOGLOBIN: 12.4 g/dL (ref 12.0–15.0)
Lymphocytes Relative: 35.2 % (ref 12.0–46.0)
Lymphs Abs: 1.6 10*3/uL (ref 0.7–4.0)
MCHC: 33.5 g/dL (ref 30.0–36.0)
MCV: 99.9 fl (ref 78.0–100.0)
Monocytes Absolute: 0.2 10*3/uL (ref 0.1–1.0)
Monocytes Relative: 4.5 % (ref 3.0–12.0)
Neutro Abs: 2.7 10*3/uL (ref 1.4–7.7)
Neutrophils Relative %: 58.2 % (ref 43.0–77.0)
Platelets: 317 10*3/uL (ref 150.0–400.0)
RBC: 3.71 Mil/uL — AB (ref 3.87–5.11)
RDW: 13.1 % (ref 11.5–15.5)
WBC: 4.6 10*3/uL (ref 4.0–10.5)

## 2016-04-27 LAB — COMPREHENSIVE METABOLIC PANEL
ALT: 11 U/L (ref 0–35)
AST: 20 U/L (ref 0–37)
Albumin: 4.1 g/dL (ref 3.5–5.2)
Alkaline Phosphatase: 87 U/L (ref 39–117)
BUN: 10 mg/dL (ref 6–23)
CHLORIDE: 109 meq/L (ref 96–112)
CO2: 26 mEq/L (ref 19–32)
Calcium: 9.7 mg/dL (ref 8.4–10.5)
Creatinine, Ser: 0.94 mg/dL (ref 0.40–1.20)
GFR: 76.5 mL/min (ref >60.00)
GLUCOSE: 94 mg/dL (ref 70–99)
POTASSIUM: 3.9 meq/L (ref 3.5–5.1)
SODIUM: 140 meq/L (ref 135–145)
Total Bilirubin: 0.7 mg/dL (ref 0.2–1.2)
Total Protein: 7.5 g/dL (ref 6.0–8.3)

## 2016-04-27 LAB — TSH: TSH: 21.58 u[IU]/mL — AB (ref 0.35–4.50)

## 2016-04-27 LAB — VITAMIN D 25 HYDROXY (VIT D DEFICIENCY, FRACTURES): VITD: 28.3 ng/mL — ABNORMAL LOW (ref 30.00–100.00)

## 2016-04-27 LAB — LIPID PANEL
CHOL/HDL RATIO: 3
Cholesterol: 229 mg/dL — ABNORMAL HIGH (ref 0–200)
HDL: 69.3 mg/dL (ref >39.00)
LDL CALC: 144 mg/dL — AB (ref 0–99)
NONHDL: 159.2
Triglycerides: 74 mg/dL (ref 0.0–149.0)
VLDL: 14.8 mg/dL (ref 0.0–40.0)

## 2016-04-27 LAB — VITAMIN B12: VITAMIN B 12: 340 pg/mL (ref 211–911)

## 2016-04-27 NOTE — Progress Notes (Signed)
Pre visit review using our clinic review tool, if applicable. No additional management support is needed unless otherwise documented below in the visit note. 

## 2016-04-27 NOTE — Telephone Encounter (Signed)
Error

## 2016-04-27 NOTE — Patient Instructions (Signed)
You are VERY OVERDUE FOR MAMMOGRAM.  You missed last year's scheduled appointment  I have reordered it today

## 2016-04-27 NOTE — Progress Notes (Signed)
Subjective:  Patient ID: Christine Hull, female    DOB: [birth year]  Age: 66 y.o. MRN: [id number]  CC: The primary encounter diagnosis was Breast cancer screening. Diagnoses of Cognitive changes, Hyperlipidemia LDL goal <100, Essential hypertension, Alcohol abuse with alcohol-induced disorder ([place]), Hypothyroidism (acquired), Vitamin D deficiency, B12 deficiency, Short-term memory loss, Alcohol abuse, and Acquired hypothyroidism were also pertinent to this visit.  HPI Christine Hull presents for FOLLOW UP ON HYPERTENSION, HYPOTHYROID, tobacco abuse and memory loss   TAKING KRILL OIL  HAS REDUCED HER TOBACCO TO 4 cigarettes daily,  Smokes . AFTER DINNER WHILE WATCHING TV.   NOT SLEEPING WELL (CHRONIC) falls asleep on the couch.  Gets up and goes to bed around 12:15 ,  Retired , but wakes up at  5:00 out of habit .  No daytime naps,  Not sleepy  during the day.   Likes to clean house and  walk on treadmill  during the day   Did not go for mammogram last year,  Last one was in 2013   120/78 recheck bp today   Lab Results  Component Value Date   TSH 21.58 (H) 04/27/2016   No results found for: HGBA1C Lab Results  Component Value Date   CREATININE 0.94 04/27/2016   Lab Results  Component Value Date   CHOL 229 (H) 04/27/2016   HDL 69.30 04/27/2016   LDLCALC 144 (H) 04/27/2016   LDLDIRECT 137.0 07/31/2015   TRIG 74.0 04/27/2016   CHOLHDL 3 04/27/2016   Lab Results  Component Value Date   ALT 11 04/27/2016   AST 20 04/27/2016   ALKPHOS 87 04/27/2016   BILITOT 0.7 04/27/2016      Outpatient Medications Prior to Visit  Medication Sig Dispense Refill  . fish oil-omega-3 fatty acids 1000 MG capsule Take 1 g by mouth daily.     Marland Kitchen liothyronine (CYTOMEL) 5 MCG tablet TAKE 1 TABLET DAILY 90 tablet 0  . levothyroxine (SYNTHROID, LEVOTHROID) 50 MCG tablet TAKE 1 TABLET DAILY BEFORE BREAKFAST 90 tablet 0  . losartan (COZAAR) 50 MG tablet Take 1 tablet (50 mg total) by mouth  daily. 90 tablet 3  . nadolol (CORGARD) 20 MG tablet TAKE 1 TABLET BY MOUTH TWICE A DAY 60 tablet 0   No facility-administered medications prior to visit.     Review of Systems;  Patient denies headache, fevers, malaise, unintentional weight loss, skin rash, eye pain, sinus congestion and sinus pain, sore throat, dysphagia,  hemoptysis , cough, dyspnea, wheezing, chest pain, palpitations, orthopnea, edema, abdominal pain, nausea, melena, diarrhea, constipation, flank pain, dysuria, hematuria, urinary  Frequency, nocturia, numbness, tingling, seizures,  Focal weakness, Loss of consciousness,  Tremor, insomnia, depression, anxiety, and suicidal ideation.      Objective:  BP 122/90   Pulse (!) 55   Temp 98 F (36.7 C) (Oral)   Resp 15   Ht 5\' 3"  (1.6 m)   Wt 146 lb 9.6 oz (66.5 kg)   SpO2 98%   BMI 25.97 kg/m   BP Readings from Last 3 Encounters:  04/27/16 122/90  04/10/16 130/80  09/10/15 90/60    Wt Readings from Last 3 Encounters:  04/27/16 146 lb 9.6 oz (66.5 kg)  04/10/16 146 lb (66.2 kg)  09/10/15 142 lb 8 oz (64.6 kg)    General appearance: alert, cooperative and appears stated age Ears: normal TM's and external ear canals both ears Throat: lips, mucosa, and tongue normal; teeth and gums normal Neck: no adenopathy, no  carotid bruit, supple, symmetrical, trachea midline and thyroid not enlarged, symmetric, no tenderness/mass/nodules Back: symmetric, no curvature. ROM normal. No CVA tenderness. Lungs: clear to auscultation bilaterally Heart: regular rate and rhythm, S1, S2 normal, no murmur, click, rub or gallop Abdomen: soft, non-tender; bowel sounds normal; no masses,  no organomegaly Pulses: 2+ and symmetric Skin: Skin color, texture, turgor normal. No rashes or lesions Lymph nodes: Cervical, supraclavicular, and axillary nodes normal.  No results found for: HGBA1C  Lab Results  Component Value Date   CREATININE 0.94 04/27/2016   CREATININE 1.05 07/31/2015    CREATININE 1.12 09/13/2014    Lab Results  Component Value Date   WBC 4.6 04/27/2016   HGB 12.4 04/27/2016   HCT 37.1 04/27/2016   PLT 317.0 04/27/2016   GLUCOSE 94 04/27/2016   CHOL 229 (H) 04/27/2016   TRIG 74.0 04/27/2016   HDL 69.30 04/27/2016   LDLDIRECT 137.0 07/31/2015   LDLCALC 144 (H) 04/27/2016   ALT 11 04/27/2016   AST 20 04/27/2016   NA 140 04/27/2016   K 3.9 04/27/2016   CL 109 04/27/2016   CREATININE 0.94 04/27/2016   BUN 10 04/27/2016   CO2 26 04/27/2016   TSH 21.58 (H) 04/27/2016    No results found.  Assessment & Plan:   Problem List Items Addressed This Visit    Acquired hypothyroidism    Thyroid function is very underactive on current dose.  I suspect she is forgetting to take her medication . Increase dose to 75 mcg daily and repeat tsh in 6 weeks  Lab Results  Component Value Date   TSH 21.58 (H) 04/27/2016         Relevant Medications   levothyroxine (SYNTHROID, LEVOTHROID) 75 MCG tablet   Alcohol abuse    She continues to deny excessive use of alcohol      Essential hypertension    Slight loss of control on current regimen oulse is low,  She has continued to take 20 mg nadolol despit reducing dose to 10 mg at last visit   Will increase losartan to 100 mg and continue 10 mg nadolol  mg twice daily       Relevant Medications   losartan (COZAAR) 100 MG tablet   Other Relevant Orders   Comprehensive metabolic panel (Completed)   Hyperlipidemia LDL goal <100    Has been taking krill oil  Lab Results  Component Value Date   CHOL 229 (H) 04/27/2016   HDL 69.30 04/27/2016   LDLCALC 144 (H) 04/27/2016   LDLDIRECT 137.0 07/31/2015   TRIG 74.0 04/27/2016   CHOLHDL 3 04/27/2016   10 yr risk  Of CAD  is 15% using FRC will recommend statin therapy .      Relevant Medications   losartan (COZAAR) 100 MG tablet   Other Relevant Orders   Lipid panel (Completed)   Short-term memory loss    Confirmed with MMSE score of  22/30 at last visit  in august.2017.Marland Kitchen  MRI of brain was ordered last August but not done, as was her mammogra.Christine Hull she is not taking her medications regularly either given her elevated tsh   Lab Results  Component Value Date   TSH 21.58 (H) 04/27/2016   Lab Results  Component Value Date   VITAMINB12 340 04/27/2016   No results found for: RPR        Other Visit Diagnoses    Breast cancer screening    -  Primary   Relevant Orders  MM SCREENING BREAST TOMO BILATERAL   Cognitive changes       Alcohol abuse with alcohol-induced disorder (HCC)       Relevant Orders   RBC Folate (Completed)   Hypothyroidism (acquired)       Relevant Medications   levothyroxine (SYNTHROID, LEVOTHROID) 75 MCG tablet   Other Relevant Orders   TSH (Completed)   Vitamin D deficiency       Relevant Orders   CBC with Differential/Platelet (Completed)   VITAMIN D 25 Hydroxy (Vit-D Deficiency, Fractures) (Completed)   B12 deficiency       Relevant Orders   Vitamin B12 (Completed)      I have discontinued Ms. Cleavenger's nadolol. I have also changed her levothyroxine and losartan. Additionally, I am having her maintain her fish oil-omega-3 fatty acids and liothyronine.  Meds ordered this encounter  Medications  . levothyroxine (SYNTHROID, LEVOTHROID) 75 MCG tablet    Sig: Take 1 tablet (75 mcg total) by mouth daily before breakfast.    Dispense:  90 tablet    Refill:  0  . losartan (COZAAR) 100 MG tablet    Sig: Take 1 tablet (100 mg total) by mouth daily.    Dispense:  90 tablet    Refill:  1    Dose increase,    Medications Discontinued During This Encounter  Medication Reason  . levothyroxine (SYNTHROID, LEVOTHROID) 50 MCG tablet Reorder  . losartan (COZAAR) 50 MG tablet Reorder  . nadolol (CORGARD) 20 MG tablet     Follow-up: No Follow-up on file.   Crecencio Mc, MD

## 2016-04-28 ENCOUNTER — Encounter: Payer: Self-pay | Admitting: Internal Medicine

## 2016-04-28 ENCOUNTER — Other Ambulatory Visit: Payer: Self-pay | Admitting: Internal Medicine

## 2016-04-28 DIAGNOSIS — E039 Hypothyroidism, unspecified: Secondary | ICD-10-CM

## 2016-04-28 DIAGNOSIS — E785 Hyperlipidemia, unspecified: Secondary | ICD-10-CM | POA: Insufficient documentation

## 2016-04-28 LAB — FOLATE RBC: RBC Folate: 338 ng/mL (ref >280)

## 2016-04-28 MED ORDER — LEVOTHYROXINE SODIUM 75 MCG PO TABS: 75.0000 ug | ORAL_TABLET | Freq: Every day | ORAL | 0 refills | Status: DC

## 2016-04-28 MED ORDER — NADOLOL 20 MG PO TABS: 10.0000 mg | ORAL_TABLET | Freq: Two times a day (BID) | ORAL | 0 refills | Status: DC

## 2016-04-28 MED ORDER — LOSARTAN POTASSIUM 100 MG PO TABS: 100.0000 mg | ORAL_TABLET | Freq: Every day | ORAL | 1 refills | Status: DC

## 2016-04-28 NOTE — Assessment & Plan Note (Signed)
Has been taking krill oil  Lab Results  Component Value Date   CHOL 229 (H) 04/27/2016   HDL 69.30 04/27/2016   LDLCALC 144 (H) 04/27/2016   LDLDIRECT 137.0 07/31/2015   TRIG 74.0 04/27/2016   CHOLHDL 3 04/27/2016   10 yr risk  Of CAD  is 15% using FRC will recommend statin therapy .

## 2016-04-28 NOTE — Assessment & Plan Note (Addendum)
Slight loss of control on current regimen oulse is low,  She has continued to take 20 mg nadolol despit reducing dose to 10 mg at last visit   Will increase losartan to 100 mg and continue 10 mg nadolol  mg twice daily

## 2016-04-28 NOTE — Assessment & Plan Note (Signed)
Thyroid function is very underactive on current dose.  I suspect she is forgetting to take her medication . Increase dose to 75 mcg daily and repeat tsh in 6 weeks  Lab Results  Component Value Date   TSH 21.58 (H) 04/27/2016

## 2016-04-28 NOTE — Assessment & Plan Note (Signed)
She continues to deny excessive use of alcohol

## 2016-04-28 NOTE — Telephone Encounter (Signed)
Sent, thanks

## 2016-04-28 NOTE — Assessment & Plan Note (Signed)
implantable loop recorder has been interrogated monthly since implantation in 2015 for long QT INTERVAL AND NEAR SYNCOPE

## 2016-04-28 NOTE — Assessment & Plan Note (Addendum)
Confirmed with MMSE score of  22/30 at last visit in august.2017.Marland Kitchen  MRI of brain was ordered last August but not done, as was her mammogra.Christine Hull she is not taking her medications regularly either given her elevated tsh   Lab Results  Component Value Date   TSH 21.58 (H) 04/27/2016   Lab Results  Component Value Date   VITAMINB12 340 04/27/2016   No results found for: RPR

## 2016-04-28 NOTE — Telephone Encounter (Signed)
CMP looks normal is it ok to go ahead and fill the medication?

## 2016-04-29 ENCOUNTER — Telehealth: Payer: Self-pay

## 2016-04-29 NOTE — Telephone Encounter (Signed)
Please call pt at [phone]

## 2016-04-29 NOTE — Telephone Encounter (Signed)
Spoke with pt and informed her of her lab results and medication changes. Scheduled the pt a lab appt and a nurse visit for bp check. The pt gave a verbal understanding and wrote the appt date and times down.

## 2016-04-29 NOTE — Telephone Encounter (Signed)
Attempted to call pt at number provided below. No answer, no voicemail. Will try again later.

## 2016-04-29 NOTE — Telephone Encounter (Signed)
LMTCB regarding lab results.  

## 2016-04-29 NOTE — Telephone Encounter (Signed)
Patient requested a medication refill for nadolol [facility]acy [facility] [place]

## 2016-04-29 NOTE — Telephone Encounter (Signed)
-----   Message from Crecencio Mc, MD sent at 04/28/2016  9:37 PM EDT ----- I refilled her nadolol but I want her to reduce  her dose to 10 mg (1/2 tablet) once daily,  And increase her losartan to 100 mg daily.  This will lower her blood pressure and keep her heart rate from getting too slow as it was at her visit. Her  thyroid function is very underactive on current levothyroxine dose of 50 mcg daily.  Is it possible she has been forgetting to take it? Or taking it incorrectly? .  Will increase dose to 75 mcg daily and repeat TSH in [redacted] weeks along with a bp check by rn.

## 2016-05-06 LAB — CUP PACEART REMOTE DEVICE CHECK
Date Time Interrogation Session: 20180413120727
MDC IDC PG IMPLANT DT: 20150123

## 2016-05-21 ENCOUNTER — Other Ambulatory Visit: Payer: Self-pay | Admitting: Internal Medicine

## 2016-05-21 ENCOUNTER — Ambulatory Visit
Admission: RE | Admit: 2016-05-21 | Discharge: 2016-05-21 | Disposition: A | Discharge status: Home / Self Care | Payer: Medicare Other | Source: Ambulatory Visit | Attending: Internal Medicine | Admitting: Internal Medicine

## 2016-05-21 DIAGNOSIS — Z1239 Encounter for other screening for malignant neoplasm of breast: Secondary | ICD-10-CM

## 2016-05-21 DIAGNOSIS — Z1231 Encounter for screening mammogram for malignant neoplasm of breast: Secondary | ICD-10-CM | POA: Insufficient documentation

## 2016-05-25 ENCOUNTER — Ambulatory Visit (INDEPENDENT_AMBULATORY_CARE_PROVIDER_SITE_OTHER): Payer: Medicare Other | Admitting: *Deleted

## 2016-05-25 DIAGNOSIS — R55 Syncope and collapse: Secondary | ICD-10-CM

## 2016-05-25 NOTE — Progress Notes (Signed)
Carelink Summary Report / Loop Recorder 

## 2016-05-29 LAB — CUP PACEART REMOTE DEVICE CHECK
MDC IDC PG IMPLANT DT: 20150123
MDC IDC SESS DTM: 20180513120605

## 2016-06-09 ENCOUNTER — Encounter: Payer: Self-pay | Admitting: Internal Medicine

## 2016-06-11 ENCOUNTER — Ambulatory Visit (INDEPENDENT_AMBULATORY_CARE_PROVIDER_SITE_OTHER): Payer: Medicare Other

## 2016-06-11 ENCOUNTER — Other Ambulatory Visit: Payer: Medicare Other

## 2016-06-11 VITALS — BP 140/78 | HR 50 | Resp 18

## 2016-06-11 DIAGNOSIS — I10 Essential (primary) hypertension: Secondary | ICD-10-CM | POA: Diagnosis not present

## 2016-06-11 NOTE — Progress Notes (Signed)
Patient came in for a BP check, one month per the note in the chart on 04/29/2016.  Patient has NOT taken her medications this am.  I advised that prior to any visit you need to take your scheduled medications so we can see how they are working.  She verbalized understanding. Checked BP in bilateral upper extremities.  See vital section for details.  Thanks

## 2016-06-12 ENCOUNTER — Telehealth: Payer: Self-pay | Admitting: *Deleted

## 2016-06-12 MED ORDER — LEVOTHYROXINE SODIUM 75 MCG PO TABS: 75.0000 ug | ORAL_TABLET | Freq: Every day | ORAL | 0 refills | Status: DC

## 2016-06-12 NOTE — Telephone Encounter (Addendum)
LM (detailed, DPR) advising that patient's LINQ is nearing EOS.  Advised that per Dr. Olin Pia 04/10/16 OV note, there is no current plan for explant so I ordered a return kit for her home monitor to her address on file.  [facility] phone number for questions or concerns.  Unenrolled from [place], return kit ordered.

## 2016-06-12 NOTE — Telephone Encounter (Signed)
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  Her insurance may not pay for it early , but I will refill it.

## 2016-06-12 NOTE — Telephone Encounter (Signed)
Patient has requested a medication refill for levothyroxine  [facility] [place]  Pt contact [phone] -Cell

## 2016-06-12 NOTE — Telephone Encounter (Signed)
Called pt to see why she was needing this medication refilled already because she should have enough to get her through till July 19th. The pt stated that she doesn't remember getting this medication. She then went on to say that the [facility]. Asked her where she gets her thyroid medication from and she stated that she would have to go home and look for it and talk to her husband. Pt stated that she was on [facility] way back [facility] while I was talking to her. Pt seemed to be a bit confused.

## 2016-06-18 NOTE — Progress Notes (Signed)
  I have reviewed the above information and agree with above.   Winnell Bento, MD 

## 2016-07-17 ENCOUNTER — Ambulatory Visit
Admission: RE | Admit: 2016-07-17 | Discharge: 2016-07-17 | Disposition: A | Discharge status: Home / Self Care | Payer: Medicare Other | Source: Ambulatory Visit | Attending: Internal Medicine | Admitting: Internal Medicine

## 2016-07-17 DIAGNOSIS — I6782 Cerebral ischemia: Secondary | ICD-10-CM | POA: Diagnosis not present

## 2016-07-17 DIAGNOSIS — R413 Other amnesia: Secondary | ICD-10-CM | POA: Diagnosis not present

## 2016-07-19 ENCOUNTER — Encounter: Payer: Self-pay | Admitting: Internal Medicine

## 2016-07-19 DIAGNOSIS — I6930 Unspecified sequelae of cerebral infarction: Secondary | ICD-10-CM | POA: Insufficient documentation

## 2016-08-03 ENCOUNTER — Telehealth: Payer: Self-pay | Admitting: *Deleted

## 2016-08-03 NOTE — Telephone Encounter (Signed)
I spoke with the patient's daughter . She will come in with the patient on Thursday 7.26.18 @ 8 to go over MRI results as Dr. Derrel Nip had requested.

## 2016-08-03 NOTE — Telephone Encounter (Signed)
Pt daughter has requested MRI results  Please call daughter Geni Bers [phone]

## 2016-08-06 ENCOUNTER — Other Ambulatory Visit: Payer: Self-pay

## 2016-08-06 ENCOUNTER — Encounter: Payer: Self-pay | Admitting: Internal Medicine

## 2016-08-06 ENCOUNTER — Other Ambulatory Visit: Payer: Self-pay | Admitting: Internal Medicine

## 2016-08-06 ENCOUNTER — Ambulatory Visit (INDEPENDENT_AMBULATORY_CARE_PROVIDER_SITE_OTHER): Payer: Medicare Other | Admitting: Internal Medicine

## 2016-08-06 VITALS — BP 132/94 | HR 51 | Temp 98.4°F | Resp 17 | Ht 63.0 in | Wt 132.8 lb

## 2016-08-06 DIAGNOSIS — E785 Hyperlipidemia, unspecified: Secondary | ICD-10-CM

## 2016-08-06 DIAGNOSIS — I10 Essential (primary) hypertension: Secondary | ICD-10-CM

## 2016-08-06 DIAGNOSIS — E039 Hypothyroidism, unspecified: Secondary | ICD-10-CM

## 2016-08-06 DIAGNOSIS — I6930 Unspecified sequelae of cerebral infarction: Secondary | ICD-10-CM

## 2016-08-06 DIAGNOSIS — F0630 Mood disorder due to known physiological condition, unspecified: Secondary | ICD-10-CM

## 2016-08-06 DIAGNOSIS — F1010 Alcohol abuse, uncomplicated: Secondary | ICD-10-CM

## 2016-08-06 DIAGNOSIS — G3184 Mild cognitive impairment, so stated: Secondary | ICD-10-CM

## 2016-08-06 DIAGNOSIS — F0150 Vascular dementia without behavioral disturbance: Secondary | ICD-10-CM

## 2016-08-06 DIAGNOSIS — I69398 Other sequelae of cerebral infarction: Secondary | ICD-10-CM

## 2016-08-06 LAB — COMPREHENSIVE METABOLIC PANEL
ALT: 12 U/L (ref 0–35)
AST: 19 U/L (ref 0–37)
Albumin: 4.3 g/dL (ref 3.5–5.2)
Alkaline Phosphatase: 73 U/L (ref 39–117)
BUN: 7 mg/dL (ref 6–23)
CALCIUM: 9.7 mg/dL (ref 8.4–10.5)
CHLORIDE: 109 meq/L (ref 96–112)
CO2: 25 meq/L (ref 19–32)
Creatinine, Ser: 0.93 mg/dL (ref 0.40–1.20)
GFR: 77.38 mL/min (ref >60.00)
GLUCOSE: 104 mg/dL — AB (ref 70–99)
POTASSIUM: 3.5 meq/L (ref 3.5–5.1)
Sodium: 140 mEq/L (ref 135–145)
Total Bilirubin: 0.7 mg/dL (ref 0.2–1.2)
Total Protein: 7.9 g/dL (ref 6.0–8.3)

## 2016-08-06 LAB — LIPID PANEL
Cholesterol: 263 mg/dL — ABNORMAL HIGH (ref 0–200)
HDL: 75.1 mg/dL (ref >39.00)
LDL CALC: 173 mg/dL — AB (ref 0–99)
NonHDL: 187.94
TRIGLYCERIDES: 75 mg/dL (ref 0.0–149.0)
Total CHOL/HDL Ratio: 4
VLDL: 15 mg/dL (ref 0.0–40.0)

## 2016-08-06 LAB — TSH

## 2016-08-06 MED ORDER — LOSARTAN POTASSIUM 100 MG PO TABS
100.0000 mg | ORAL_TABLET | Freq: Every day | ORAL | 1 refills | Status: DC
Start: 2016-08-06 — End: 2016-12-02

## 2016-08-06 MED ORDER — LIOTHYRONINE SODIUM 5 MCG PO TABS: 5.0000 ug | ORAL_TABLET | Freq: Every day | ORAL | 0 refills | Status: DC

## 2016-08-06 MED ORDER — LEVOTHYROXINE SODIUM 50 MCG PO TABS: 50.0000 ug | ORAL_TABLET | Freq: Every day | ORAL | 0 refills | Status: DC

## 2016-08-06 MED ORDER — SERTRALINE HCL 50 MG PO TABS: 50.0000 mg | ORAL_TABLET | Freq: Every day | ORAL | 0 refills | Status: DC

## 2016-08-06 NOTE — Telephone Encounter (Signed)
Losartan and sertraline ok to send 2 week supply

## 2016-08-06 NOTE — Telephone Encounter (Signed)
Pt's husband called back and stated that they needed a two week supply of the thyroid medications sent to [place] for his wife. The medications have been sent and the pt has been notified.

## 2016-08-06 NOTE — Telephone Encounter (Signed)
Is it ok if I do this?

## 2016-08-06 NOTE — Telephone Encounter (Signed)
Pt's husband called back and stated that they needed a two week supply of the thyroid medications sent to [place] for his wife. The medications have been sent and the pt has been notified.

## 2016-08-06 NOTE — Progress Notes (Signed)
Subjective:  Patient ID: Christine Hull, female    DOB: June 15, 1950  Age: 66 y.o. MRN: [id number]  CC: The primary encounter diagnosis was Acquired hypothyroidism. 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  HPI Christine Hull presents for  Follow up and discussion of recent MRI of brain, which was finally done to evaluate development of cognitive dysfunction noted by family and now acknowledged by patient.   The MRI was done July 6,  Patient is  accompanied by her husband and her daughter , both of whom are verey supportive and very concerned.   Additional issues:  She has  A history of  QT prolongation that was found during workup for near syncope Christine Hull) with  LOOP recorder which is still in place.  .  Her anti hypertensive medications were adjusted to remove HCTZ.  Her daughter has not been evaluated and has been advised today to have her gynecologist (she does not have a PCP) to make a cardiology referral for her.    Cognitive deficits:  Per  family, patient does not leave the house more than once a month.  She has been less socially interactive and avoids family outings.   She drinks coffee with alcohol in it (per patient)but states that she does not do this more than 2 times per week (prior conversations with husband suggest that this is a minimization).  She continues to drive her car independently and has not had any traffic violations or accidents.  The amily has not driven with her in several months.  Husband recalls that she got lost on the way to my office  A year ago,  Patient adamantly refuses.  Patient insists that sheis taking "ALL" of her medications correctly but is missing several medications for her bag and  cannot explain why.  She refuses to allow anyone to help her or supervise her medication administration    She has been having cold intolerance to the point that despite the 90 degree weather she has turned the heat on in the house.    Lab Results  Component Value Date   TSH 52.59 Repeated and verified X2. (H) 08/06/2016      Outpatient Medications Prior to Visit  Medication Sig Dispense Refill  . fish oil-omega-3 fatty acids 1000 MG capsule Take 1 g by mouth daily.     Marland Kitchen levothyroxine (SYNTHROID, LEVOTHROID) 75 MCG tablet Take 1 tablet (75 mcg total) by mouth daily before breakfast. 90 tablet 0  . nadolol (CORGARD) 20 MG tablet Take 0.5 tablets (10 mg total) by mouth 2 (two) times daily. 90 tablet 0  . liothyronine (CYTOMEL) 5 MCG tablet TAKE 1 TABLET DAILY 90 tablet 0  . losartan (COZAAR) 100 MG tablet Take 1 tablet (100 mg total) by mouth daily. 90 tablet 1   No facility-administered medications prior to visit.     Review of Systems;  Patient denies headache, fevers, malaise, unintentional weight loss, skin rash, eye pain, sinus congestion and sinus pain, sore throat, dysphagia,  hemoptysis , cough, dyspnea, wheezing, chest pain, palpitations, orthopnea, edema, abdominal pain, nausea, melena, diarrhea, constipation, flank pain, dysuria, hematuria, urinary  Frequency, nocturia, numbness, tingling, seizures,  Focal weakness, Loss of consciousness,  Tremor, insomnia, depression, anxiety, and suicidal ideation.      Objective:  BP (!) 132/94 (BP Location: Left Arm, Patient Position: Sitting, Cuff Size: Normal)   Pulse (!) 51   Temp 98.4 F (36.9 C) (Oral)   Resp 17   Ht 5\' 3"  (1.6 m)   Wt 132 lb 12.8 oz (60.2 kg)   SpO2 95%   BMI 23.52 kg/m   BP Readings from Last 3 Encounters:  08/06/16 (!) 132/94  06/11/16 140/78  04/27/16 122/90    Wt Readings from Last 3 Encounters:  08/06/16 132 lb 12.8 oz (60.2 kg)  04/27/16 146 lb 9.6 oz (66.5 kg)  04/10/16 146 lb (66.2 kg)     General appearance: alert, cooperative and appears stated age Ears: normal TM's and external ear canals both ears Throat: lips, mucosa, and tongue normal; teeth and gums normal Neck: no adenopathy, no carotid bruit, supple, symmetrical, trachea midline and thyroid not enlarged, symmetric, no tenderness/mass/nodules Back: symmetric, no curvature. ROM normal. No CVA tenderness. Lungs: clear to auscultation bilaterally Heart: regular rate and rhythm, S1, S2 normal, no murmur, click, rub or gallop Abdomen: soft, non-tender; bowel sounds normal; no masses,  no organomegaly Pulses: 2+ and symmetric Skin: Skin color, texture, turgor normal. No rashes or lesions Lymph nodes: Cervical, supraclavicular, and axillary nodes normal.  No results found for: HGBA1C  Lab Results  Component Value Date   CREATININE 0.93 08/06/2016   CREATININE 0.94 04/27/2016   CREATININE 1.05 07/31/2015    Lab Results  Component Value Date   WBC 4.6 04/27/2016   HGB 12.4 04/27/2016   HCT 37.1 04/27/2016   PLT 317.0 04/27/2016   GLUCOSE 104 (H) 08/06/2016   CHOL 263 (H) 08/06/2016   TRIG 75.0 08/06/2016   HDL 75.10 08/06/2016   LDLDIRECT 137.0 07/31/2015   LDLCALC 173 (H) 08/06/2016   ALT 12 08/06/2016   AST 19 08/06/2016   NA 140 08/06/2016   K 3.5 08/06/2016   CL 109 08/06/2016   CREATININE 0.93 08/06/2016   BUN 7 08/06/2016   CO2 25 08/06/2016   TSH 52.59 Repeated and verified X2. (H) 08/06/2016    Mr Brain Wo Contrast  Result Date: 07/17/2016 CLINICAL DATA:  Memory loss. History of hypertension, alcohol and tobacco abuse, hyperlipidemia. EXAM: MRI HEAD WITHOUT CONTRAST TECHNIQUE: Multiplanar, multiecho pulse sequences of the brain and surrounding structures were obtained without intravenous contrast. COMPARISON:  CT HEAD November 11, 2013 and MRI of the head February 02, 2013 FINDINGS: Scalp susceptibility artifact attributed to hair products. BRAIN: No reduced diffusion to suggest acute ischemia or  hypercellular tumor. No susceptibility artifact to suggest hemorrhage. Mild prominence of ventricles and sulci, similar to prior imaging. Bilateral basal ganglia lacunar infarcts, new from prior MRI. Mildly expansile bright FLAIR signal LEFT caudate head new from prior MRI. A few scattered subcentimeter supratentorial and pontine white matter FLAIR T2 hyperintensities have progressed. No midline shift or mass effect. No abnormal extra-axial fluid collections. VASCULAR: Normal major intracranial vascular flow voids present at skull base. SKULL AND UPPER CERVICAL SPINE: No abnormal sellar expansion. No suspicious calvarial bone marrow signal. Craniocervical junction maintained. SINUSES/ORBITS: Trace paranasal sinus mucosal thickening without air-fluid levels. Trace LEFT mastoid effusion. The included ocular globes and orbital contents are non-suspicious. OTHER: None. IMPRESSION: 1. No acute intracranial process. 2. New nonacute bilateral basal ganglia lacunar infarcts. Atypical, possibly subacute lacunar infarct LEFT caudate head. Recommend close attention on follow-up imaging or repeat examination in 3-6 months with contrast. 3. Mild chronic small vessel ischemic disease, progressed from prior examination. Similar mild global parenchymal brain  volume loss. Electronically Signed   By: Elon Alas M.D.   On: 07/17/2016 14:02    Assessment & Plan:   Problem List Items Addressed This Visit    Acquired hypothyroidism - Primary    She is severely hypothyroid due to medication noncompliance that is ongoing and complicated by patient's denial of the degree of cognitive dysfunction that she is having she refuses to allow family to assist her . 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    Lab Results  Component Value Date   TSH 52.59 Repeated and verified X2. (H) 08/06/2016         Relevant Orders   TSH (Completed)   Alcohol abuse    She is  minimizing her use of alcohol.  I have advised her strongly to stop all alcohol consumption given her diagnosis of dementia  Lab Results  Component Value Date   VITAMINB12 340 04/27/2016    RBC Folate is 338 (normal).      Essential hypertension    She should be taking losartan and nadolol.  No changes today since compliance is in question .      Relevant Orders   Comprehensive metabolic panel (Completed)   Hyperlipidemia LDL goal <100    Managed with  krill oil; her ten year risk of CAD events is 13%  .  Statin advised but deferred by patient.  Lab Results  Component Value Date   CHOL 263 (H) 08/06/2016   HDL 75.10 08/06/2016   LDLCALC 173 (H) 08/06/2016   LDLDIRECT 137.0 07/31/2015   TRIG 75.0 08/06/2016   CHOLHDL 4 08/06/2016   10 yr risk  Of CAD  is 15% using FRC will recommend statin therapy .      Relevant Orders   Lipid panel (Completed)   Late effect of lacunar infarction    Patient clearly has vascular dementia complicated by alcohol abuse and untreated hypothyroidism. It is unclear if she is taking both blood pressure medications since the losartan is missing from her bag today.  She should continue nadolol and losartan.      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  Trial of sertraline offered.       VAD (vascular dementia)    Suggested by recent MRI noting  Prior lacunar infarcts and chronic small vessel ischemic disease. Her family and I were unable to persuade her to relinquish control of her medication administration so severely underactive thyroid and alcohol abuse are complicating her cognitive decline.       Relevant Medications   sertraline (ZOLOFT) 50 MG tablet   Other Relevant Orders   Ambulatory referral to Neurology     A total of 40 minutes was spent with patient more than half of which was spent in counseling patient on the above mentioned  issues , reviewing and explaining recent labs and imaging studies done, and coordination of care.  I am having Ms. Derrick start on sertraline. I am also having her maintain her fish oil-omega-3 fatty acids, nadolol, and levothyroxine.  Meds ordered this encounter  Medications  . sertraline (ZOLOFT) 50 MG tablet    Sig: Take 1 tablet (50 mg total) by mouth daily.    Dispense:  90 tablet    Refill:  0    There are no discontinued medications.  Follow-up: Return in  about 4 weeks (around 09/03/2016).   Crecencio Mc, MD

## 2016-08-06 NOTE — Telephone Encounter (Signed)
Losartan has been refilled.  

## 2016-08-06 NOTE — Addendum Note (Signed)
Addended by: Adair Laundry on: 08/06/2016 10:44 AM   Modules accepted: Orders

## 2016-08-06 NOTE — Telephone Encounter (Signed)
Pt spouse called back and needs 2 weeks worth of the other 2 medications sent into [facility] until she can get the rx from [place]. Please advise, thank you!

## 2016-08-06 NOTE — Patient Instructions (Addendum)
Tell  Your gynocologist that you would like to have a referral to Dr Caryl Comes at [facility]  for a cardiac evaluation  because your mother has prlonged QT     I RECOMMEND THAT YOU ALLOW YOUR HUSBAND AND DAUGHTER MAKE SURE YOU ARE TAKING ALL OF YOUR MEDICATIONS EVERY DAY   You HAVE TO USE A PILL BOX  TO MAKE SURE YOU ARE USING YOUR MEDICATION   I  AM RECOMMENDING A TRIAL OF SERTRALINE AT DINNERTIME  FOR YOUR MOOD

## 2016-08-06 NOTE — Telephone Encounter (Signed)
Pt also would like a refill for losartan 100mg .   Just today need refill [place] Thank you!

## 2016-08-09 ENCOUNTER — Encounter: Payer: Self-pay | Admitting: Internal Medicine

## 2016-08-09 DIAGNOSIS — F0630 Mood disorder due to known physiological condition, unspecified: Secondary | ICD-10-CM | POA: Insufficient documentation

## 2016-08-09 DIAGNOSIS — I69398 Other sequelae of cerebral infarction: Secondary | ICD-10-CM

## 2016-08-09 NOTE — Assessment & Plan Note (Signed)
Patient clearly has vascular dementia complicated by alcohol abuse and untreated hypothyroidism. It is unclear if she is taking both blood pressure medications since the losartan is missing from her bag today.  She should continue nadolol and losartan.

## 2016-08-09 NOTE — Assessment & Plan Note (Signed)
She is severely hypothyroid due to medication noncompliance that is ongoing and complicated by patient's denial of the degree of cognitive dysfunction that she is having she refuses to allow family to assist her . 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    Lab Results  Component Value Date   TSH 52.59 Repeated and verified X2. (H) 08/06/2016

## 2016-08-09 NOTE — Assessment & Plan Note (Signed)
She should be taking losartan and nadolol.  No changes today since compliance is in question .

## 2016-08-09 NOTE — Assessment & Plan Note (Signed)
She has become argumentative with family and withdrawn from social interaction.  Trial of sertraline offered.

## 2016-08-09 NOTE — Assessment & Plan Note (Signed)
Managed with  krill oil; her ten year risk of CAD events is 13%  .  Statin advised but deferred by patient.  Lab Results  Component Value Date   CHOL 263 (H) 08/06/2016   HDL 75.10 08/06/2016   LDLCALC 173 (H) 08/06/2016   LDLDIRECT 137.0 07/31/2015   TRIG 75.0 08/06/2016   CHOLHDL 4 08/06/2016   10 yr risk  Of CAD  is 15% using FRC will recommend statin therapy .

## 2016-08-09 NOTE — Assessment & Plan Note (Addendum)
She is minimizing her use of alcohol.  I have advised her strongly to stop all alcohol consumption given her diagnosis of dementia  Lab Results  Component Value Date   VITAMINB12 340 04/27/2016    RBC Folate is 338 (normal).

## 2016-08-09 NOTE — Assessment & Plan Note (Signed)
Suggested by recent MRI noting  Prior lacunar infarcts and chronic small vessel ischemic disease. Her family and I were unable to persuade her to relinquish control of her medication administration so severely underactive thyroid and alcohol abuse are complicating her cognitive decline.

## 2016-08-18 ENCOUNTER — Encounter: Payer: Self-pay | Admitting: *Deleted

## 2016-08-26 DIAGNOSIS — G479 Sleep disorder, unspecified: Secondary | ICD-10-CM | POA: Diagnosis not present

## 2016-08-26 DIAGNOSIS — Z8673 Personal history of transient ischemic attack (TIA), and cerebral infarction without residual deficits: Secondary | ICD-10-CM | POA: Diagnosis not present

## 2016-08-26 DIAGNOSIS — R413 Other amnesia: Secondary | ICD-10-CM | POA: Diagnosis not present

## 2016-10-17 ENCOUNTER — Other Ambulatory Visit: Payer: Self-pay | Admitting: Internal Medicine

## 2016-10-25 ENCOUNTER — Other Ambulatory Visit: Payer: Self-pay | Admitting: Internal Medicine

## 2016-10-28 ENCOUNTER — Ambulatory Visit: Payer: Medicare Other | Admitting: Internal Medicine

## 2016-11-01 ENCOUNTER — Other Ambulatory Visit: Payer: Self-pay | Admitting: Internal Medicine

## 2016-11-06 ENCOUNTER — Ambulatory Visit: Payer: Medicare Other | Admitting: Internal Medicine

## 2016-11-16 ENCOUNTER — Other Ambulatory Visit: Payer: Self-pay | Admitting: Internal Medicine

## 2016-11-16 MED ORDER — SERTRALINE HCL 50 MG PO TABS: 50.0000 mg | ORAL_TABLET | Freq: Every day | ORAL | 0 refills | Status: DC

## 2016-11-16 NOTE — Telephone Encounter (Signed)
Copied from [place] [id number]. Topic: Inquiry >> Nov 16, 2016 12:06 PM Darl Householder, RMA wrote: Reason for CRM: patient called and stated that her medication Sertraline 50 mg has been sent to wrong pharmacy, it was sent to [facility] but it was suppose to go to [facility], please resend medication to correct pharmacy

## 2016-11-16 NOTE — Telephone Encounter (Signed)
Script sent to [facility] as requested.

## 2016-11-25 ENCOUNTER — Telehealth: Payer: Self-pay | Admitting: Internal Medicine

## 2016-11-25 NOTE — Telephone Encounter (Signed)
Spoke to pt. She has a lot going on right now with the Holidays coming up and would like me to call her back in 3-4 weeks to schedule.

## 2016-11-25 NOTE — Telephone Encounter (Signed)
Copied from [place] [id number]. Topic: Inquiry >> Nov 25, 2016 11:59 AM Oliver Pila B wrote: Reason for CRM: PT wants to know if they are still needing the Rx of losartan and if so can they have it refilled thru [facility], if not let them know of the replacement medication

## 2016-11-25 NOTE — Telephone Encounter (Signed)
Spoke with husband and advised her to call the pharmacy to see if they use the manufacturer that is being recalled.

## 2016-12-02 ENCOUNTER — Other Ambulatory Visit: Payer: Self-pay

## 2016-12-02 MED ORDER — LOSARTAN POTASSIUM 100 MG PO TABS: 100.0000 mg | ORAL_TABLET | Freq: Every day | ORAL | 0 refills | Status: DC

## 2016-12-15 ENCOUNTER — Other Ambulatory Visit: Payer: Self-pay

## 2016-12-15 MED ORDER — LOSARTAN POTASSIUM 100 MG PO TABS: 100.0000 mg | ORAL_TABLET | Freq: Every day | ORAL | 0 refills | Status: DC

## 2017-01-17 ENCOUNTER — Other Ambulatory Visit: Payer: Self-pay | Admitting: Internal Medicine

## 2017-01-20 NOTE — Telephone Encounter (Signed)
Left message for patient to call back and schedule Medicare Annual Wellness Visit (AWV).  No hx of AWV; please schedule anytime with Denisa O'Brien-Blaney at [facility]

## 2017-01-21 ENCOUNTER — Encounter: Payer: Self-pay | Admitting: Internal Medicine

## 2017-01-21 ENCOUNTER — Other Ambulatory Visit: Payer: Self-pay | Admitting: Internal Medicine

## 2017-01-21 MED ORDER — ZOSTER VAC RECOMB ADJUVANTED 50 MCG/0.5ML IM SUSR: 0.5000 mL | Freq: Once | INTRAMUSCULAR | 1 refills | Status: AC

## 2017-01-27 ENCOUNTER — Other Ambulatory Visit: Payer: Self-pay | Admitting: Internal Medicine

## 2017-02-19 ENCOUNTER — Other Ambulatory Visit: Payer: Self-pay | Admitting: Internal Medicine

## 2017-02-19 MED ORDER — LOSARTAN POTASSIUM 100 MG PO TABS: 100.0000 mg | ORAL_TABLET | Freq: Every day | ORAL | 0 refills | Status: DC

## 2017-02-19 MED ORDER — NICOTINE 14 MG/24HR TD PT24: 14.0000 mg | MEDICATED_PATCH | Freq: Every day | TRANSDERMAL | 0 refills | Status: DC

## 2017-02-19 MED ORDER — LIOTHYRONINE SODIUM 5 MCG PO TABS: 5.0000 ug | ORAL_TABLET | Freq: Every day | ORAL | 0 refills | Status: DC

## 2017-02-19 NOTE — Progress Notes (Unsigned)
Subjective:  Patient ID: Christine Hull, female    DOB: 24-Mar-1950  Age: 67 y.o. MRN: [id number]  CC: There were no encounter diagnoses.  HPI [name] presents for ***  Outpatient Medications Prior to Visit  Medication Sig Dispense Refill  . fish oil-omega-3 fatty acids 1000 MG capsule Take 1 g by mouth daily.     Marland Kitchen levothyroxine (LEVOXYL) 50 MCG tablet Take 1 tablet (50 mcg total) by mouth daily before breakfast. PLEASE CONTACT OFFICE FOR A FOLLOW-UP FOR FURTHER REFILLS 90 tablet 0  . nadolol (CORGARD) 20 MG tablet TAKE 1 TABLET TWICE A DAY 180 tablet 1  . sertraline (ZOLOFT) 50 MG tablet TAKE 1 TABLET DAILY 90 tablet 1  . liothyronine (CYTOMEL) 5 MCG tablet Take 1 tablet (5 mcg total) by mouth daily. PLEASE CONTACT OFFICE FOR A FOLLOW-UP FOR FURTHER REFILLS 90 tablet 0  . losartan (COZAAR) 100 MG tablet Take 1 tablet (100 mg total) by mouth daily. 90 tablet 0   No facility-administered medications prior to visit.     Review of Systems;  Patient denies headache, fevers, malaise, unintentional weight loss, skin rash, eye pain, sinus congestion and sinus pain, sore throat, dysphagia,  hemoptysis , cough, dyspnea, wheezing, chest pain, palpitations, orthopnea, edema, abdominal pain, nausea, melena, diarrhea, constipation, flank pain, dysuria, hematuria, urinary  Frequency, nocturia, numbness, tingling, seizures,  Focal weakness, Loss of consciousness,  Tremor, insomnia, depression, anxiety, and suicidal ideation.      Objective:  There were no vitals taken for this visit.  BP Readings from Last 3 Encounters:  08/06/16 (!) 132/94  06/11/16 140/78  04/27/16 122/90    Wt Readings from Last 3 Encounters:  08/06/16 132 lb 12.8 oz (60.2 kg)  04/27/16 146 lb 9.6 oz (66.5 kg)  04/10/16 146 lb (66.2 kg)    General appearance: alert, cooperative and appears stated age Ears: normal TM's and external ear canals both ears Throat: lips, mucosa, and tongue normal; teeth and  gums normal Neck: no adenopathy, no carotid bruit, supple, symmetrical, trachea midline and thyroid not enlarged, symmetric, no tenderness/mass/nodules Back: symmetric, no curvature. ROM normal. No CVA tenderness. Lungs: clear to auscultation bilaterally Heart: regular rate and rhythm, S1, S2 normal, no murmur, click, rub or gallop Abdomen: soft, non-tender; bowel sounds normal; no masses,  no organomegaly Pulses: 2+ and symmetric Skin: Skin color, texture, turgor normal. No rashes or lesions Lymph nodes: Cervical, supraclavicular, and axillary nodes normal.  No results found for: HGBA1C  Lab Results  Component Value Date   CREATININE 0.93 08/06/2016   CREATININE 0.94 04/27/2016   CREATININE 1.05 07/31/2015    Lab Results  Component Value Date   WBC 4.6 04/27/2016   HGB 12.4 04/27/2016   HCT 37.1 04/27/2016   PLT 317.0 04/27/2016   GLUCOSE 104 (H) 08/06/2016   CHOL 263 (H) 08/06/2016   TRIG 75.0 08/06/2016   HDL 75.10 08/06/2016   LDLDIRECT 137.0 07/31/2015   LDLCALC 173 (H) 08/06/2016   ALT 12 08/06/2016   AST 19 08/06/2016   NA 140 08/06/2016   K 3.5 08/06/2016   CL 109 08/06/2016   CREATININE 0.93 08/06/2016   BUN 7 08/06/2016   CO2 25 08/06/2016   TSH 52.59 Repeated and verified X2. (H) 08/06/2016    Mr Brain Wo Contrast  Result Date: 07/17/2016 CLINICAL DATA:  Memory loss. History of hypertension, alcohol and tobacco abuse, hyperlipidemia. EXAM: MRI HEAD WITHOUT CONTRAST TECHNIQUE: Multiplanar, multiecho pulse sequences of the brain and surrounding  structures were obtained without intravenous contrast. COMPARISON:  CT HEAD November 11, 2013 and MRI of the head February 02, 2013 FINDINGS: Scalp susceptibility artifact attributed to hair products. BRAIN: No reduced diffusion to suggest acute ischemia or hypercellular tumor. No susceptibility artifact to suggest hemorrhage. Mild prominence of ventricles and sulci, similar to prior imaging. Bilateral basal ganglia lacunar  infarcts, new from prior MRI. Mildly expansile bright FLAIR signal LEFT caudate head new from prior MRI. A few scattered subcentimeter supratentorial and pontine white matter FLAIR T2 hyperintensities have progressed. No midline shift or mass effect. No abnormal extra-axial fluid collections. VASCULAR: Normal major intracranial vascular flow voids present at skull base. SKULL AND UPPER CERVICAL SPINE: No abnormal sellar expansion. No suspicious calvarial bone marrow signal. Craniocervical junction maintained. SINUSES/ORBITS: Trace paranasal sinus mucosal thickening without air-fluid levels. Trace LEFT mastoid effusion. The included ocular globes and orbital contents are non-suspicious. OTHER: None. IMPRESSION: 1. No acute intracranial process. 2. New nonacute bilateral basal ganglia lacunar infarcts. Atypical, possibly subacute lacunar infarct LEFT caudate head. Recommend close attention on follow-up imaging or repeat examination in 3-6 months with contrast. 3. Mild chronic small vessel ischemic disease, progressed from prior examination. Similar mild global parenchymal brain volume loss. Electronically Signed   By: Elon Alas M.D.   On: 07/17/2016 14:02    Assessment & Plan:   Problem List Items Addressed This Visit    None      I have changed Floria Raveling. Blaydes's liothyronine. I am also having her maintain her fish oil-omega-3 fatty acids, nadolol, levothyroxine, sertraline, and losartan.  Meds ordered this encounter  Medications  . losartan (COZAAR) 100 MG tablet    Sig: Take 1 tablet (100 mg total) by mouth daily.    Dispense:  30 tablet    Refill:  0    Dose increase,  . liothyronine (CYTOMEL) 5 MCG tablet    Sig: Take 1 tablet (5 mcg total) by mouth daily.    Dispense:  30 tablet    Refill:  0    Medications Discontinued During This Encounter  Medication Reason  . losartan (COZAAR) 100 MG tablet Reorder  . liothyronine (CYTOMEL) 5 MCG tablet Reorder    Follow-up: No  Follow-up on file.   Crecencio Mc, MD

## 2017-02-22 ENCOUNTER — Other Ambulatory Visit: Payer: Self-pay | Admitting: Internal Medicine

## 2017-02-22 NOTE — Progress Notes (Unsigned)
Letter printed for husband

## 2017-02-26 ENCOUNTER — Other Ambulatory Visit: Payer: Self-pay | Admitting: Internal Medicine

## 2017-04-11 ENCOUNTER — Other Ambulatory Visit: Payer: Self-pay | Admitting: Internal Medicine

## 2017-04-23 ENCOUNTER — Other Ambulatory Visit: Payer: Self-pay | Admitting: Internal Medicine

## 2017-04-23 NOTE — Telephone Encounter (Signed)
Patient last OV and labs 7/18 last note on sig reads please schedule follow up for further refills no appointment  Scheduled.

## 2017-07-18 ENCOUNTER — Other Ambulatory Visit: Payer: Self-pay | Admitting: Internal Medicine

## 2017-07-19 NOTE — Telephone Encounter (Signed)
Refilled: 04/23/2017 Last OV: 08/06/2016 Next OV: not scheduled Last TSH: 08/06/2016

## 2017-07-19 NOTE — Telephone Encounter (Signed)
THIS IS THE LAST REFILL I WILL GIVE HER WITHOUT AN OFFICE VISIT

## 2017-07-22 ENCOUNTER — Other Ambulatory Visit: Payer: Self-pay | Admitting: Internal Medicine

## 2017-07-26 ENCOUNTER — Other Ambulatory Visit: Payer: Self-pay | Admitting: Internal Medicine

## 2017-07-29 NOTE — Telephone Encounter (Signed)
Patients husband states he will call back to schedule

## 2017-08-12 ENCOUNTER — Other Ambulatory Visit: Payer: Self-pay

## 2017-12-23 ENCOUNTER — Other Ambulatory Visit: Payer: Self-pay | Admitting: Internal Medicine

## 2017-12-23 DIAGNOSIS — F0281 Dementia in other diseases classified elsewhere with behavioral disturbance: Secondary | ICD-10-CM

## 2017-12-23 DIAGNOSIS — F02818 Dementia in other diseases classified elsewhere, unspecified severity, with other behavioral disturbance: Secondary | ICD-10-CM

## 2018-01-07 ENCOUNTER — Telehealth: Payer: Self-pay

## 2018-01-07 NOTE — Telephone Encounter (Signed)
Copied from [place] [id number]. Topic: Referral - Request for Referral >> Jan 07, 2018  1:03 PM Christine Hull wrote: Has patient seen PCP for this complaint? yes *If NO, is insurance requiring patient see PCP for this issue before PCP can refer them? Referral for which specialty: home health care Preferred provider/office: [facility], [phone] (p), [phone] (f) Reason for referral: let orders for prior home health lapse and wants to be seen by these folks.  Pt's spouse states they need orders and insurance information.  Christine Hull, can be reached at [phone]

## 2018-01-10 DIAGNOSIS — I10 Essential (primary) hypertension: Secondary | ICD-10-CM | POA: Diagnosis not present

## 2018-01-10 DIAGNOSIS — E039 Hypothyroidism, unspecified: Secondary | ICD-10-CM | POA: Diagnosis not present

## 2018-01-10 DIAGNOSIS — Z0000 Encounter for general adult medical examination without abnormal findings: Secondary | ICD-10-CM | POA: Diagnosis not present

## 2018-01-10 DIAGNOSIS — F329 Major depressive disorder, single episode, unspecified: Secondary | ICD-10-CM | POA: Diagnosis not present

## 2018-01-10 DIAGNOSIS — F0390 Unspecified dementia without behavioral disturbance: Secondary | ICD-10-CM | POA: Diagnosis not present

## 2018-01-10 NOTE — Telephone Encounter (Signed)
PLEASE ASK HOME HEALTH TO   DRAW CMET,  TSH, B12 AND CBC   VITAL SIGNS

## 2018-01-24 NOTE — Telephone Encounter (Signed)
Spoke with pt's husband and he stated that the pt is not getting home health care because the pt has not been seen by her PCP in 90 days or less so they were referred to house call doctor company and they have a doctor coming out to the home to see the pt. Asked the husband if he knew if the house call doctors were taking over care of the pt or not and if not how do we get the lab orders to the doctor. The husband didn't know and he had me to call the pt's daughter who got all this set up for the pt. Asked her the same questions and she was not sure either but stated that she would reach out to the company tomorrow and find out some more information and give [place] a call back.

## 2018-01-25 NOTE — Telephone Encounter (Signed)
The new doctor should take over completely.Marland Kitchen  He/she will need an accurate history of patient so I would recommend that Christine Hull request Christine Hull records to be available to he new doctor at the time he sees Christine Hull (or before, if the doctor wants them ahead of time).  I have  printed  off the last office visit note,  have Christine Hull come pick it up,  Do not mail to house because Christine Hull will probably destroy it.

## 2018-02-04 DIAGNOSIS — F341 Dysthymic disorder: Secondary | ICD-10-CM | POA: Diagnosis not present

## 2018-02-04 DIAGNOSIS — Z5181 Encounter for therapeutic drug level monitoring: Secondary | ICD-10-CM | POA: Diagnosis not present

## 2018-02-04 DIAGNOSIS — E039 Hypothyroidism, unspecified: Secondary | ICD-10-CM | POA: Diagnosis not present

## 2018-02-04 DIAGNOSIS — I10 Essential (primary) hypertension: Secondary | ICD-10-CM | POA: Diagnosis not present

## 2018-02-04 DIAGNOSIS — F0390 Unspecified dementia without behavioral disturbance: Secondary | ICD-10-CM | POA: Diagnosis not present

## 2018-02-09 DIAGNOSIS — I10 Essential (primary) hypertension: Secondary | ICD-10-CM | POA: Diagnosis not present

## 2018-02-09 DIAGNOSIS — I69318 Other symptoms and signs involving cognitive functions following cerebral infarction: Secondary | ICD-10-CM | POA: Diagnosis not present

## 2018-02-09 DIAGNOSIS — M549 Dorsalgia, unspecified: Secondary | ICD-10-CM | POA: Diagnosis not present

## 2018-02-09 DIAGNOSIS — F0151 Vascular dementia with behavioral disturbance: Secondary | ICD-10-CM | POA: Diagnosis not present

## 2018-03-08 DIAGNOSIS — I10 Essential (primary) hypertension: Secondary | ICD-10-CM | POA: Diagnosis not present

## 2018-04-08 DIAGNOSIS — I10 Essential (primary) hypertension: Secondary | ICD-10-CM | POA: Diagnosis not present

## 2018-04-08 DIAGNOSIS — F341 Dysthymic disorder: Secondary | ICD-10-CM | POA: Diagnosis not present

## 2018-04-08 DIAGNOSIS — E782 Mixed hyperlipidemia: Secondary | ICD-10-CM | POA: Diagnosis not present

## 2018-04-08 DIAGNOSIS — E039 Hypothyroidism, unspecified: Secondary | ICD-10-CM | POA: Diagnosis not present

## 2018-04-08 DIAGNOSIS — F0151 Vascular dementia with behavioral disturbance: Secondary | ICD-10-CM | POA: Diagnosis not present

## 2018-04-14 DIAGNOSIS — F419 Anxiety disorder, unspecified: Secondary | ICD-10-CM | POA: Diagnosis not present

## 2018-04-14 DIAGNOSIS — G8929 Other chronic pain: Secondary | ICD-10-CM | POA: Diagnosis not present

## 2018-04-14 DIAGNOSIS — I69318 Other symptoms and signs involving cognitive functions following cerebral infarction: Secondary | ICD-10-CM | POA: Diagnosis not present

## 2018-04-14 DIAGNOSIS — F329 Major depressive disorder, single episode, unspecified: Secondary | ICD-10-CM | POA: Diagnosis not present

## 2018-04-14 DIAGNOSIS — F0151 Vascular dementia with behavioral disturbance: Secondary | ICD-10-CM | POA: Diagnosis not present

## 2018-04-14 DIAGNOSIS — F39 Unspecified mood [affective] disorder: Secondary | ICD-10-CM | POA: Diagnosis not present

## 2018-04-14 DIAGNOSIS — E039 Hypothyroidism, unspecified: Secondary | ICD-10-CM | POA: Diagnosis not present

## 2018-04-14 DIAGNOSIS — Z9181 History of falling: Secondary | ICD-10-CM | POA: Diagnosis not present

## 2018-04-14 DIAGNOSIS — Z87891 Personal history of nicotine dependence: Secondary | ICD-10-CM | POA: Diagnosis not present

## 2018-04-14 DIAGNOSIS — I10 Essential (primary) hypertension: Secondary | ICD-10-CM | POA: Diagnosis not present

## 2018-04-14 DIAGNOSIS — E782 Mixed hyperlipidemia: Secondary | ICD-10-CM | POA: Diagnosis not present

## 2018-04-14 DIAGNOSIS — M549 Dorsalgia, unspecified: Secondary | ICD-10-CM | POA: Diagnosis not present

## 2018-04-20 DIAGNOSIS — G8929 Other chronic pain: Secondary | ICD-10-CM | POA: Diagnosis not present

## 2018-04-20 DIAGNOSIS — I69318 Other symptoms and signs involving cognitive functions following cerebral infarction: Secondary | ICD-10-CM | POA: Diagnosis not present

## 2018-04-20 DIAGNOSIS — I10 Essential (primary) hypertension: Secondary | ICD-10-CM | POA: Diagnosis not present

## 2018-04-20 DIAGNOSIS — M549 Dorsalgia, unspecified: Secondary | ICD-10-CM | POA: Diagnosis not present

## 2018-04-20 DIAGNOSIS — F0151 Vascular dementia with behavioral disturbance: Secondary | ICD-10-CM | POA: Diagnosis not present

## 2018-04-20 DIAGNOSIS — F329 Major depressive disorder, single episode, unspecified: Secondary | ICD-10-CM | POA: Diagnosis not present

## 2018-04-22 DIAGNOSIS — G8929 Other chronic pain: Secondary | ICD-10-CM | POA: Diagnosis not present

## 2018-04-22 DIAGNOSIS — I10 Essential (primary) hypertension: Secondary | ICD-10-CM | POA: Diagnosis not present

## 2018-04-22 DIAGNOSIS — F329 Major depressive disorder, single episode, unspecified: Secondary | ICD-10-CM | POA: Diagnosis not present

## 2018-04-22 DIAGNOSIS — M549 Dorsalgia, unspecified: Secondary | ICD-10-CM | POA: Diagnosis not present

## 2018-04-22 DIAGNOSIS — I69318 Other symptoms and signs involving cognitive functions following cerebral infarction: Secondary | ICD-10-CM | POA: Diagnosis not present

## 2018-04-22 DIAGNOSIS — F0151 Vascular dementia with behavioral disturbance: Secondary | ICD-10-CM | POA: Diagnosis not present

## 2018-04-27 DIAGNOSIS — M549 Dorsalgia, unspecified: Secondary | ICD-10-CM | POA: Diagnosis not present

## 2018-04-27 DIAGNOSIS — I69318 Other symptoms and signs involving cognitive functions following cerebral infarction: Secondary | ICD-10-CM | POA: Diagnosis not present

## 2018-04-27 DIAGNOSIS — I10 Essential (primary) hypertension: Secondary | ICD-10-CM | POA: Diagnosis not present

## 2018-04-27 DIAGNOSIS — F0151 Vascular dementia with behavioral disturbance: Secondary | ICD-10-CM | POA: Diagnosis not present

## 2018-04-27 DIAGNOSIS — G8929 Other chronic pain: Secondary | ICD-10-CM | POA: Diagnosis not present

## 2018-04-27 DIAGNOSIS — F329 Major depressive disorder, single episode, unspecified: Secondary | ICD-10-CM | POA: Diagnosis not present

## 2018-04-29 DIAGNOSIS — F329 Major depressive disorder, single episode, unspecified: Secondary | ICD-10-CM | POA: Diagnosis not present

## 2018-04-29 DIAGNOSIS — I69318 Other symptoms and signs involving cognitive functions following cerebral infarction: Secondary | ICD-10-CM | POA: Diagnosis not present

## 2018-04-29 DIAGNOSIS — G8929 Other chronic pain: Secondary | ICD-10-CM | POA: Diagnosis not present

## 2018-04-29 DIAGNOSIS — I10 Essential (primary) hypertension: Secondary | ICD-10-CM | POA: Diagnosis not present

## 2018-04-29 DIAGNOSIS — F0151 Vascular dementia with behavioral disturbance: Secondary | ICD-10-CM | POA: Diagnosis not present

## 2018-04-29 DIAGNOSIS — M549 Dorsalgia, unspecified: Secondary | ICD-10-CM | POA: Diagnosis not present

## 2018-05-02 DIAGNOSIS — M549 Dorsalgia, unspecified: Secondary | ICD-10-CM | POA: Diagnosis not present

## 2018-05-02 DIAGNOSIS — F0151 Vascular dementia with behavioral disturbance: Secondary | ICD-10-CM | POA: Diagnosis not present

## 2018-05-02 DIAGNOSIS — I10 Essential (primary) hypertension: Secondary | ICD-10-CM | POA: Diagnosis not present

## 2018-05-02 DIAGNOSIS — F329 Major depressive disorder, single episode, unspecified: Secondary | ICD-10-CM | POA: Diagnosis not present

## 2018-05-02 DIAGNOSIS — I69318 Other symptoms and signs involving cognitive functions following cerebral infarction: Secondary | ICD-10-CM | POA: Diagnosis not present

## 2018-05-02 DIAGNOSIS — G8929 Other chronic pain: Secondary | ICD-10-CM | POA: Diagnosis not present

## 2018-05-05 DIAGNOSIS — F329 Major depressive disorder, single episode, unspecified: Secondary | ICD-10-CM | POA: Diagnosis not present

## 2018-05-05 DIAGNOSIS — I10 Essential (primary) hypertension: Secondary | ICD-10-CM | POA: Diagnosis not present

## 2018-05-05 DIAGNOSIS — G8929 Other chronic pain: Secondary | ICD-10-CM | POA: Diagnosis not present

## 2018-05-05 DIAGNOSIS — I69318 Other symptoms and signs involving cognitive functions following cerebral infarction: Secondary | ICD-10-CM | POA: Diagnosis not present

## 2018-05-05 DIAGNOSIS — F0151 Vascular dementia with behavioral disturbance: Secondary | ICD-10-CM | POA: Diagnosis not present

## 2018-05-05 DIAGNOSIS — M549 Dorsalgia, unspecified: Secondary | ICD-10-CM | POA: Diagnosis not present

## 2018-05-07 DIAGNOSIS — I10 Essential (primary) hypertension: Secondary | ICD-10-CM | POA: Diagnosis not present

## 2018-05-09 DIAGNOSIS — I10 Essential (primary) hypertension: Secondary | ICD-10-CM | POA: Diagnosis not present

## 2018-05-09 DIAGNOSIS — G8929 Other chronic pain: Secondary | ICD-10-CM | POA: Diagnosis not present

## 2018-05-09 DIAGNOSIS — F329 Major depressive disorder, single episode, unspecified: Secondary | ICD-10-CM | POA: Diagnosis not present

## 2018-05-09 DIAGNOSIS — I69318 Other symptoms and signs involving cognitive functions following cerebral infarction: Secondary | ICD-10-CM | POA: Diagnosis not present

## 2018-05-09 DIAGNOSIS — M549 Dorsalgia, unspecified: Secondary | ICD-10-CM | POA: Diagnosis not present

## 2018-05-09 DIAGNOSIS — F0151 Vascular dementia with behavioral disturbance: Secondary | ICD-10-CM | POA: Diagnosis not present

## 2018-05-12 DIAGNOSIS — I69318 Other symptoms and signs involving cognitive functions following cerebral infarction: Secondary | ICD-10-CM | POA: Diagnosis not present

## 2018-05-12 DIAGNOSIS — G8929 Other chronic pain: Secondary | ICD-10-CM | POA: Diagnosis not present

## 2018-05-12 DIAGNOSIS — F0151 Vascular dementia with behavioral disturbance: Secondary | ICD-10-CM | POA: Diagnosis not present

## 2018-05-12 DIAGNOSIS — F329 Major depressive disorder, single episode, unspecified: Secondary | ICD-10-CM | POA: Diagnosis not present

## 2018-05-12 DIAGNOSIS — Z8659 Personal history of other mental and behavioral disorders: Secondary | ICD-10-CM | POA: Diagnosis not present

## 2018-05-12 DIAGNOSIS — F0391 Unspecified dementia with behavioral disturbance: Secondary | ICD-10-CM | POA: Diagnosis not present

## 2018-05-12 DIAGNOSIS — M549 Dorsalgia, unspecified: Secondary | ICD-10-CM | POA: Diagnosis not present

## 2018-05-12 DIAGNOSIS — Z7189 Other specified counseling: Secondary | ICD-10-CM | POA: Diagnosis not present

## 2018-05-12 DIAGNOSIS — I10 Essential (primary) hypertension: Secondary | ICD-10-CM | POA: Diagnosis not present

## 2018-05-12 DIAGNOSIS — I6930 Unspecified sequelae of cerebral infarction: Secondary | ICD-10-CM | POA: Diagnosis not present

## 2018-05-13 DIAGNOSIS — G8929 Other chronic pain: Secondary | ICD-10-CM | POA: Diagnosis not present

## 2018-05-13 DIAGNOSIS — F329 Major depressive disorder, single episode, unspecified: Secondary | ICD-10-CM | POA: Diagnosis not present

## 2018-05-13 DIAGNOSIS — I10 Essential (primary) hypertension: Secondary | ICD-10-CM | POA: Diagnosis not present

## 2018-05-13 DIAGNOSIS — I69318 Other symptoms and signs involving cognitive functions following cerebral infarction: Secondary | ICD-10-CM | POA: Diagnosis not present

## 2018-05-13 DIAGNOSIS — M549 Dorsalgia, unspecified: Secondary | ICD-10-CM | POA: Diagnosis not present

## 2018-05-13 DIAGNOSIS — F0151 Vascular dementia with behavioral disturbance: Secondary | ICD-10-CM | POA: Diagnosis not present

## 2018-05-14 DIAGNOSIS — F0151 Vascular dementia with behavioral disturbance: Secondary | ICD-10-CM | POA: Diagnosis not present

## 2018-05-14 DIAGNOSIS — I10 Essential (primary) hypertension: Secondary | ICD-10-CM | POA: Diagnosis not present

## 2018-05-14 DIAGNOSIS — Z87891 Personal history of nicotine dependence: Secondary | ICD-10-CM | POA: Diagnosis not present

## 2018-05-14 DIAGNOSIS — E039 Hypothyroidism, unspecified: Secondary | ICD-10-CM | POA: Diagnosis not present

## 2018-05-14 DIAGNOSIS — E782 Mixed hyperlipidemia: Secondary | ICD-10-CM | POA: Diagnosis not present

## 2018-05-14 DIAGNOSIS — I69318 Other symptoms and signs involving cognitive functions following cerebral infarction: Secondary | ICD-10-CM | POA: Diagnosis not present

## 2018-05-14 DIAGNOSIS — F39 Unspecified mood [affective] disorder: Secondary | ICD-10-CM | POA: Diagnosis not present

## 2018-05-14 DIAGNOSIS — M549 Dorsalgia, unspecified: Secondary | ICD-10-CM | POA: Diagnosis not present

## 2018-05-14 DIAGNOSIS — Z9181 History of falling: Secondary | ICD-10-CM | POA: Diagnosis not present

## 2018-05-14 DIAGNOSIS — G8929 Other chronic pain: Secondary | ICD-10-CM | POA: Diagnosis not present

## 2018-05-14 DIAGNOSIS — F419 Anxiety disorder, unspecified: Secondary | ICD-10-CM | POA: Diagnosis not present

## 2018-05-14 DIAGNOSIS — F329 Major depressive disorder, single episode, unspecified: Secondary | ICD-10-CM | POA: Diagnosis not present

## 2018-05-20 DIAGNOSIS — F0151 Vascular dementia with behavioral disturbance: Secondary | ICD-10-CM | POA: Diagnosis not present

## 2018-05-20 DIAGNOSIS — M549 Dorsalgia, unspecified: Secondary | ICD-10-CM | POA: Diagnosis not present

## 2018-05-20 DIAGNOSIS — G8929 Other chronic pain: Secondary | ICD-10-CM | POA: Diagnosis not present

## 2018-05-20 DIAGNOSIS — I69318 Other symptoms and signs involving cognitive functions following cerebral infarction: Secondary | ICD-10-CM | POA: Diagnosis not present

## 2018-05-20 DIAGNOSIS — F329 Major depressive disorder, single episode, unspecified: Secondary | ICD-10-CM | POA: Diagnosis not present

## 2018-05-20 DIAGNOSIS — I10 Essential (primary) hypertension: Secondary | ICD-10-CM | POA: Diagnosis not present

## 2018-06-03 DIAGNOSIS — I69318 Other symptoms and signs involving cognitive functions following cerebral infarction: Secondary | ICD-10-CM | POA: Diagnosis not present

## 2018-06-03 DIAGNOSIS — F0151 Vascular dementia with behavioral disturbance: Secondary | ICD-10-CM | POA: Diagnosis not present

## 2018-06-03 DIAGNOSIS — G8929 Other chronic pain: Secondary | ICD-10-CM | POA: Diagnosis not present

## 2018-06-03 DIAGNOSIS — M549 Dorsalgia, unspecified: Secondary | ICD-10-CM | POA: Diagnosis not present

## 2018-06-03 DIAGNOSIS — F329 Major depressive disorder, single episode, unspecified: Secondary | ICD-10-CM | POA: Diagnosis not present

## 2018-06-03 DIAGNOSIS — I10 Essential (primary) hypertension: Secondary | ICD-10-CM | POA: Diagnosis not present

## 2018-07-05 DIAGNOSIS — F0390 Unspecified dementia without behavioral disturbance: Secondary | ICD-10-CM | POA: Diagnosis not present

## 2018-07-05 DIAGNOSIS — I10 Essential (primary) hypertension: Secondary | ICD-10-CM | POA: Diagnosis not present

## 2018-07-05 DIAGNOSIS — E039 Hypothyroidism, unspecified: Secondary | ICD-10-CM | POA: Diagnosis not present

## 2018-07-05 DIAGNOSIS — F341 Dysthymic disorder: Secondary | ICD-10-CM | POA: Diagnosis not present

## 2018-07-25 ENCOUNTER — Other Ambulatory Visit: Payer: Self-pay

## 2018-08-16 ENCOUNTER — Other Ambulatory Visit: Payer: Self-pay | Admitting: Internal Medicine

## 2018-08-17 NOTE — Telephone Encounter (Signed)
Pt has not been seen since 2018.

## 2018-09-05 DIAGNOSIS — I10 Essential (primary) hypertension: Secondary | ICD-10-CM | POA: Diagnosis not present

## 2018-09-05 DIAGNOSIS — E039 Hypothyroidism, unspecified: Secondary | ICD-10-CM | POA: Diagnosis not present

## 2018-09-05 DIAGNOSIS — F0390 Unspecified dementia without behavioral disturbance: Secondary | ICD-10-CM | POA: Diagnosis not present

## 2018-10-18 DIAGNOSIS — E039 Hypothyroidism, unspecified: Secondary | ICD-10-CM | POA: Diagnosis not present

## 2018-10-18 DIAGNOSIS — F0390 Unspecified dementia without behavioral disturbance: Secondary | ICD-10-CM | POA: Diagnosis not present

## 2018-10-18 DIAGNOSIS — Z23 Encounter for immunization: Secondary | ICD-10-CM | POA: Diagnosis not present

## 2018-10-18 DIAGNOSIS — I10 Essential (primary) hypertension: Secondary | ICD-10-CM | POA: Diagnosis not present

## 2018-12-13 ENCOUNTER — Other Ambulatory Visit: Payer: Self-pay | Admitting: Internal Medicine

## 2018-12-14 NOTE — Telephone Encounter (Signed)
Refilled: 07/19/2017 Last OV: 04/27/2016 Next OV: not scheduled

## 2019-02-25 DIAGNOSIS — I6930 Unspecified sequelae of cerebral infarction: Secondary | ICD-10-CM | POA: Diagnosis not present

## 2019-02-25 DIAGNOSIS — F0390 Unspecified dementia without behavioral disturbance: Secondary | ICD-10-CM | POA: Diagnosis not present

## 2019-02-25 DIAGNOSIS — I10 Essential (primary) hypertension: Secondary | ICD-10-CM | POA: Diagnosis not present

## 2019-02-25 DIAGNOSIS — F39 Unspecified mood [affective] disorder: Secondary | ICD-10-CM | POA: Diagnosis not present

## 2019-02-25 DIAGNOSIS — Z0000 Encounter for general adult medical examination without abnormal findings: Secondary | ICD-10-CM | POA: Diagnosis not present

## 2019-06-03 DIAGNOSIS — F39 Unspecified mood [affective] disorder: Secondary | ICD-10-CM | POA: Diagnosis not present

## 2019-06-03 DIAGNOSIS — E782 Mixed hyperlipidemia: Secondary | ICD-10-CM | POA: Diagnosis not present

## 2019-06-03 DIAGNOSIS — E039 Hypothyroidism, unspecified: Secondary | ICD-10-CM | POA: Diagnosis not present

## 2019-06-03 DIAGNOSIS — I10 Essential (primary) hypertension: Secondary | ICD-10-CM | POA: Diagnosis not present

## 2019-08-04 DIAGNOSIS — Z23 Encounter for immunization: Secondary | ICD-10-CM | POA: Diagnosis not present

## 2019-08-22 ENCOUNTER — Other Ambulatory Visit: Payer: Self-pay | Admitting: Internal Medicine

## 2019-08-26 DIAGNOSIS — F0151 Vascular dementia with behavioral disturbance: Secondary | ICD-10-CM | POA: Diagnosis not present

## 2019-08-26 DIAGNOSIS — F341 Dysthymic disorder: Secondary | ICD-10-CM | POA: Diagnosis not present

## 2019-08-26 DIAGNOSIS — I10 Essential (primary) hypertension: Secondary | ICD-10-CM | POA: Diagnosis not present

## 2019-09-01 DIAGNOSIS — Z23 Encounter for immunization: Secondary | ICD-10-CM | POA: Diagnosis not present

## 2019-11-25 DIAGNOSIS — I10 Essential (primary) hypertension: Secondary | ICD-10-CM | POA: Diagnosis not present

## 2019-11-25 DIAGNOSIS — F0151 Vascular dementia with behavioral disturbance: Secondary | ICD-10-CM | POA: Diagnosis not present

## 2019-11-25 DIAGNOSIS — F39 Unspecified mood [affective] disorder: Secondary | ICD-10-CM | POA: Diagnosis not present

## 2021-02-16 ENCOUNTER — Other Ambulatory Visit: Payer: Self-pay

## 2021-02-16 ENCOUNTER — Emergency Department (HOSPITAL_COMMUNITY)
Admission: EM | Admit: 2021-02-16 | Discharge: 2021-02-16 | Disposition: A | Discharge status: Home / Self Care | Payer: Medicare Other | Attending: Emergency Medicine | Admitting: Emergency Medicine

## 2021-02-16 ENCOUNTER — Encounter (HOSPITAL_COMMUNITY): Payer: Self-pay | Admitting: Emergency Medicine

## 2021-02-16 ENCOUNTER — Telehealth (HOSPITAL_COMMUNITY): Payer: Self-pay | Admitting: Emergency Medicine

## 2021-02-16 DIAGNOSIS — F0390 Unspecified dementia without behavioral disturbance: Secondary | ICD-10-CM | POA: Diagnosis not present

## 2021-02-16 DIAGNOSIS — L22 Diaper dermatitis: Secondary | ICD-10-CM | POA: Diagnosis not present

## 2021-02-16 DIAGNOSIS — Z79899 Other long term (current) drug therapy: Secondary | ICD-10-CM | POA: Diagnosis not present

## 2021-02-16 DIAGNOSIS — R21 Rash and other nonspecific skin eruption: Secondary | ICD-10-CM | POA: Diagnosis present

## 2021-02-16 DIAGNOSIS — I10 Essential (primary) hypertension: Secondary | ICD-10-CM | POA: Diagnosis not present

## 2021-02-16 DIAGNOSIS — E039 Hypothyroidism, unspecified: Secondary | ICD-10-CM | POA: Diagnosis not present

## 2021-02-16 HISTORY — DX: Unspecified dementia, unspecified severity, without behavioral disturbance, psychotic disturbance, mood disturbance, and anxiety: F03.90

## 2021-02-16 LAB — CBC WITH DIFFERENTIAL/PLATELET
Abs Immature Granulocytes: 0 10*3/uL (ref 0.00–0.07)
Basophils Absolute: 0 10*3/uL (ref 0.0–0.1)
Basophils Relative: 1 %
Eosinophils Absolute: 0.2 10*3/uL (ref 0.0–0.5)
Eosinophils Relative: 6 %
HCT: 36 % (ref 36.0–46.0)
Hemoglobin: 11.9 g/dL — ABNORMAL LOW (ref 12.0–15.0)
Immature Granulocytes: 0 %
Lymphocytes Relative: 38 %
Lymphs Abs: 1.5 10*3/uL (ref 0.7–4.0)
MCH: 31.8 pg (ref 26.0–34.0)
MCHC: 33.1 g/dL (ref 30.0–36.0)
MCV: 96.3 fL (ref 80.0–100.0)
Monocytes Absolute: 0.4 10*3/uL (ref 0.1–1.0)
Monocytes Relative: 10 %
Neutro Abs: 1.7 10*3/uL (ref 1.7–7.7)
Neutrophils Relative %: 45 %
Platelets: 296 10*3/uL (ref 150–400)
RBC: 3.74 MIL/uL — ABNORMAL LOW (ref 3.87–5.11)
RDW: 12.1 % (ref 11.5–15.5)
WBC: 3.9 10*3/uL — ABNORMAL LOW (ref 4.0–10.5)
nRBC: 0 % (ref 0.0–0.2)

## 2021-02-16 LAB — COMPREHENSIVE METABOLIC PANEL
ALT: 10 U/L (ref 0–44)
AST: 18 U/L (ref 15–41)
Albumin: 3.1 g/dL — ABNORMAL LOW (ref 3.5–5.0)
Alkaline Phosphatase: 92 U/L (ref 38–126)
Anion gap: 10 (ref 5–15)
BUN: 14 mg/dL (ref 8–23)
CO2: 23 mmol/L (ref 22–32)
Calcium: 9.2 mg/dL (ref 8.9–10.3)
Chloride: 109 mmol/L (ref 98–111)
Creatinine, Ser: 0.84 mg/dL (ref 0.44–1.00)
GFR, Estimated: >60 mL/min (ref >60)
Glucose, Bld: 96 mg/dL (ref 70–99)
Potassium: 3.8 mmol/L (ref 3.5–5.1)
Sodium: 142 mmol/L (ref 135–145)
Total Bilirubin: 0.3 mg/dL (ref 0.3–1.2)
Total Protein: 6.8 g/dL (ref 6.5–8.1)

## 2021-02-16 LAB — TSH: TSH: 3.872 u[IU]/mL (ref 0.350–4.500)

## 2021-02-16 MED ORDER — NYSTATIN 100000 UNIT/GM EX CREA: TOPICAL_CREAM | CUTANEOUS | 0 refills | Status: DC

## 2021-02-16 MED ORDER — ZINC OXIDE 40 % EX OINT: 1.0000 "application " | TOPICAL_OINTMENT | CUTANEOUS | 0 refills | Status: DC | PRN

## 2021-02-16 MED ORDER — ZINC OXIDE 40 % EX OINT: TOPICAL_OINTMENT | Freq: Two times a day (BID) | CUTANEOUS | Status: DC

## 2021-02-16 NOTE — ED Triage Notes (Signed)
Patient arrives ambulatory with husband reports patient has a rash and skin irritation to her perineum and bottom from sitting in her urine. Patients husband is also requesting lab draw to check if her levels are correct for her medications. Patient has advanced dementia and poor historian.

## 2021-02-16 NOTE — TOC Initial Note (Signed)
Transition of Care [facility]) - Initial/Assessment Note    Patient Details  Name: Christine Hull MRN: [id number] Date of Birth: 10-19-1950  Transition of Care [facility]) CM/SW Contact:    Verdell Carmine, RN Phone Number: 02/16/2021, 5:48 PM  Clinical Narrative:                 53 Called husband to discuss Home Health. [place] has had home health previously with [facility].Currently has an aide coming in for bathing every day from [facility]. Husband states he will speak to daughter and let this RNCM know if they would like PTOT.  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) 08/09/2016   Late effect of lacunar infarction 07/19/2016   Hyperlipidemia LDL goal <100 04/28/2016   Alcohol abuse 05/31/2015   [employer] LINQ 05/09/2013   Essential hypertension 04/01/2013   Near syncope 02/02/2013   Headache 02/02/2013   VAD (vascular dementia) ([place]) 02/02/2013   Prolonged QT interval 02/02/2013   Encounter for preventive health examination 11/17/2012   Chronic back pain greater than 3 months duration 08/16/2011   Acquired hypothyroidism 08/14/2011   Tobacco abuse 08/13/2011   PCP:  Clovia Cuff, MD Pharmacy:   [id number] [place], [place] [address] Phone: [phone] Fax: [phone]  [id number] - [place], [place] [address] [place] [address] Phone: [phone] Fax: [phone]  [facility] - [place], [place] - [address] [place] [address] Phone: [phone] Fax: [phone]  [facility] [place], [place] [address] [place] [address] Phone: [phone] Fax: [phone]  [place], [place] [address], [place] [address] Phone: [phone] Fax: [phone]  [place] [address], [place] [address] Phone: [phone] Fax: [place] [facility] [place], [place] - [place] [address] [place] [address] Phone: [phone] Fax: [phone]     Social Determinants of Health ([place]) Interventions    Readmission Risk Interventions No flowsheet data found.

## 2021-02-16 NOTE — Telephone Encounter (Signed)
Patient discharged earlier by me.  Would benefit from PT OT social worker and Therapist, sports.  Order placed for this.

## 2021-02-16 NOTE — ED Provider Notes (Signed)
Accepted handoff at shift change from [facility]. Please see prior provider note for more detail.   Briefly: Patient is 71 y.o.     Plan: Follow-up on labs and discharge unless significant abnormalities exist Patient has already been fully assessed and physically examined by prior provider   Physical Exam  BP 101/85 (BP Location: Left Arm)    Pulse (!) 52    Temp 97.8 F (36.6 C) (Oral)    Resp 17    SpO2 100%   Physical Exam  Procedures  Procedures Results for orders placed or performed during the hospital encounter of 02/16/21  CBC with Differential  Result Value Ref Range   WBC 3.9 (L) 4.0 - 10.5 K/uL   RBC 3.74 (L) 3.87 - 5.11 MIL/uL   Hemoglobin 11.9 (L) 12.0 - 15.0 g/dL   HCT 36.0 36.0 - 46.0 %   MCV 96.3 80.0 - 100.0 fL   MCH 31.8 26.0 - 34.0 pg   MCHC 33.1 30.0 - 36.0 g/dL   RDW 12.1 11.5 - 15.5 %   Platelets 296 150 - 400 K/uL   nRBC 0.0 0.0 - 0.2 %   Neutrophils Relative % 45 %   Neutro Abs 1.7 1.7 - 7.7 K/uL   Lymphocytes Relative 38 %   Lymphs Abs 1.5 0.7 - 4.0 K/uL   Monocytes Relative 10 %   Monocytes Absolute 0.4 0.1 - 1.0 K/uL   Eosinophils Relative 6 %   Eosinophils Absolute 0.2 0.0 - 0.5 K/uL   Basophils Relative 1 %   Basophils Absolute 0.0 0.0 - 0.1 K/uL   Immature Granulocytes 0 %   Abs Immature Granulocytes 0.00 0.00 - 0.07 K/uL  Comprehensive metabolic panel  Result Value Ref Range   Sodium 142 135 - 145 mmol/L   Potassium 3.8 3.5 - 5.1 mmol/L   Chloride 109 98 - 111 mmol/L   CO2 23 22 - 32 mmol/L   Glucose, Bld 96 70 - 99 mg/dL   BUN 14 8 - 23 mg/dL   Creatinine, Ser 0.84 0.44 - 1.00 mg/dL   Calcium 9.2 8.9 - 10.3 mg/dL   Total Protein 6.8 6.5 - 8.1 g/dL   Albumin 3.1 (L) 3.5 - 5.0 g/dL   AST 18 15 - 41 U/L   ALT 10 0 - 44 U/L   Alkaline Phosphatase 92 38 - 126 U/L   Total Bilirubin 0.3 0.3 - 1.2 mg/dL   GFR, Estimated >60 >60 mL/min   Anion gap 10 5 - 15  TSH  Result Value Ref Range   TSH 3.872 0.350 - 4.500 uIU/mL   No  results found.  ED Course / MDM   Clinical Course as of 02/16/21 1614  Sun Feb 16, 2021  1434 Uses calmosentine ointment at home  [GL]    Clinical Course User Index [GL] Sherre Poot Adora Fridge, PA-C   Medical Decision Making Amount and/or Complexity of Data Reviewed Labs: ordered.  Risk OTC drugs. Prescription drug management.    I personally reviewed all laboratory work and imaging.  Metabolic panel without any acute abnormality specifically kidney function within normal limits and no significant electrolyte abnormalities. CBC without leukocytosis or significant anemia.   TSH within normal limits  Patient will need to follow-up with her PCP.  Vital signs within normal limits.  Will discharge with prior provider plan intact.       Pati Gallo [place], [place] 02/16/21 1616    Isla Pence, MD 02/16/21 1642

## 2021-02-16 NOTE — Discharge Instructions (Signed)
I have prescribed you two topical ointments to treat diaper rash. One is to provider protection, the other is to treat any development of yeast in the area.   The best way to improve the skin irritation is to keep the area dry and frequently change wet diapers.

## 2021-02-16 NOTE — ED Notes (Signed)
Discharge instructions reviewed with patient and caregiver. Patient and caregiver verbalized understanding of instructions. Follow-up care and medications were reviewed. Patient ambulatory with steady gait. VSS upon discharge.

## 2021-02-16 NOTE — ED Provider Notes (Signed)
[facility] EMERGENCY DEPARTMENT Provider Note   CSN: [id number] Arrival date & time: 02/16/21  1309     History  Chief Complaint  Patient presents with   Rash   Lab Check    Christine Hull is a 71 y.o. female.  Past medical history includes hypothyroidism, dementia, hypertension.  Patient presents emergency department for complaints of a rash.  Apparently she has been getting diaper rash due to incontinence at home.  She has been using over-the-counter ointment for this.  Patient's caregiver is requesting patient prescription great ointment.  He is also requesting her to have labs drawn since she has not had them in 3 years.   History is limited by patient due to to dementia, but she denies any pain and has no other complaints at this time.     Rash     Home Medications Prior to Admission medications   Medication Sig Start Date End Date Taking? Authorizing Provider  liver oil-zinc oxide (DESITIN) 40 % ointment Apply 1 application topically as needed for irritation. 02/16/21  Yes Onda Kattner, Adora Fridge, PA-C  nystatin cream (MYCOSTATIN) Apply to affected area 2 times daily 02/16/21  Yes Asaph Serena, Adora Fridge, PA-C  fish oil-omega-3 fatty acids 1000 MG capsule Take 1 g by mouth daily.     [provider]  liothyronine (CYTOMEL) 5 MCG tablet Take 1 tablet (5 mcg total) by mouth daily. 02/19/17   Crecencio Mc, MD  liothyronine (CYTOMEL) 5 MCG tablet TAKE 1 TABLET DAILY (PLEASE CONTACT OFFICE FOR A FOLLOW UP FOR FURTHER REFILLS) 04/23/17   Crecencio Mc, MD  liothyronine (CYTOMEL) 5 MCG tablet TAKE 1 TABLET DAILY (PLEASE CONTACT OFFICE FOR A FOLLOW UP FOR FURTHER REFILLS. LAST REFILL WITHOUT OFFICE VISIT) 12/14/18   Crecencio Mc, MD  losartan (COZAAR) 100 MG tablet Take 1 tablet (100 mg total) by mouth daily. 02/19/17 05/20/17  Crecencio Mc, MD  nadolol (CORGARD) 20 MG tablet TAKE 1 TABLET TWICE A DAY (NEED OFFICE VISIT FOR FURTHER REFILLS) 07/26/17   Crecencio Mc, MD  nicotine (NICODERM CQ) 14 mg/24hr patch Place 1 patch (14 mg total) onto the skin daily. 02/19/17   Crecencio Mc, MD  sertraline (ZOLOFT) 50 MG tablet TAKE 1 TABLET DAILY 08/22/19   Crecencio Mc, MD  SYNTHROID 50 MCG tablet TAKE 1 TABLET DAILY BEFORE BREAKFAST (PLEASE CONTACT OFFICE FOR A FOLLOW UP FOR FURTHER REFILLS) 04/12/17   Crecencio Mc, MD      Allergies    Patient has no known allergies.    Review of Systems   Review of Systems  Skin:  Positive for rash.  All other systems reviewed and are negative.  Physical Exam Updated Vital Signs BP 101/85 (BP Location: Left Arm)    Pulse (!) 52    Temp 97.8 F (36.6 C) (Oral)    Resp 17    SpO2 100%  Physical Exam Vitals and nursing note reviewed. Exam conducted with a chaperone present.  Constitutional:      General: She is not in acute distress.    Appearance: Normal appearance. She is well-developed. She is not ill-appearing, toxic-appearing or diaphoretic.  HENT:     Head: Normocephalic and atraumatic.     Nose: No nasal deformity.     Mouth/Throat:     Lips: Pink. No lesions.  Eyes:     General: Gaze aligned appropriately. No scleral icterus.       Right eye: No  discharge.        Left eye: No discharge.     Conjunctiva/sclera: Conjunctivae normal.     Right eye: Right conjunctiva is not injected. No exudate or hemorrhage.    Left eye: Left conjunctiva is not injected. No exudate or hemorrhage. Cardiovascular:     Rate and Rhythm: Normal rate and regular rhythm.     Pulses: Normal pulses.     Heart sounds: Normal heart sounds. No murmur heard.   No friction rub. No gallop.  Pulmonary:     Effort: Pulmonary effort is normal. No respiratory distress.     Breath sounds: Normal breath sounds. No stridor. No wheezing, rhonchi or rales.  Abdominal:     General: Abdomen is flat. There is no distension.     Palpations: Abdomen is soft.     Tenderness: There is no abdominal tenderness. There is no guarding or rebound.   Genitourinary:    Comments: There is a rash consistent with diaper dermatitis around perineum and rectal area. It is erythematous, and dried skin noted. Difficult to assess presence of yeast.  Musculoskeletal:     Right lower leg: No edema.     Left lower leg: No edema.  Skin:    General: Skin is warm and dry.     Coloration: Skin is not jaundiced.     Findings: Erythema and rash present.  Neurological:     Mental Status: She is alert. Mental status is at baseline.  Psychiatric:        Mood and Affect: Mood normal.        Speech: Speech normal.        Behavior: Behavior normal. Behavior is cooperative.    ED Results / Procedures / Treatments   Labs (all labs ordered are listed, but only abnormal results are displayed) Labs Reviewed  CBC WITH DIFFERENTIAL/PLATELET - Abnormal; Notable for the following components:      Result Value   WBC 3.9 (*)    RBC 3.74 (*)    Hemoglobin 11.9 (*)    All other components within normal limits  COMPREHENSIVE METABOLIC PANEL - Abnormal; Notable for the following components:   Albumin 3.1 (*)    All other components within normal limits  TSH    EKG None  Radiology No results found.  Procedures Procedures   Medications Ordered in ED Medications - No data to display  ED Course/ Medical Decision Making/ A&P Clinical Course as of 02/16/21 1532  Sun Feb 16, 2021  1434 Uses calmosentine ointment at home  [GL]    Clinical Course User Index [GL] Sherre Poot Adora Fridge, PA-C                           Medical Decision Making Problems Addressed: Diaper dermatitis: self-limited or minor problem  Amount and/or Complexity of Data Reviewed Independent Historian: caregiver Labs: ordered. Decision-making details documented in ED Course.  Risk OTC drugs. Prescription drug management.   This is a 71 y.o. female with a PMH of hypothyroidism, dementia, hypertension who presents to the ED with complaints of rash.  Vitals are unremarkable.  Exam with rash to perineal and bottom. It appears to be dry and excoriated. Difficult to tell whether there is presence of yeast. I reviewed past medical history and medications. Patient is not on any medications that can be level checked. Basic Labs were drawn on request from caregiver.   Impression: This is diaper dermatitis with possible yeast.  Dispo Plan: Plan to discharge with Nystatin and Desitin ointment.  3:32 PM Care of Christine Hull  transferred to [facility] and Dr. Gilford Raid at the end of my shift as the patient will require reassessment once labs/imaging have resulted. Patient presentation, ED course, and plan of care discussed with review of all pertinent labs and imaging. Please see his/her note for further details regarding further ED course and disposition. Plan at time of handoff is f/u on labs, social work consult. This may be altered or completely changed at the discretion of the oncoming team pending results of further workup.  I have seen and evaluated this patient in conjunction with my attending physician who agrees and has made changes to the plan accordingly.  Portions of this note were generated with L[employer]. Dictation errors may occur despite best attempts at proofreading.   Final Clinical Impression(s) / ED Diagnoses Final diagnoses:  Diaper dermatitis    Rx / DC Orders ED Discharge Orders          Ordered    nystatin cream (MYCOSTATIN)        02/16/21 1457    liver oil-zinc oxide (DESITIN) 40 % ointment  As needed        02/16/21 1457              Adolphus Birchwood, PA-C 02/16/21 1533    Valarie Merino, MD 02/17/21 [phone]

## 2021-06-09 ENCOUNTER — Emergency Department
Admission: EM | Admit: 2021-06-09 | Discharge: 2021-06-09 | Disposition: A | Discharge status: Home / Self Care | Payer: Medicare Other | Attending: Emergency Medicine | Admitting: Emergency Medicine

## 2021-06-09 ENCOUNTER — Other Ambulatory Visit: Payer: Self-pay

## 2021-06-09 ENCOUNTER — Encounter: Payer: Self-pay | Admitting: Emergency Medicine

## 2021-06-09 DIAGNOSIS — L03317 Cellulitis of buttock: Secondary | ICD-10-CM | POA: Insufficient documentation

## 2021-06-09 DIAGNOSIS — F0150 Vascular dementia without behavioral disturbance: Secondary | ICD-10-CM | POA: Insufficient documentation

## 2021-06-09 DIAGNOSIS — E039 Hypothyroidism, unspecified: Secondary | ICD-10-CM | POA: Diagnosis not present

## 2021-06-09 DIAGNOSIS — Z720 Tobacco use: Secondary | ICD-10-CM | POA: Diagnosis not present

## 2021-06-09 DIAGNOSIS — I10 Essential (primary) hypertension: Secondary | ICD-10-CM | POA: Diagnosis not present

## 2021-06-09 DIAGNOSIS — L0231 Cutaneous abscess of buttock: Secondary | ICD-10-CM | POA: Diagnosis present

## 2021-06-09 MED ORDER — DOXYCYCLINE MONOHYDRATE 100 MG PO TABS: 100.0000 mg | ORAL_TABLET | Freq: Two times a day (BID) | ORAL | 0 refills | Status: AC

## 2021-06-09 NOTE — ED Triage Notes (Signed)
Pt to ED with Husband who states that pts nurse said pt had a growth in her groin area, area has been there for 4 days and is getting bigger. Pt has hx/o dementia

## 2021-06-09 NOTE — ED Provider Notes (Signed)
[facility] Provider Note    Event Date/Time   First MD Initiated Contact with Patient 06/09/21 1537     (approximate)   History   Chief Complaint Abscess   HPI Christine Hull is a 71 y.o. female, history of vascular dementia, hypertension, alcohol use, tobacco use, hypothyroidism, presents to the emergency department for evaluation of suspected abscess.  She is joined by her husband, who states that the patient's nurse said that she had noticed a abscess along the lower left gluteus when she was bathing her.  First noticed 4 days ago and is progressively getting bigger.  Per husband, patient has not been complaining of any symptoms and has not been deviating from her mental baseline.  No noticeable drainage from the suspected abscess per the nurse.  Denies fever, chest pain, shortness of breath, abdominal pain, nausea/vomiting, diarrhea, foul-smelling urine/urinary symptoms, rash/lesions, or headaches.  History Limitations: Patient has vascular dementia.  History predominantly from husband.         Physical Exam  Triage Vital Signs: ED Triage Vitals  Enc Vitals Group     BP 06/09/21 1501 (!) 142/66     Pulse Rate 06/09/21 1459 61     Resp 06/09/21 1459 16     Temp 06/09/21 1502 98.3 F (36.8 C)     Temp Source 06/09/21 1502 Oral     SpO2 06/09/21 1459 95 %     Weight --      Height --      Head Circumference --      Peak Flow --      Pain Score --      Pain Loc --      Pain Edu? --      Excl. in [place]? --     Most recent vital signs: Vitals:   06/09/21 1501 06/09/21 1502  BP: (!) 142/66   Pulse:    Resp:    Temp:  98.3 F (36.8 C)  SpO2:      General: Awake, NAD.  Skin: Warm, dry. No rashes or lesions.  Eyes: PERRL. Conjunctivae normal.  CV: Good peripheral perfusion.  Resp: Normal effort.  Abd: Soft, non-tender. No distention.  Neuro: At baseline. No gross neurological deficits.   Focused Exam: 2 cm skin thickening appreciated  along the left lower gluteus, medial to the fold.  No obvious fluctuance.  Does not appear grossly erythematous.  No warmth or tenderness.  No active bleeding or discharge.  Slight cobblestoning appreciated on point-of-care ultrasound.  No drainable abscess  Physical Exam    ED Results / Procedures / Treatments  Labs (all labs ordered are listed, but only abnormal results are displayed) Labs Reviewed - No data to display   EKG N/A.   RADIOLOGY  ED Provider Interpretation: N/A.  No results found.  PROCEDURES:  Critical Care performed: N/A.  Ultrasound ED Soft Tissue  Date/Time: 06/09/2021 4:24 PM Performed by: Teodoro Spray, PA Authorized by: Teodoro Spray, PA   Procedure details:    Indications: localization of abscess     Transverse view:  Not visualized   Longitudinal view:  Not visualized   Images: not archived     Limitations:  Patient compliance Location:    Location: buttocks     Side:  Left Findings:     no abscess present    cellulitis present    no foreign body present    [place] ED: Medications - No data to  display   IMPRESSION / MDM / ASSESSMENT AND PLAN / ED COURSE  I reviewed the triage vital signs and the nursing notes.                              Differential diagnosis includes, but is not limited to, cellulitis, abscess, cyst, dermatitis  ED Course Patient appears well, vitals within normal limits for the patient.  NAD  Assessment/Plan Presentation consistent with possible cellulitis.  Mild cobblestoning appreciated on ultrasound, no clear drainable abscess, but difficult to confirm due to lack of patient cooperation.  Patient otherwise appears clinically well.  Afebrile.  We will plan to discharge with a prescription for doxycycline.  Advise the husband to have the patient follow-up with her regular doctor within 1 week to monitor the status of the infection/abscess.  We will plan to discharge.  Provided  the patient with anticipatory guidance, return precautions, and educational material. Encouraged the patient to return to the emergency department at any time if they begin to experience any new or worsening symptoms. Patient expressed understanding and agreed with the plan.       FINAL CLINICAL IMPRESSION(S) / ED DIAGNOSES   Final diagnoses:  Cellulitis of buttock     Rx / DC Orders   ED Discharge Orders          Ordered    doxycycline (ADOXA) 100 MG tablet  2 times daily        06/09/21 1627             Note:  This document was prepared using Dragon voice recognition software and may include unintentional dictation errors.   Teodoro Spray, [place] 06/09/21 1629    Rada Hay, MD 06/09/21 2120

## 2021-06-09 NOTE — Discharge Instructions (Signed)
-  Take all the antibiotics as prescribed.  -Follow-up with the patient's primary care provider within 1 week to monitor growth,  -Return to the emergency department anytime if the patient begins to experience any new or worsening symptoms.

## 2021-06-10 NOTE — ED Notes (Signed)
Family called and say patient has dementia and cannot swallow pills.  Called [facility] [address] to inform them that doxycycline suspension is okay to give in same dosage and frequency.

## 2021-07-30 ENCOUNTER — Other Ambulatory Visit: Payer: Medicare Other

## 2021-07-30 ENCOUNTER — Ambulatory Visit
Admission: EM | Admit: 2021-07-30 | Discharge: 2021-07-30 | Disposition: A | Discharge status: Home / Self Care | Payer: Medicare Other | Attending: Emergency Medicine | Admitting: Emergency Medicine

## 2021-07-30 ENCOUNTER — Ambulatory Visit (INDEPENDENT_AMBULATORY_CARE_PROVIDER_SITE_OTHER): Payer: Medicare Other

## 2021-07-30 DIAGNOSIS — M79672 Pain in left foot: Secondary | ICD-10-CM

## 2021-07-30 NOTE — ED Triage Notes (Signed)
Patient presents to Urgent Care with complaints of left foot knot near great toe noted 1 day ago. Spouse states unsure if she hit her foot. Pt has dementia unable to report if in pain or if knot was caused by injury.

## 2021-07-30 NOTE — ED Provider Notes (Addendum)
Christine Hull    CSN: [id number] Arrival date & time: 07/30/21  1123      History   Chief Complaint Chief Complaint  Patient presents with   Foot Injury    HPI Christine Hull is a 71 y.o. female.  Accompanied by her husband, patient presents with a knot on her left foot x1 day.  She has dementia and has been able to tell if she has an injury to the area.  The area is tender to touch. No redness, bruising, open wound, fever, or other symptoms.  Her medical history includes vascular dementia, CVA, hypertension, prolonged QT.  The history is provided by the spouse and medical records.    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) 08/09/2016   Late effect of lacunar infarction 07/19/2016   Hyperlipidemia LDL goal <100 04/28/2016   Alcohol abuse 05/31/2015   Medtronic LINQ 05/09/2013   Essential hypertension 04/01/2013   Near syncope 02/02/2013   Headache 02/02/2013   VAD (vascular dementia) ([place]) 02/02/2013   Prolonged QT interval 02/02/2013   Encounter for preventive health examination 11/17/2012   Chronic back pain greater than 3 months duration 08/16/2011   Acquired hypothyroidism 08/14/2011   Tobacco abuse 08/13/2011    Past Surgical History:  Procedure Laterality Date   AUGMENTATION MAMMAPLASTY Bilateral 1990's   "had implants put in; had them taken out 6 months later" (02/02/2013)   BREAST BIOPSY Right 2011   Sankar,  normal   LOOP RECORDER IMPLANT N/A 02/03/2013   Procedure: LOOP RECORDER IMPLANT;  Surgeon: Deboraha Sprang, MD;  Location: [facility] CATH LAB;  Service: Cardiovascular;  Laterality: N/A;    OB History   No obstetric history on file.       Home Medications    Prior to Admission medications   Medication Sig Start Date End Date Taking? Authorizing Provider  fish oil-omega-3 fatty acids 1000 MG capsule Take 1 g by mouth daily.     [provider]  liothyronine (CYTOMEL) 5 MCG tablet Take 1 tablet (5 mcg total) by mouth daily. 02/19/17   Crecencio Mc, MD  liothyronine (CYTOMEL) 5 MCG tablet TAKE 1 TABLET DAILY (PLEASE CONTACT OFFICE FOR A FOLLOW UP FOR FURTHER REFILLS) 04/23/17   Crecencio Mc, MD  liothyronine (CYTOMEL) 5 MCG tablet TAKE 1 TABLET DAILY (PLEASE CONTACT OFFICE FOR A FOLLOW UP FOR FURTHER REFILLS. LAST REFILL WITHOUT OFFICE VISIT) 12/14/18   Crecencio Mc, MD  liver oil-zinc oxide (DESITIN) 40 % ointment Apply 1 application topically as needed for irritation. 02/16/21   Tedd Sias, PA  losartan (COZAAR) 100 MG tablet Take 1 tablet (100 mg total) by mouth daily. 02/19/17 05/20/17  Crecencio Mc, MD  nadolol (CORGARD) 20 MG tablet TAKE 1 TABLET TWICE A DAY (NEED OFFICE VISIT FOR FURTHER REFILLS) 07/26/17   Crecencio Mc, MD  nicotine (NICODERM CQ) 14 mg/24hr patch Place 1 patch (14 mg total) onto the skin daily. 02/19/17   Crecencio Mc, MD  nystatin cream (MYCOSTATIN) Apply to affected area 2 times daily 02/16/21   Tedd Sias, PA  sertraline (ZOLOFT) 50 MG  tablet TAKE 1 TABLET DAILY 08/22/19   Crecencio Mc, MD  SYNTHROID 50 MCG tablet TAKE 1 TABLET DAILY BEFORE BREAKFAST (PLEASE CONTACT OFFICE FOR A FOLLOW UP FOR FURTHER REFILLS) 04/12/17   Crecencio Mc, MD    Family History Family History  Problem Relation Age of Onset   Early death Mother        stroke resulting from prior head injury   Stroke Mother    Early death Father        MVA   Cancer Maternal Aunt        stomach cancer   Breast cancer Neg Hx     Social History Social History   Tobacco Use   Smoking status: Every Day    Packs/day: 0.50    Years: 41.00    Total pack years: 20.50    Types: Cigarettes   Smokeless  tobacco: Never  Substance Use Topics   Alcohol use: Yes    Alcohol/week: 5.0 standard drinks of alcohol    Types: 5 Shots of liquor per week    Comment: 02/02/2013 "drink a shot  in my coffee ~ nightly before bed"   Drug use: No     Allergies   Patient has no known allergies.   Review of Systems Review of Systems  Constitutional:  Negative for chills and fever.  Musculoskeletal:  Negative for arthralgias, gait problem and joint swelling.       Knot on left foot  Skin:  Negative for color change, rash and wound.  All other systems reviewed and are negative.    Physical Exam Triage Vital Signs ED Triage Vitals  Enc Vitals Group     BP      Pulse      Resp      Temp      Temp src      SpO2      Weight      Height      Head Circumference      Peak Flow      Pain Score      Pain Loc      Pain Edu?      Excl. in [place]?    No data found.  Updated Vital Signs Pulse 63   Temp 97.9 F (36.6 C)   Resp 18   SpO2 96%   Visual Acuity Right Eye Distance:   Left Eye Distance:   Bilateral Distance:    Right Eye Near:   Left Eye Near:    Bilateral Near:     Physical Exam Vitals and nursing note reviewed.  Constitutional:      General: She is not in acute distress.    Appearance: She is well-developed.     Comments: Nonverbal during exam.  Cardiovascular:     Rate and Rhythm: Normal rate and regular rhythm.  Pulmonary:     Effort: Pulmonary effort is normal. No respiratory distress.  Musculoskeletal:        General: Normal range of motion.     Cervical back: Neck supple.       Feet:     Comments: Raised hard tender knot on left foot; see diagram.  Skin:    General: Skin is warm and dry.     Capillary Refill: Capillary refill takes less than 2 seconds.     Findings: No bruising, erythema or lesion.  Neurological:     Mental Status: She is alert.  Psychiatric:  Mood and Affect: Mood normal.        Behavior: Behavior normal.      UC Treatments /  Results  Labs (all labs ordered are listed, but only abnormal results are displayed) Labs Reviewed - No data to display  EKG   Radiology No results found.  Procedures Procedures (including critical care time)  Medications Ordered in UC Medications - No data to display  Initial Impression / Assessment and Plan / UC Course  I have reviewed the triage vital signs and the nursing notes.  Pertinent labs & imaging results that were available during my care of the patient were reviewed by me and considered in my medical decision making (see chart for details).   Left foot pain.  Left foot xray shows: "IMPRESSION:  Nodular soft tissue thickening with periarticular/eccentric osseous erosion of the first MTP suspicious for gouty arthropathy.  Nodular soft tissue thickening at the fifth MTP joint without discrete osseous erosion identified."  Discussed management with husband including rest, elevation, ice packs.  Instructed him to follow-up with an orthopedist if his wife symptoms are not improving.  Contact information for on-call Ortho provided.  He agrees to plan of care.    Final Clinical Impressions(s) / UC Diagnoses   Final diagnoses:  Left foot pain     Discharge Instructions      Rest and elevate your wife's foot.  Apply ice packs 2-3 times a day for up to 20 minutes each.      Follow up with an orthopedist if her symptoms are not improving.        ED Prescriptions   None    PDMP not reviewed this encounter.   Sharion Balloon, NP 07/30/21 1237    Sharion Balloon, NP 07/30/21 1241

## 2021-07-30 NOTE — ED Notes (Signed)
Attempted to collect BP vital x 4. No success. Provider notified.

## 2021-07-30 NOTE — Discharge Instructions (Addendum)
Rest and elevate your wife's foot.  Apply ice packs 2-3 times a day for up to 20 minutes each.      Follow up with an orthopedist if her symptoms are not improving.

## 2022-02-01 ENCOUNTER — Ambulatory Visit
Admission: EM | Admit: 2022-02-01 | Discharge: 2022-02-01 | Disposition: A | Discharge status: Home / Self Care | Payer: Medicare Other

## 2022-02-01 DIAGNOSIS — M7989 Other specified soft tissue disorders: Secondary | ICD-10-CM | POA: Diagnosis not present

## 2022-02-01 NOTE — ED Provider Notes (Signed)
Christine Hull    CSN: [id number] Arrival date & time: 02/01/22  1121      History   Chief Complaint Chief Complaint  Patient presents with   Arm Pain    HPI Christine Hull is a 72 y.o. female.    Arm Pain    Patient presents to urgent care with swelling in her left hand and arm noted during bath by CNA. She is accompanied by her husband. Patient with hx of dementia and unable to communicate her symptoms.  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) 08/09/2016   Late effect of lacunar infarction 07/19/2016   Hyperlipidemia LDL goal <100 04/28/2016   Alcohol abuse 05/31/2015   Medtronic LINQ 05/09/2013   Essential hypertension 04/01/2013   Near syncope 02/02/2013   Headache 02/02/2013   VAD (vascular dementia) ([place]) 02/02/2013   Prolonged QT interval 02/02/2013   Encounter for preventive health examination 11/17/2012   Chronic back pain greater than 3 months duration 08/16/2011   Acquired hypothyroidism 08/14/2011   Tobacco abuse 08/13/2011    Past Surgical History:  Procedure Laterality Date   AUGMENTATION MAMMAPLASTY Bilateral 1990's   "had implants put in; had them taken out 6 months later" (02/02/2013)   BREAST BIOPSY Right 2011   Sankar,  normal   LOOP RECORDER IMPLANT N/A 02/03/2013   Procedure: LOOP RECORDER IMPLANT;  Surgeon: Deboraha Sprang, MD;  Location: [facility] CATH LAB;  Service: Cardiovascular;  Laterality: N/A;    OB History   No obstetric history on file.      Home Medications    Prior to Admission medications   Medication Sig Start Date End Date Taking? Authorizing Provider  fish oil-omega-3 fatty acids 1000 MG capsule Take 1 g  by mouth daily.     [provider]  liothyronine (CYTOMEL) 5 MCG tablet Take 1 tablet (5 mcg total) by mouth daily. 02/19/17   Crecencio Mc, MD  liothyronine (CYTOMEL) 5 MCG tablet TAKE 1 TABLET DAILY (PLEASE CONTACT OFFICE FOR A FOLLOW UP FOR FURTHER REFILLS) 04/23/17   Crecencio Mc, MD  liothyronine (CYTOMEL) 5 MCG tablet TAKE 1 TABLET DAILY (PLEASE CONTACT OFFICE FOR A FOLLOW UP FOR FURTHER REFILLS. LAST REFILL WITHOUT OFFICE VISIT) 12/14/18   Crecencio Mc, MD  liver oil-zinc oxide (DESITIN) 40 % ointment Apply 1 application topically as needed for irritation. 02/16/21   Tedd Sias, PA  losartan (COZAAR) 100 MG tablet Take 1 tablet (100 mg total) by mouth daily. 02/19/17 05/20/17  Crecencio Mc, MD  nadolol (CORGARD) 20 MG tablet TAKE 1 TABLET TWICE A DAY (NEED OFFICE VISIT FOR FURTHER REFILLS) 07/26/17   Crecencio Mc, MD  nicotine (NICODERM CQ) 14 mg/24hr patch Place 1 patch (14 mg total) onto the skin daily. 02/19/17   Crecencio Mc, MD  nystatin cream (MYCOSTATIN) Apply to affected area 2 times daily 02/16/21   Tedd Sias, PA  sertraline (ZOLOFT) 50 MG tablet TAKE 1 TABLET DAILY 08/22/19   Crecencio Mc, MD  SYNTHROID 50 MCG tablet TAKE 1 TABLET DAILY BEFORE BREAKFAST (PLEASE CONTACT OFFICE FOR A FOLLOW UP FOR  FURTHER REFILLS) 04/12/17   Crecencio Mc, MD    Family History Family History  Problem Relation Age of Onset   Early death Mother        stroke resulting from prior head injury   Stroke Mother    Early death Father        MVA   Cancer Maternal Aunt        stomach cancer   Breast cancer Neg Hx     Social History Social History   Tobacco Use   Smoking status: Every Day    Packs/day: 0.50    Years: 41.00    Total pack years: 20.50    Types: Cigarettes   Smokeless tobacco: Never  Substance Use Topics   Alcohol use: Yes    Alcohol/week: 5.0 standard drinks of alcohol    Types: 5 Shots of liquor per week    Comment: 02/02/2013 "drink a shot  in my  coffee ~ nightly before bed"   Drug use: No     Allergies   Patient has no known allergies.   Review of Systems Review of Systems   Physical Exam Triage Vital Signs ED Triage Vitals [02/01/22 1203]  Enc Vitals Group     BP      Pulse      Resp 16     Temp (!) 97.5 F (36.4 C)     Temp Source Temporal     SpO2      Weight      Height      Head Circumference      Peak Flow      Pain Score      Pain Loc      Pain Edu?      Excl. in [place]?    No data found.  Updated Vital Signs Temp (!) 97.5 F (36.4 C) (Temporal)   Resp 16   Visual Acuity Right Eye Distance:   Left Eye Distance:   Bilateral Distance:    Right Eye Near:   Left Eye Near:    Bilateral Near:     Physical Exam Vitals reviewed.  Constitutional:      Appearance: Normal appearance.  Musculoskeletal:     Right forearm: Swelling present. No deformity, tenderness or bony tenderness.       Arms:       Hands:     Comments: There are no signs of left extremity pain upon palpation.  Skin:    General: Skin is warm and dry.  Neurological:     General: No focal deficit present.     Mental Status: She is alert. Mental status is at baseline.  Psychiatric:        Mood and Affect: Mood normal.        Behavior: Behavior normal.      UC Treatments / Results  Labs (all labs ordered are listed, but only abnormal results are displayed) Labs Reviewed - No data to display  EKG   Radiology No results found.  Procedures Procedures (including critical care time)  Medications Ordered in UC Medications - No data to display  Initial Impression / Assessment and Plan / UC Course  I have reviewed the triage vital signs and the nursing notes.  Pertinent labs & imaging results that were available during my care of the patient were reviewed by me and considered in my medical decision making (see chart for details).   Patient is afebrile here without recent antipyretics. Satting well on room  air. Overall  is well appearing, well hydrated, without respiratory distress.  Neurologically at baseline.  There is some edema noted at the ulnar aspect of her left forearm without erythema or tenderness with palpation.  Differentials include infection, thrombus, injury.  Infection and thrombus is unlikely given lack of erythema and pain.  Also the swelling appears to be focal rather than general in the extremity with good pulses and cap refill.  Top differential is injury, though etiology is unclear given her symptoms.  Recommended watching and waiting for worsening symptoms.   Final Clinical Impressions(s) / UC Diagnoses   Final diagnoses:  None   Discharge Instructions   None    ED Prescriptions   None    PDMP not reviewed this encounter.   Rose Phi, Brandt 02/01/22 1226

## 2022-02-01 NOTE — Discharge Instructions (Signed)
Follow up here or with your primary care provider if your symptoms are worsening or not improving.     

## 2022-02-01 NOTE — ED Triage Notes (Signed)
Pt. Presents to UC w/ c/o swelling in the left hand and arm. Pt's husband states he was notified by the CNA that gave the patient a bath this morning.

## 2022-07-08 ENCOUNTER — Other Ambulatory Visit: Payer: Self-pay

## 2022-07-08 ENCOUNTER — Emergency Department
Admission: EM | Admit: 2022-07-08 | Discharge: 2022-07-08 | Disposition: A | Discharge status: Home / Self Care | Payer: Medicare Other | Attending: Emergency Medicine | Admitting: Emergency Medicine

## 2022-07-08 ENCOUNTER — Encounter: Payer: Self-pay | Admitting: Emergency Medicine

## 2022-07-08 ENCOUNTER — Ambulatory Visit
Admission: EM | Admit: 2022-07-08 | Discharge: 2022-07-08 | Disposition: A | Discharge status: Home / Self Care | Payer: Medicare Other

## 2022-07-08 DIAGNOSIS — L0291 Cutaneous abscess, unspecified: Secondary | ICD-10-CM

## 2022-07-08 DIAGNOSIS — I10 Essential (primary) hypertension: Secondary | ICD-10-CM | POA: Diagnosis not present

## 2022-07-08 DIAGNOSIS — E039 Hypothyroidism, unspecified: Secondary | ICD-10-CM | POA: Diagnosis not present

## 2022-07-08 DIAGNOSIS — L02611 Cutaneous abscess of right foot: Secondary | ICD-10-CM | POA: Insufficient documentation

## 2022-07-08 DIAGNOSIS — F0150 Vascular dementia without behavioral disturbance: Secondary | ICD-10-CM | POA: Diagnosis not present

## 2022-07-08 MED ORDER — LIDOCAINE HCL (PF) 1 % IJ SOLN
5.0000 mL | Freq: Once | INTRAMUSCULAR | Status: AC
  Administered 2022-07-08: 5 mL via INTRADERMAL
  Filled 2022-07-08: qty 5

## 2022-07-08 MED ORDER — MIDAZOLAM HCL 2 MG/2ML IJ SOLN
2.0000 mg | Freq: Once | INTRAMUSCULAR | Status: AC
Start: 2022-07-08 — End: 2022-07-08
  Administered 2022-07-08: 2 mg via INTRAVENOUS
  Filled 2022-07-08: qty 2

## 2022-07-08 MED ORDER — CEPHALEXIN 500 MG PO CAPS: 500.0000 mg | ORAL_CAPSULE | Freq: Four times a day (QID) | ORAL | 0 refills | Status: DC

## 2022-07-08 MED ORDER — SULFAMETHOXAZOLE-TRIMETHOPRIM 200-40 MG/5ML PO SUSP: 20.0000 mL | Freq: Two times a day (BID) | ORAL | 0 refills | Status: AC

## 2022-07-08 NOTE — ED Triage Notes (Signed)
Patient presents to UC with son for rash to left forearm x 1 week. States it has improved, not sure the cause of the rash. Also concerned with abscess to right foot x 1 week. Yesterday the home health RN noted increased swelling, redness. Cleaned with alcohol.

## 2022-07-08 NOTE — Discharge Instructions (Signed)
Recommend evaluation in the emergency room

## 2022-07-08 NOTE — ED Provider Notes (Signed)
[facility] Provider Note    Event Date/Time   First MD Initiated Contact with Patient 07/08/22 1213     (approximate)   History   Abscess   HPI  Christine Hull is a 72 y.o. female with PMH of vascular dementia, hypertension and hypothyroidism who presents for evaluation of a right foot abscess.  Patient was sent over from urgent care as they felt and due to patient's dementia and unwillingness to cooperate they would not be able to successfully drain the abscess.  Patient lives at home with her husband who is her main caretaker but also has CNA's to assist.  Her husband noticed the abscess about a week ago but recently it has become increasingly painful.      Physical Exam   Triage Vital Signs: ED Triage Vitals [07/08/22 1120]  Enc Vitals Group     BP (!) 151/101     Pulse Rate (!) 58     Resp 18     Temp 97.9 F (36.6 C)     Temp Source Oral     SpO2 98 %     Weight      Height      Head Circumference      Peak Flow      Pain Score      Pain Loc      Pain Edu?      Excl. in [place]?     Most recent vital signs: Vitals:   07/08/22 1120  BP: (!) 151/101  Pulse: (!) 58  Resp: 18  Temp: 97.9 F (36.6 C)  SpO2: 98%     General: Awake, no distress.  CV:  Good peripheral perfusion.  RRR. Resp:  Normal effort.  CTAB. Abd:  No distention.  Other:  2 cm boil to the right lateral foot with overlying erythema, warmth and TTP.  Lymphatic streaking up the patient's right shin.   ED Results / Procedures / Treatments   Labs (all labs ordered are listed, but only abnormal results are displayed) Labs Reviewed - No data to display   RADIOLOGY  Bedside ultrasound used to confirm the presence of an abscess.  I estimate the abscess should be about 2 cm in diameter.   PROCEDURES:  Critical Care performed: No  ..Incision and Drainage  Date/Time: 07/08/2022 2:57 PM  Performed by: Cameron Ali, PA-C Authorized by: Cameron Ali, PA-C   Consent:    Consent obtained:  Verbal   Consent given by:  Guardian   Risks, benefits, and alternatives were discussed: yes     Risks discussed:  Bleeding, incomplete drainage and infection   Alternatives discussed:  No treatment Location:    Type:  Abscess   Size:  2 cm   Location:  Lower extremity   Lower extremity location:  Foot   Foot location:  R foot Pre-procedure details:    Skin preparation:  Povidone-iodine Sedation:    Sedation type:  Anxiolysis Anesthesia:    Anesthesia method:  Local infiltration   Local anesthetic:  Lidocaine 1% w/o epi Procedure type:    Complexity:  Simple Procedure details:    Ultrasound guidance: no     Needle aspiration: no     Incision types:  Stab incision   Incision depth:  Subcutaneous   Wound management:  Probed and deloculated   Drainage:  Purulent and bloody   Drainage amount:  Moderate   Wound treatment:  Wound left open Post-procedure details:  Procedure completion:  Tolerated well, no immediate complications    MEDICATIONS ORDERED IN ED: Medications  lidocaine (PF) (XYLOCAINE) 1 % injection 5 mL (has no administration in time range)  midazolam (VERSED) injection 2 mg (2 mg Intravenous Given 07/08/22 1420)     IMPRESSION / MDM / ASSESSMENT AND PLAN / ED COURSE  I reviewed the triage vital signs and the nursing notes.                             Patient presented for evaluation of an abscess on the right foot. Differential diagnosis includes, but is not limited to, cellulitis, abscess, erysipelas, cyst.  Patient's presentation is most consistent with acute illness / injury with system symptoms.  Bedside ultrasound was used to confirm the presence of the abscess.  Due to patient's history of dementia she was given Versed prior to the I&D.  See procedure note above.  Wound was covered with gauze.  Patient's husband was instructed on wound care.  I will send patient home with a course of oral antibiotics.   Patient can follow-up with her PCP as needed.  All questions were answered and patient was stable at discharge.      FINAL CLINICAL IMPRESSION(S) / ED DIAGNOSES   Final diagnoses:  Abscess of right foot     Rx / DC Orders   ED Discharge Orders          Ordered    cephALEXin (KEFLEX) 500 MG capsule  4 times daily,   Status:  Discontinued        07/08/22 1440    sulfamethoxazole-trimethoprim (BACTRIM) 200-40 MG/5ML suspension  2 times daily        07/08/22 1455             Note:  This document was prepared using Dragon voice recognition software and may include unintentional dictation errors.   Cameron Ali, PA-C 07/08/22 1502    Dionne Bucy, MD 07/08/22 2012

## 2022-07-08 NOTE — Discharge Instructions (Signed)
Change the dressing once a day.  Keep clean and dry.

## 2022-07-08 NOTE — ED Provider Notes (Signed)
[facility]    CSN: [id number] Arrival date & time: 07/08/22  1018      History   Chief Complaint No chief complaint on file.   HPI Christine Hull is a 72 y.o. female with dementia and is accompanied by her caregivers.   HPI  They endorse presence of "boil" on her R foot accompanied by rash x 1 week.  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  Patient's vital signs were unable to be assessed during triage due to patient intolerance (pushed as needed away).  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) 08/09/2016   Late effect of lacunar infarction 07/19/2016   Hyperlipidemia LDL goal <100 04/28/2016   Alcohol abuse 05/31/2015   Medtronic LINQ 05/09/2013   Essential hypertension 04/01/2013   Near syncope 02/02/2013   Headache 02/02/2013   VAD (vascular dementia) (HCC) 02/02/2013   Prolonged QT interval 02/02/2013   Encounter for preventive health examination 11/17/2012   Chronic back pain greater than 3 months duration 08/16/2011   Acquired hypothyroidism 08/14/2011   Tobacco abuse 08/13/2011    Past Surgical History:  Procedure Laterality Date   AUGMENTATION MAMMAPLASTY Bilateral 1990's   "had implants put in; had them taken out 6 months later" (02/02/2013)   BREAST BIOPSY Right 2011   Sankar,  normal   LOOP RECORDER IMPLANT N/A 02/03/2013   Procedure: LOOP RECORDER IMPLANT;  Surgeon: Duke Salvia, MD;  Location: [facility] CATH LAB;  Service: Cardiovascular;  Laterality: N/A;    OB History   No obstetric history on file.      Home Medications    Prior to Admission medications    Medication Sig Start Date End Date Taking? Authorizing Provider  fish oil-omega-3 fatty acids 1000 MG capsule Take 1 g by mouth daily.     [provider]  liothyronine (CYTOMEL) 5 MCG tablet Take 1 tablet (5 mcg total) by mouth daily. 02/19/17   Sherlene Shams, MD  liothyronine (CYTOMEL) 5 MCG tablet TAKE 1 TABLET DAILY (PLEASE CONTACT OFFICE FOR A FOLLOW UP FOR FURTHER REFILLS) 04/23/17   Sherlene Shams, MD  liothyronine (CYTOMEL) 5 MCG tablet TAKE 1 TABLET DAILY (PLEASE CONTACT OFFICE FOR A FOLLOW UP FOR FURTHER REFILLS. LAST REFILL WITHOUT OFFICE VISIT) 12/14/18   Sherlene Shams, MD  liver oil-zinc oxide (DESITIN) 40 % ointment Apply 1 application topically as needed for irritation. 02/16/21   Gailen Shelter, PA  losartan (COZAAR) 100 MG tablet Take 1 tablet (100 mg total) by mouth daily. 02/19/17 05/20/17  Sherlene Shams, MD  nadolol (CORGARD) 20 MG tablet TAKE 1 TABLET TWICE A DAY (NEED OFFICE VISIT FOR FURTHER REFILLS) 07/26/17   Sherlene Shams, MD  nicotine (NICODERM CQ) 14 mg/24hr patch Place 1 patch (14 mg total) onto the skin daily. 02/19/17   Sherlene Shams, MD  nystatin cream (MYCOSTATIN) Apply to affected area 2 times daily 02/16/21   Gailen Shelter, PA  sertraline (ZOLOFT) 50 MG tablet TAKE 1 TABLET DAILY 08/22/19   Sherlene Shams, MD  SYNTHROID 50 MCG tablet TAKE 1  TABLET DAILY BEFORE BREAKFAST (PLEASE CONTACT OFFICE FOR A FOLLOW UP FOR FURTHER REFILLS) 04/12/17   Sherlene Shams, MD    Family History Family History  Problem Relation Age of Onset   Early death Mother        stroke resulting from prior head injury   Stroke Mother    Early death Father        MVA   Cancer Maternal Aunt        stomach cancer   Breast cancer Neg Hx     Social History Social History   Tobacco Use   Smoking status: Every Day    Packs/day: 0.50    Years: 41.00    Additional pack years: 0.00    Total pack years: 20.50    Types: Cigarettes   Smokeless tobacco: Never  Substance Use  Topics   Alcohol use: Yes    Alcohol/week: 5.0 standard drinks of alcohol    Types: 5 Shots of liquor per week    Comment: 02/02/2013 "drink a shot  in my coffee ~ nightly before bed"   Drug use: No     Allergies   Patient has no known allergies.   Review of Systems Review of Systems   Physical Exam Triage Vital Signs ED Triage Vitals  Enc Vitals Group     BP      Pulse      Resp      Temp      Temp src      SpO2      Weight      Height      Head Circumference      Peak Flow      Pain Score      Pain Loc      Pain Edu?      Excl. in GC?    No data found.  Updated Vital Signs There were no vitals taken for this visit.  Visual Acuity Right Eye Distance:   Left Eye Distance:   Bilateral Distance:    Right Eye Near:   Left Eye Near:    Bilateral Near:     Physical Exam Vitals reviewed.  Musculoskeletal:     Right foot: Swelling and tenderness present.       Feet:  Skin:    General: Skin is warm and dry.     Findings: Rash present.  Neurological:     General: No focal deficit present.     Mental Status: She is alert and oriented to person, place, and time.  Psychiatric:        Attention and Perception: She is inattentive.        Speech: She is noncommunicative.        Behavior: Behavior is uncooperative.        Cognition and Memory: Cognition is impaired.        Judgment: Judgment is inappropriate.      UC Treatments / Results  Labs (all labs ordered are listed, but only abnormal results are displayed) Labs Reviewed - No data to display  EKG   Radiology No results found.  Procedures Procedures (including critical care time)  Medications Ordered in UC Medications - No data to display  Initial Impression / Assessment and Plan / UC Course  I have reviewed the triage vital signs and the nursing notes.  Pertinent labs & imaging results that were available during my care of the patient were reviewed by me and considered in my medical  decision making (see chart for details).   Christine Hull is a 72 y.o. female presenting with Abscess of R foot. Patient is afebrile without recent antipyretics, satting well on room air. Overall is well appearing, well hydrated, without respiratory distress.  There is a soft tissue abscess on the lateral edge of her right midfoot.  It is tender to palpation and fluctuant.  Reviewed relevant chart history. Additional history obtained from patient family/caregiver present during the exam.  Likely abscess of right lateral surface of midfoot requiring incision and drainage.  Given the patient is not tolerant of vital signs including pulse oximetry and blood pressure, I expressed my hesitation to the patient's caregiver (husband) about evaluating and treating the patient.  I informed him that it is likely that the patient would react poorly to the introduction of lidocaine to her right foot and we would not be able to complete the treatment plan.  Patient's caregiver acknowledged understanding and agreement with my assessment and stated he would take the patient to the ED.  Final Clinical Impressions(s) / UC Diagnoses   Final diagnoses:  None   Discharge Instructions   None    ED Prescriptions   None    PDMP not reviewed this encounter.   Charma Igo, [place] 07/08/22 1046

## 2022-07-08 NOTE — ED Notes (Signed)
Patient is being discharged from the Urgent Care and sent to the Emergency Department via POV with son . Per Stephen,NP, patient is in need of higher level of care due to abscess requiring sedation due to dementia hx. Patient is aware and verbalizes understanding of plan of care.  Vitals:   07/08/22 1034  Resp: 18  Temp: 97.8 F (36.6 C)

## 2022-07-08 NOTE — ED Triage Notes (Signed)
Patient to ED via POV with husband of abscess on the outside of right foot. Been there x1 week. Seen by UC unable to lance due patient's dementia. Patient needing multiple redirection.

## 2022-08-28 ENCOUNTER — Ambulatory Visit
Admission: EM | Admit: 2022-08-28 | Discharge: 2022-08-28 | Disposition: A | Discharge status: Home / Self Care | Payer: Medicare Other

## 2022-08-28 DIAGNOSIS — S81812A Laceration without foreign body, left lower leg, initial encounter: Secondary | ICD-10-CM | POA: Diagnosis not present

## 2022-08-28 NOTE — Discharge Instructions (Addendum)
Change dressing daily.  Watch for signs of infection. Follow up here or with your primary care provider if your symptoms are worsening or not improving.

## 2022-08-28 NOTE — ED Triage Notes (Signed)
Patient presents to UC for wound check to left lower leg noted yesterday. Husband states neosporin was applied and secured with bandage.

## 2022-08-28 NOTE — ED Provider Notes (Signed)
[facility]    CSN: [id number] Arrival date & time: 08/28/22  1112      History   Chief Complaint Chief Complaint  Patient presents with   Wound Check    HPI CAZANDRA TURO is a 72 y.o. female.    Wound Check  Accompanied by her husband/caregiver.  Presents to urgent care for wound check to left lower leg.  Wound noted yesterday.  Patient is accompanied by her husband who states Neosporin was applied and secured with a bandage.  PMH includes hypertension and other cardiac related issues.  Unknown cause of her injury.  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) 08/09/2016   Late effect of lacunar infarction 07/19/2016   Hyperlipidemia LDL goal <100 04/28/2016   Alcohol abuse 05/31/2015   Medtronic LINQ 05/09/2013   Essential hypertension 04/01/2013   Near syncope 02/02/2013   Headache 02/02/2013   VAD (vascular dementia) (HCC) 02/02/2013   Prolonged QT interval 02/02/2013   Encounter for preventive health examination 11/17/2012   Chronic back pain greater than 3 months duration 08/16/2011   Acquired hypothyroidism 08/14/2011   Tobacco abuse 08/13/2011    Past Surgical History:  Procedure Laterality Date   AUGMENTATION MAMMAPLASTY Bilateral 1990's   "had implants put in; had them taken out 6 months later" (02/02/2013)   BREAST BIOPSY Right 2011   Sankar,  normal   LOOP RECORDER IMPLANT N/A 02/03/2013   Procedure: LOOP RECORDER IMPLANT;  Surgeon: Duke Salvia, MD;  Location: [facility] CATH LAB;  Service: Cardiovascular;  Laterality: N/A;    OB History   No obstetric history on file.      Home Medications    Prior to Admission medications    Medication Sig Start Date End Date Taking? Authorizing Provider  fish oil-omega-3 fatty acids 1000 MG capsule Take 1 g by mouth daily.     [provider]  liothyronine (CYTOMEL) 5 MCG tablet Take 1 tablet (5 mcg total) by mouth daily. 02/19/17   Sherlene Shams, MD  liothyronine (CYTOMEL) 5 MCG tablet TAKE 1 TABLET DAILY (PLEASE CONTACT OFFICE FOR A FOLLOW UP FOR FURTHER REFILLS) 04/23/17   Sherlene Shams, MD  liothyronine (CYTOMEL) 5 MCG tablet TAKE 1 TABLET DAILY (PLEASE CONTACT OFFICE FOR A FOLLOW UP FOR FURTHER REFILLS. LAST REFILL WITHOUT OFFICE VISIT) 12/14/18   Sherlene Shams, MD  liver oil-zinc oxide (DESITIN) 40 % ointment Apply 1 application topically as needed for irritation. 02/16/21   Gailen Shelter, PA  losartan (COZAAR) 100 MG tablet Take 1 tablet (100 mg total) by mouth daily. 02/19/17 05/20/17  Sherlene Shams, MD  nadolol (CORGARD) 20 MG tablet TAKE 1 TABLET TWICE A DAY (NEED OFFICE VISIT FOR FURTHER REFILLS) 07/26/17   Sherlene Shams, MD  nicotine (NICODERM CQ) 14 mg/24hr patch Place 1 patch (14 mg total) onto the skin daily. 02/19/17   Sherlene Shams, MD  nystatin cream (MYCOSTATIN) Apply to affected area 2 times daily 02/16/21   Gailen Shelter, PA  sertraline (ZOLOFT) 50 MG tablet TAKE 1 TABLET DAILY 08/22/19   Sherlene Shams, MD  SYNTHROID 50 MCG  tablet TAKE 1 TABLET DAILY BEFORE BREAKFAST (PLEASE CONTACT OFFICE FOR A FOLLOW UP FOR FURTHER REFILLS) 04/12/17   Sherlene Shams, MD    Family History Family History  Problem Relation Age of Onset   Early death Mother        stroke resulting from prior head injury   Stroke Mother    Early death Father        MVA   Cancer Maternal Aunt        stomach cancer   Breast cancer Neg Hx     Social History Social History   Tobacco Use   Smoking status: Every Day    Current packs/day: 0.50    Average packs/day: 0.5 packs/day for 41.0 years (20.5 ttl pk-yrs)    Types: Cigarettes   Smokeless tobacco: Never  Substance Use  Topics   Alcohol use: Yes    Alcohol/week: 5.0 standard drinks of alcohol    Types: 5 Shots of liquor per week    Comment: 02/02/2013 "drink a shot  in my coffee ~ nightly before bed"   Drug use: No     Allergies   Patient has no known allergies.   Review of Systems Review of Systems   Physical Exam Triage Vital Signs ED Triage Vitals [08/28/22 1239]  Encounter Vitals Group     BP      Systolic BP Percentile      Diastolic BP Percentile      Pulse Rate 88     Resp 18     Temp (!) 96.5 F (35.8 C)     Temp Source Temporal     SpO2 97 %     Weight      Height      Head Circumference      Peak Flow      Pain Score      Pain Loc      Pain Education      Exclude from Growth Chart    No data found.  Updated Vital Signs Pulse 88   Temp (!) 96.5 F (35.8 C) (Temporal)   Resp 18   SpO2 97%   Visual Acuity Right Eye Distance:   Left Eye Distance:   Bilateral Distance:    Right Eye Near:   Left Eye Near:    Bilateral Near:     Physical Exam Vitals reviewed.  Constitutional:      Appearance: Normal appearance.  Skin:    General: Skin is warm and dry.     Findings: Lesion present.       Neurological:     General: No focal deficit present.     Mental Status: She is alert. Mental status is at baseline. She is disoriented.  Psychiatric:     Comments: At baseline      UC Treatments / Results  Labs (all labs ordered are listed, but only abnormal results are displayed) Labs Reviewed - No data to display  EKG   Radiology No results found.  Procedures Procedures (including critical care time)  Medications Ordered in UC Medications - No data to display  Initial Impression / Assessment and Plan / UC Course  I have reviewed the triage vital signs and the nursing notes.  Pertinent labs & imaging results that were available during my care of the patient were reviewed by me and considered in my medical decision making (see chart for details).    YAA HYETT is a 72 y.o. female presenting with skin  tear. Patient is afebrile without recent antipyretics, satting well on room air. Overall is well appearing , well hydrated, without respiratory distress.  Skin tear, approximately 2 cm's in diameter, present on posterior left lower leg.  No erythema.  Clear discharge is present.  Reviewed relevant chart history. Additional history obtained from patient family/caregiver present during the exam.  Unclear etiology for her skin tear.  Possible abrasion.  Dressed wound with Xeroform dressing covered with gauze wrapped with Coban.  Gave instructions for patient's husband to watch for signs and symptoms of infection.  Change dressing at least daily.  Wash with soap and water.  Counseled patient on potential for adverse effects with medications prescribed/recommended today, ER and return-to-clinic precautions discussed, patient verbalized understanding and agreement with care plan.  Final Clinical Impressions(s) / UC Diagnoses   Final diagnoses:  None   Discharge Instructions   None    ED Prescriptions   None    PDMP not reviewed this encounter.   Charma Igo, [place] 08/28/22 1259

## 2022-10-24 ENCOUNTER — Emergency Department
Admission: EM | Admit: 2022-10-24 | Discharge: 2022-10-24 | Disposition: A | Discharge status: Home / Self Care | Payer: Medicare Other | Attending: Emergency Medicine | Admitting: Emergency Medicine

## 2022-10-24 ENCOUNTER — Other Ambulatory Visit: Payer: Self-pay

## 2022-10-24 DIAGNOSIS — H1131 Conjunctival hemorrhage, right eye: Secondary | ICD-10-CM | POA: Diagnosis not present

## 2022-10-24 DIAGNOSIS — F0390 Unspecified dementia without behavioral disturbance: Secondary | ICD-10-CM | POA: Diagnosis not present

## 2022-10-24 DIAGNOSIS — H5789 Other specified disorders of eye and adnexa: Secondary | ICD-10-CM | POA: Diagnosis present

## 2022-10-24 MED ORDER — FLUORESCEIN SODIUM 1 MG OP STRP
1.0000 | ORAL_STRIP | Freq: Once | OPHTHALMIC | Status: AC
  Administered 2022-10-24: 1 via OPHTHALMIC
  Filled 2022-10-24: qty 1

## 2022-10-24 NOTE — ED Notes (Signed)
Pt A&O x4, no obvious distress noted, respirations regular/unlabored. Pt verbalizes understanding of discharge instructions. Pt wheeled from ED without incident.

## 2022-10-24 NOTE — ED Provider Notes (Signed)
    [facility] Emergency Department Provider Note     Event Date/Time   First MD Initiated Contact with Patient 10/24/22 1512     (approximate)   History   Eye Problem   HPI  Christine Hull is a 72 y.o. female presents to the ED with daughter and husband, with concern for possible ruptured blood vessel in the right eye.  Patient recent diagnosed with COVID, taken Paxlovid.  She has had some cough and congestion in the first few days.  Patient has dementia at baseline and is nonverbal.  No other complaints at this time.  No reports of any facial or eye trauma.  Physical Exam   Triage Vital Signs: ED Triage Vitals [10/24/22 1322]  Encounter Vitals Group     BP      Systolic BP Percentile      Diastolic BP Percentile      Pulse      Resp      Temp      Temp src      SpO2      Weight 125 lb (56.7 kg)     Height 5\' 5"  (1.651 m)     Head Circumference      Peak Flow      Pain Score 0     Pain Loc      Pain Education      Exclude from Growth Chart     Most recent vital signs: There were no vitals filed for this visit.  General Awake, no distress. NAD HEENT NCAT. PERRL. EOMI. right eye with evidence of a lateral subconjunctival hemorrhage.  No gross eye deformity or foreign body appreciated.  No fluorescein dye uptake noted.  No rhinorrhea. Mucous membranes are moist.  CV:  Good peripheral perfusion.  RESP:  Normal effort.  ABD:  No distention.    ED Results / Procedures / Treatments   Labs (all labs ordered are listed, but only abnormal results are displayed) Labs Reviewed - No data to display   EKG   RADIOLOGY   No results found.   PROCEDURES:  Critical Care performed: No  Procedures   MEDICATIONS ORDERED IN ED: Medications  fluorescein ophthalmic strip 1 strip (1 strip Right Eye Given 10/24/22 1712)     IMPRESSION / MDM / ASSESSMENT AND PLAN / ED COURSE  I reviewed the triage vital signs and the nursing notes.                               Differential diagnosis includes, but is not limited to, subchondral hemorrhage, conjunctivitis, corneal abrasion, retained foreign body  Patient's presentation is most consistent with acute complicated illness / injury requiring diagnostic workup.  Patient's diagnosis is consistent with subconjunctival hemorrhage on the right eye. Patient is to follow up with primary provider as discussed, as needed or otherwise directed. Patient is given ED precautions to return to the ED for any worsening or new symptoms.   FINAL CLINICAL IMPRESSION(S) / ED DIAGNOSES   Final diagnoses:  Subconjunctival hemorrhage of right eye     Rx / DC Orders   ED Discharge Orders     None        Note:  This document was prepared using Dragon voice recognition software and may include unintentional dictation errors.    Lissa Hoard, PA-C 10/24/22 1724    Sharman Cheek, MD 10/25/22 2310

## 2022-10-24 NOTE — ED Triage Notes (Signed)
Pt daughter sts that her mother woke up this AM with a possible busted blood vessel in her right eye. Daughter sts that pt is COVID positive at this time. Pt does have dementia and is non verbal. Pt is not letting staff get VS while in triage. Pt skin is warm and dry. Mucosal membranes are pink and wet. Pt is ambulatory with assistance.

## 2022-10-24 NOTE — Discharge Instructions (Signed)
No signs of a serious injury to the eye.  Symptoms should resolve  in a week or so.

## 2022-12-04 ENCOUNTER — Inpatient Hospital Stay
Admission: EM | Admit: 2022-12-04 | Discharge: 2022-12-28 | DRG: 025 | Disposition: A | Discharge status: Skilled Nursing Facility | Payer: Medicare Other | Attending: Internal Medicine | Admitting: Internal Medicine

## 2022-12-04 ENCOUNTER — Emergency Department: Payer: Medicare Other

## 2022-12-04 ENCOUNTER — Other Ambulatory Visit: Payer: Self-pay | Admitting: Nurse Practitioner

## 2022-12-04 ENCOUNTER — Other Ambulatory Visit: Payer: Self-pay

## 2022-12-04 DIAGNOSIS — E785 Hyperlipidemia, unspecified: Secondary | ICD-10-CM | POA: Diagnosis present

## 2022-12-04 DIAGNOSIS — Z79899 Other long term (current) drug therapy: Secondary | ICD-10-CM

## 2022-12-04 DIAGNOSIS — I69318 Other symptoms and signs involving cognitive functions following cerebral infarction: Secondary | ICD-10-CM | POA: Diagnosis not present

## 2022-12-04 DIAGNOSIS — D638 Anemia in other chronic diseases classified elsewhere: Secondary | ICD-10-CM | POA: Diagnosis present

## 2022-12-04 DIAGNOSIS — Z6820 Body mass index (BMI) 20.0-20.9, adult: Secondary | ICD-10-CM | POA: Diagnosis not present

## 2022-12-04 DIAGNOSIS — K810 Acute cholecystitis: Secondary | ICD-10-CM | POA: Diagnosis not present

## 2022-12-04 DIAGNOSIS — S065X0D Traumatic subdural hemorrhage without loss of consciousness, subsequent encounter: Secondary | ICD-10-CM | POA: Diagnosis not present

## 2022-12-04 DIAGNOSIS — R1319 Other dysphagia: Secondary | ICD-10-CM | POA: Diagnosis present

## 2022-12-04 DIAGNOSIS — S066X0A Traumatic subarachnoid hemorrhage without loss of consciousness, initial encounter: Secondary | ICD-10-CM | POA: Diagnosis present

## 2022-12-04 DIAGNOSIS — Z8673 Personal history of transient ischemic attack (TIA), and cerebral infarction without residual deficits: Secondary | ICD-10-CM | POA: Diagnosis not present

## 2022-12-04 DIAGNOSIS — F01C18 Vascular dementia, severe, with other behavioral disturbance: Secondary | ICD-10-CM | POA: Diagnosis not present

## 2022-12-04 DIAGNOSIS — E876 Hypokalemia: Secondary | ICD-10-CM | POA: Diagnosis present

## 2022-12-04 DIAGNOSIS — Z7189 Other specified counseling: Secondary | ICD-10-CM

## 2022-12-04 DIAGNOSIS — Z515 Encounter for palliative care: Secondary | ICD-10-CM | POA: Diagnosis not present

## 2022-12-04 DIAGNOSIS — Z9181 History of falling: Secondary | ICD-10-CM

## 2022-12-04 DIAGNOSIS — G9341 Metabolic encephalopathy: Secondary | ICD-10-CM | POA: Diagnosis present

## 2022-12-04 DIAGNOSIS — S06A0XA Traumatic brain compression without herniation, initial encounter: Secondary | ICD-10-CM | POA: Diagnosis present

## 2022-12-04 DIAGNOSIS — R001 Bradycardia, unspecified: Secondary | ICD-10-CM | POA: Diagnosis present

## 2022-12-04 DIAGNOSIS — A419 Sepsis, unspecified organism: Secondary | ICD-10-CM | POA: Diagnosis not present

## 2022-12-04 DIAGNOSIS — F01C0 Vascular dementia, severe, without behavioral disturbance, psychotic disturbance, mood disturbance, and anxiety: Secondary | ICD-10-CM | POA: Diagnosis present

## 2022-12-04 DIAGNOSIS — Z781 Physical restraint status: Secondary | ICD-10-CM

## 2022-12-04 DIAGNOSIS — S065X0S Traumatic subdural hemorrhage without loss of consciousness, sequela: Secondary | ICD-10-CM | POA: Diagnosis not present

## 2022-12-04 DIAGNOSIS — R6520 Severe sepsis without septic shock: Secondary | ICD-10-CM | POA: Diagnosis not present

## 2022-12-04 DIAGNOSIS — R5383 Other fatigue: Secondary | ICD-10-CM | POA: Diagnosis present

## 2022-12-04 DIAGNOSIS — R509 Fever, unspecified: Secondary | ICD-10-CM | POA: Insufficient documentation

## 2022-12-04 DIAGNOSIS — E039 Hypothyroidism, unspecified: Secondary | ICD-10-CM | POA: Diagnosis present

## 2022-12-04 DIAGNOSIS — F17210 Nicotine dependence, cigarettes, uncomplicated: Secondary | ICD-10-CM | POA: Diagnosis present

## 2022-12-04 DIAGNOSIS — K819 Cholecystitis, unspecified: Secondary | ICD-10-CM | POA: Diagnosis not present

## 2022-12-04 DIAGNOSIS — S065XAA Traumatic subdural hemorrhage with loss of consciousness status unknown, initial encounter: Secondary | ICD-10-CM | POA: Diagnosis not present

## 2022-12-04 DIAGNOSIS — W0110XA Fall on same level from slipping, tripping and stumbling with subsequent striking against unspecified object, initial encounter: Secondary | ICD-10-CM | POA: Diagnosis present

## 2022-12-04 DIAGNOSIS — E162 Hypoglycemia, unspecified: Secondary | ICD-10-CM | POA: Diagnosis present

## 2022-12-04 DIAGNOSIS — E43 Unspecified severe protein-calorie malnutrition: Secondary | ICD-10-CM | POA: Diagnosis present

## 2022-12-04 DIAGNOSIS — S066X0S Traumatic subarachnoid hemorrhage without loss of consciousness, sequela: Secondary | ICD-10-CM | POA: Diagnosis not present

## 2022-12-04 DIAGNOSIS — I609 Nontraumatic subarachnoid hemorrhage, unspecified: Secondary | ICD-10-CM | POA: Diagnosis not present

## 2022-12-04 DIAGNOSIS — S065X0A Traumatic subdural hemorrhage without loss of consciousness, initial encounter: Principal | ICD-10-CM | POA: Diagnosis present

## 2022-12-04 DIAGNOSIS — Z79890 Hormone replacement therapy (postmenopausal): Secondary | ICD-10-CM

## 2022-12-04 DIAGNOSIS — R627 Adult failure to thrive: Secondary | ICD-10-CM | POA: Diagnosis present

## 2022-12-04 DIAGNOSIS — S066XAA Traumatic subarachnoid hemorrhage with loss of consciousness status unknown, initial encounter: Secondary | ICD-10-CM

## 2022-12-04 DIAGNOSIS — I10 Essential (primary) hypertension: Secondary | ICD-10-CM | POA: Diagnosis present

## 2022-12-04 DIAGNOSIS — I959 Hypotension, unspecified: Secondary | ICD-10-CM | POA: Diagnosis not present

## 2022-12-04 DIAGNOSIS — Z5309 Procedure and treatment not carried out because of other contraindication: Secondary | ICD-10-CM | POA: Diagnosis not present

## 2022-12-04 DIAGNOSIS — E871 Hypo-osmolality and hyponatremia: Secondary | ICD-10-CM | POA: Diagnosis not present

## 2022-12-04 DIAGNOSIS — F0150 Vascular dementia without behavioral disturbance: Secondary | ICD-10-CM | POA: Diagnosis present

## 2022-12-04 DIAGNOSIS — Z823 Family history of stroke: Secondary | ICD-10-CM

## 2022-12-04 DIAGNOSIS — S0990XA Unspecified injury of head, initial encounter: Secondary | ICD-10-CM

## 2022-12-04 LAB — BASIC METABOLIC PANEL
Anion gap: 10 (ref 5–15)
BUN: 14 mg/dL (ref 8–23)
CO2: 22 mmol/L (ref 22–32)
Calcium: 8.4 mg/dL — ABNORMAL LOW (ref 8.9–10.3)
Chloride: 102 mmol/L (ref 98–111)
Creatinine, Ser: 0.61 mg/dL (ref 0.44–1.00)
GFR, Estimated: >60 mL/min (ref >60)
Glucose, Bld: 98 mg/dL (ref 70–99)
Potassium: 3.8 mmol/L (ref 3.5–5.1)
Sodium: 134 mmol/L — ABNORMAL LOW (ref 135–145)

## 2022-12-04 LAB — CBC WITH DIFFERENTIAL/PLATELET
Abs Immature Granulocytes: 0.02 10*3/uL (ref 0.00–0.07)
Basophils Absolute: 0.1 10*3/uL (ref 0.0–0.1)
Basophils Relative: 1 %
Eosinophils Absolute: 0.2 10*3/uL (ref 0.0–0.5)
Eosinophils Relative: 3 %
HCT: 34.1 % — ABNORMAL LOW (ref 36.0–46.0)
Hemoglobin: 11.1 g/dL — ABNORMAL LOW (ref 12.0–15.0)
Immature Granulocytes: 0 %
Lymphocytes Relative: 39 %
Lymphs Abs: 2.4 10*3/uL (ref 0.7–4.0)
MCH: 30.2 pg (ref 26.0–34.0)
MCHC: 32.6 g/dL (ref 30.0–36.0)
MCV: 92.9 fL (ref 80.0–100.0)
Monocytes Absolute: 0.6 10*3/uL (ref 0.1–1.0)
Monocytes Relative: 10 %
Neutro Abs: 3 10*3/uL (ref 1.7–7.7)
Neutrophils Relative %: 47 %
Platelets: 352 10*3/uL (ref 150–400)
RBC: 3.67 MIL/uL — ABNORMAL LOW (ref 3.87–5.11)
RDW: 12.7 % (ref 11.5–15.5)
WBC: 6.2 10*3/uL (ref 4.0–10.5)
nRBC: 0 % (ref 0.0–0.2)

## 2022-12-04 LAB — TYPE AND SCREEN
ABO/RH(D): A POS
Antibody Screen: NEGATIVE

## 2022-12-04 LAB — TROPONIN I (HIGH SENSITIVITY): Troponin I (High Sensitivity): 4 ng/L (ref <18)

## 2022-12-04 MED ORDER — LOSARTAN POTASSIUM 50 MG PO TABS
100.0000 mg | ORAL_TABLET | Freq: Every day | ORAL | Status: DC
  Administered 2022-12-05: 100 mg via ORAL
  Filled 2022-12-04: qty 2

## 2022-12-04 MED ORDER — LEVOTHYROXINE SODIUM 50 MCG PO TABS
50.0000 ug | ORAL_TABLET | Freq: Every day | ORAL | Status: DC
  Administered 2022-12-05 – 2022-12-10 (×3): 50 ug via ORAL
  Filled 2022-12-04 (×3): qty 1

## 2022-12-04 MED ORDER — ACETAMINOPHEN 650 MG RE SUPP: 650.0000 mg | Freq: Four times a day (QID) | RECTAL | Status: DC | PRN

## 2022-12-04 MED ORDER — SERTRALINE HCL 50 MG PO TABS
50.0000 mg | ORAL_TABLET | Freq: Every day | ORAL | Status: DC
  Administered 2022-12-05: 50 mg via ORAL
  Filled 2022-12-04: qty 1

## 2022-12-04 MED ORDER — ACETAMINOPHEN 325 MG PO TABS: 650.0000 mg | ORAL_TABLET | Freq: Four times a day (QID) | ORAL | Status: DC | PRN

## 2022-12-04 MED ORDER — SODIUM CHLORIDE 0.9% FLUSH
3.0000 mL | Freq: Two times a day (BID) | INTRAVENOUS | Status: DC
  Administered 2022-12-04 – 2022-12-28 (×39): 3 mL via INTRAVENOUS

## 2022-12-04 NOTE — Assessment & Plan Note (Signed)
Continue levothyroxine 

## 2022-12-04 NOTE — ED Triage Notes (Signed)
Pt to ed from home via POV for a fall that happened 11/22. NP came to house today to see her. They scheduled the CT, so they came up here. There wasn't a scheduled order for the CT so they went home. Pt is in wheel chair bc of her limited ambulation skills at home. Pt has HX of dementia. Husband advised she has increasingly gotten worse since the fall on 11/9.

## 2022-12-04 NOTE — Assessment & Plan Note (Signed)
Traumatic subarachnoid hemorrhage History of fall with head strike 2 weeks prior to admission Patient has no focal neurologic deficits Neurologic checks Neurosurgery aware and formally consulted N.p.o. in case of procedure

## 2022-12-04 NOTE — Assessment & Plan Note (Signed)
Continue sertraline Delirium precautions

## 2022-12-04 NOTE — Assessment & Plan Note (Signed)
Hold aspirin

## 2022-12-04 NOTE — ED Provider Notes (Signed)
[facility] Provider Note    Event Date/Time   First MD Initiated Contact with Patient 12/04/22 2051     (approximate)   History   Head Injury   HPI  Christine Hull is a 72 y.o. female who presents to the emergency department today accompanied by family because of concerns for increased lethargy and slowing.  Family states that the patient had a fall 13 days ago.  She did hit her head at that time.  However family was not aware that she had hit her head until the next morning when they noticed a little bit of blood on her pillow.  Since that fall they have noticed the patient has become more lethargic and is unable to accomplish some of the tasks of daily living she was able to prior.  They have noticed increased shuffling of her gait.  When they spoke to their doctor today he recommended obtaining a CT scan of the head.  Additionally family states that she has been grabbing her groin recently. Patient does have history of dementia and is not always able to verbally answer questions per family.     Physical Exam   Triage Vital Signs: ED Triage Vitals  Encounter Vitals Group     BP 12/04/22 2003 (!) 148/97     Systolic BP Percentile --      Diastolic BP Percentile --      Pulse Rate 12/04/22 2003 60     Resp 12/04/22 2003 16     Temp 12/04/22 2003 98.4 F (36.9 C)     Temp src --      SpO2 12/04/22 2003 92 %     Weight --      Height 12/04/22 1958 5\' 5"  (1.651 m)     Head Circumference --      Peak Flow --      Pain Score --      Pain Loc --      Pain Education --      Exclude from Growth Chart --     Most recent vital signs: Vitals:   12/04/22 2003  BP: (!) 148/97  Pulse: 60  Resp: 16  Temp: 98.4 F (36.9 C)  SpO2: 92%   General: Awake, alert, not oriented. CV:  Good peripheral perfusion. Regular rate and rhythm. Resp:  Normal effort. Lungs clear. Abd:  No distention.    ED Results / Procedures / Treatments   Labs (all labs  ordered are listed, but only abnormal results are displayed) Labs Reviewed  CBC WITH DIFFERENTIAL/PLATELET - Abnormal; Notable for the following components:      Result Value   RBC 3.67 (*)    Hemoglobin 11.1 (*)    HCT 34.1 (*)    All other components within normal limits  BASIC METABOLIC PANEL - Abnormal; Notable for the following components:   Sodium 134 (*)    Calcium 8.4 (*)    All other components within normal limits  URINALYSIS, ROUTINE W REFLEX MICROSCOPIC  TYPE AND SCREEN  TROPONIN I (HIGH SENSITIVITY)     EKG  None   RADIOLOGY I independently interpreted and visualized the CT head. My interpretation: bilateral subdural hematoma Radiology interpretation:  IMPRESSION:  1. Bilateral holo hemispheric subdural hematomas (likely acute on  subacute), right greater than left. There is resultant 4 mm of  midline shift to the left.  2. Likely small amount of subarachnoid hemorrhage in the left  temporal lobe in the middle  cranial fossa.      PROCEDURES:  Critical Care performed: Yes  CRITICAL CARE Performed by: Phineas Semen   Total critical care time: 30 minutes  Critical care time was exclusive of separately billable procedures and treating other patients.  Critical care was necessary to treat or prevent imminent or life-threatening deterioration.  Critical care was time spent personally by me on the following activities: development of treatment plan with patient and/or surrogate as well as nursing, discussions with consultants, evaluation of patient's response to treatment, examination of patient, obtaining history from patient or surrogate, ordering and performing treatments and interventions, ordering and review of laboratory studies, ordering and review of radiographic studies, pulse oximetry and re-evaluation of patient's condition.   Procedures    MEDICATIONS ORDERED IN ED: Medications - No data to display   IMPRESSION / MDM / ASSESSMENT AND  PLAN / ED COURSE  I reviewed the triage vital signs and the nursing notes.                              Differential diagnosis includes, but is not limited to, ICH, fracture, anemia, infection  Patient's presentation is most consistent with acute presentation with potential threat to life or bodily function.   The patient is on the cardiac monitor to evaluate for evidence of arrhythmia and/or significant heart rate changes.  Patient presented to the emergency department today because of concerns for increased lethargy and weakness after a fall roughly 2 weeks ago.  On exam patient is awake and alert.  Appears to be moving all extremities symmetrically.  Patient does have history of dementia so cannot give any significant history.  Head CT was concerning for bilateral subdural hematoma with 4 mm of shift.  Did discuss with Dr. Alta Corning with neurosurgery who evaluated the patient's imaging.  At this time he did not feel any emergent intervention was required.  He states he will evaluate patient and talk to family tomorrow about possibility of draining the subdural hematomas.  Blood work without significant anemia or electrolyte abnormality.  Discussed with Dr. Para March with the hospitalist service who will evaluate for admission.      FINAL CLINICAL IMPRESSION(S) / ED DIAGNOSES   Final diagnoses:  Lethargy  SDH (subdural hematoma) (HCC)     Note:  This document was prepared using Dragon voice recognition software and may include unintentional dictation errors.    Phineas Semen, MD 12/04/22 2252

## 2022-12-04 NOTE — ED Notes (Signed)
Pt uncooperative in triage for blood.

## 2022-12-04 NOTE — Assessment & Plan Note (Signed)
Continued nadolol and losartan

## 2022-12-04 NOTE — ED Notes (Signed)
This RN attempted IV and pt uncooperative.

## 2022-12-04 NOTE — Assessment & Plan Note (Signed)
Secondary to traumatic subdural and subarachnoid hemorrhage.  Urinalysis has too many squamous epithelial cells, urine culture not warranted.

## 2022-12-04 NOTE — H&P (Incomplete)
History and Physical    Patient: Christine Hull ZOX:[id number] DOB: 1950-11-20 DOA: 12/04/2022 DOS: the patient was seen and examined on 12/04/2022 PCP: Annita Brod, MD  Patient coming from: Home  Chief Complaint:  Chief Complaint  Patient presents with   Head Injury    HPI: CANDA SLAPPEY is a 72 y.o. female with medical history significant for Hypothyroidism, hypertension, prior stroke with vascular dementia, brought into the ED due to concerns for a 2-week history of shuffling gait, increased somnolence and decreased ability to participate in ADLs like feeding herself.  Symptoms started following an unwitnessed fall with head strike 2 weeks prior.  History is provided by husband at bedside.  On the day after her fall, patient was noted to have a skin tear on her occipital scalp with a little bleeding.  Over the course of the next 2 weeks, she started declining.  They went to the PCP who directed them to the ED for evaluation ED Course and data review: BP 148/97 with pulse 51 and otherwise normal vitals Labs notable for hemoglobin of 11.1 which appears to be her baseline Troponin 4 UA pending  CT head showing subdural and subarachnoid hemorrhages as follows: IMPRESSION: 1. Bilateral holo hemispheric subdural hematomas (likely acute on subacute), right greater than left. There is resultant 4 mm of midline shift to the left. 2. Likely small amount of subarachnoid hemorrhage in the left temporal lobe in the middle cranial fossa.  CT C-spine nonacute The ED provider spoke with neurosurgeon, Dr. Alta Corning who will see patient in the a.m. and possibly take to the OR. Hospitalist consulted for admission.     Past Medical History:  Diagnosis Date   Dementia (HCC)    Heart murmur    Hypertension    Hypothyroidism    [employer] LINQ 05/09/2013   Near syncope 02/02/2013   Prolonged QT interval 02/02/2013   Sinus bradycardia 05/09/2013   Past Surgical History:  Procedure  Laterality Date   AUGMENTATION MAMMAPLASTY Bilateral 1990's   "had implants put in; had them taken out 6 months later" (02/02/2013)   BREAST BIOPSY Right 2011   Sankar,  normal   LOOP RECORDER IMPLANT N/A 02/03/2013   Procedure: LOOP RECORDER IMPLANT;  Surgeon: Duke Salvia, MD;  Location: [facility] CATH LAB;  Service: Cardiovascular;  Laterality: N/A;   Social History:  reports that she has been smoking cigarettes. She has a 20.5 pack-year smoking history. She has never used smokeless tobacco. She reports current alcohol use of about 5.0 standard drinks of alcohol per week. She reports that she does not use drugs.  No Known Allergies  Family History  Problem Relation Age of Onset   Early death Mother        stroke resulting from prior head injury   Stroke Mother    Early death Father        MVA   Cancer Maternal Aunt        stomach cancer   Breast cancer Neg Hx     Prior to Admission medications   Medication Sig Start Date End Date Taking? Authorizing Provider  fish oil-omega-3 fatty acids 1000 MG capsule Take 1 g by mouth daily.     [provider]  liothyronine (CYTOMEL) 5 MCG tablet Take 1 tablet (5 mcg total) by mouth daily. 02/19/17   Sherlene Shams, MD  liothyronine (CYTOMEL) 5 MCG tablet TAKE 1 TABLET DAILY (PLEASE CONTACT OFFICE FOR A FOLLOW UP FOR FURTHER REFILLS) 04/23/17  Sherlene Shams, MD  liothyronine (CYTOMEL) 5 MCG tablet TAKE 1 TABLET DAILY (PLEASE CONTACT OFFICE FOR A FOLLOW UP FOR FURTHER REFILLS. LAST REFILL WITHOUT OFFICE VISIT) 12/14/18   Sherlene Shams, MD  liver oil-zinc oxide (DESITIN) 40 % ointment Apply 1 application topically as needed for irritation. 02/16/21   Gailen Shelter, PA  losartan (COZAAR) 100 MG tablet Take 1 tablet (100 mg total) by mouth daily. 02/19/17 05/20/17  Sherlene Shams, MD  nadolol (CORGARD) 20 MG tablet TAKE 1 TABLET TWICE A DAY (NEED OFFICE VISIT FOR FURTHER REFILLS) 07/26/17   Sherlene Shams, MD  nicotine (NICODERM CQ) 14 mg/24hr  patch Place 1 patch (14 mg total) onto the skin daily. 02/19/17   Sherlene Shams, MD  nystatin cream (MYCOSTATIN) Apply to affected area 2 times daily 02/16/21   Gailen Shelter, PA  sertraline (ZOLOFT) 50 MG tablet TAKE 1 TABLET DAILY 08/22/19   Sherlene Shams, MD  SYNTHROID 50 MCG tablet TAKE 1 TABLET DAILY BEFORE BREAKFAST (PLEASE CONTACT OFFICE FOR A FOLLOW UP FOR FURTHER REFILLS) 04/12/17   Sherlene Shams, MD    Physical Exam: Vitals:   12/04/22 1958 12/04/22 2003 12/04/22 2057 12/04/22 2145  BP:  (!) 148/97    Pulse:  60 (!) 51 (!) 51  Resp:  16 13 12   Temp:  98.4 F (36.9 C)    SpO2:  92% 100% 100%  Height: 5\' 5"  (1.651 m)      Physical Exam Vitals and nursing note reviewed.  Constitutional:      General: She is sleeping. She is not in acute distress. HENT:     Head: Normocephalic and atraumatic.  Cardiovascular:     Rate and Rhythm: Normal rate and regular rhythm.     Heart sounds: Normal heart sounds.  Pulmonary:     Effort: Pulmonary effort is normal.     Breath sounds: Normal breath sounds.  Abdominal:     Palpations: Abdomen is soft.     Tenderness: There is no abdominal tenderness.  Neurological:     Mental Status: She is lethargic.     Comments: No focal neurologic deficit     Labs on Admission: I have personally reviewed following labs and imaging studies  CBC: Recent Labs  Lab 12/04/22 2125  WBC 6.2  NEUTROABS 3.0  HGB 11.1*  HCT 34.1*  MCV 92.9  PLT 352   Basic Metabolic Panel: Recent Labs  Lab 12/04/22 2125  NA 134*  K 3.8  CL 102  CO2 22  GLUCOSE 98  BUN 14  CREATININE 0.61  CALCIUM 8.4*   GFR: CrCl cannot be calculated (Unknown ideal weight.). Liver Function Tests: No results for input(s): "AST", "ALT", "ALKPHOS", "BILITOT", "PROT", "ALBUMIN" in the last 168 hours. No results for input(s): "LIPASE", "AMYLASE" in the last 168 hours. No results for input(s): "AMMONIA" in the last 168 hours. Coagulation Profile: No results for  input(s): "INR", "PROTIME" in the last 168 hours. Cardiac Enzymes: No results for input(s): "CKTOTAL", "CKMB", "CKMBINDEX", "TROPONINI" in the last 168 hours. BNP (last 3 results) No results for input(s): "PROBNP" in the last 8760 hours. HbA1C: No results for input(s): "HGBA1C" in the last 72 hours. CBG: No results for input(s): "GLUCAP" in the last 168 hours. Lipid Profile: No results for input(s): "CHOL", "HDL", "LDLCALC", "TRIG", "CHOLHDL", "LDLDIRECT" in the last 72 hours. Thyroid Function Tests: No results for input(s): "TSH", "T4TOTAL", "FREET4", "T3FREE", "THYROIDAB" in the last 72 hours. Anemia Panel:  No results for input(s): "VITAMINB12", "FOLATE", "FERRITIN", "TIBC", "IRON", "RETICCTPCT" in the last 72 hours. Urine analysis:    Component Value Date/Time   COLORURINE YELLOW 11/11/2013 1420   APPEARANCEUR CLEAR 11/11/2013 1420   LABSPEC 1.005 11/11/2013 1420   PHURINE 7.0 11/11/2013 1420   GLUCOSEU NEGATIVE 11/11/2013 1420   HGBUR NEGATIVE 11/11/2013 1420   BILIRUBINUR NEGATIVE 11/11/2013 1420   KETONESUR NEGATIVE 11/11/2013 1420   PROTEINUR NEGATIVE 11/11/2013 1420   UROBILINOGEN 0.2 11/11/2013 1420   NITRITE NEGATIVE 11/11/2013 1420   LEUKOCYTESUR NEGATIVE 11/11/2013 1420    Radiological Exams on Admission: CT Cervical Spine Wo Contrast  Result Date: 12/04/2022 CLINICAL DATA:  Neck trauma (Age >= 65y) EXAM: CT CERVICAL SPINE WITHOUT CONTRAST TECHNIQUE: Multidetector CT imaging of the cervical spine was performed without intravenous contrast. Multiplanar CT image reconstructions were also generated. RADIATION DOSE REDUCTION: This exam was performed according to the departmental dose-optimization program which includes automated exposure control, adjustment of the mA and/or kV according to patient size and/or use of iterative reconstruction technique. COMPARISON:  None Available. FINDINGS: Alignment: Straightening of normal lordosis. No traumatic subluxation. Skull base  and vertebrae: No acute fracture. Vertebral body heights are maintained. The dens and skull base are intact. Soft tissues and spinal canal: No prevertebral fluid or swelling. No visible canal hematoma. Disc levels: Mild diffuse disc space narrowing and spurring. No high-grade canal stenosis. Upper chest: No acute findings. Other: None. IMPRESSION: Mild degenerative change. No acute fracture or subluxation of the cervical spine. Electronically Signed   By: Narda Rutherford M.D.   On: 12/04/2022 21:01   CT Head Wo Contrast  Result Date: 12/04/2022 CLINICAL DATA:  Trauma EXAM: CT HEAD WITHOUT CONTRAST TECHNIQUE: Contiguous axial images were obtained from the base of the skull through the vertex without intravenous contrast. RADIATION DOSE REDUCTION: This exam was performed according to the departmental dose-optimization program which includes automated exposure control, adjustment of the mA and/or kV according to patient size and/or use of iterative reconstruction technique. COMPARISON:  MRI brain 07/17/2016.  CT head 11/12/2014. FINDINGS: Brain: There is right-sided holo hemispheric subdural hematoma measuring up to 17 mm in thickness. This is intermediate density. There is also left-sided holo hemispheric subdural hematoma measuring up to 10 mm which is predominantly intermediate density with a small amount of hyperdensity seen. There are additional very small areas of hyperdense acute subdural hematoma overlying the falx measuring 3 mm or left predominantly anteriorly. There is resultant 4 mm of midline shift to the left. There is likely a small amount of subarachnoid hemorrhage in the left temporal lobe in the middle cranial fossa. There is no hydrocephalus or acute infarct. Gray-white matter distinction is preserved. Vascular: No hyperdense vessel or unexpected calcification. Skull: Normal. Negative for fracture or focal lesion. Sinuses/Orbits: No acute finding. Other: None. IMPRESSION: 1. Bilateral holo  hemispheric subdural hematomas (likely acute on subacute), right greater than left. There is resultant 4 mm of midline shift to the left. 2. Likely small amount of subarachnoid hemorrhage in the left temporal lobe in the middle cranial fossa. These results were called by telephone at the time of interpretation on 12/04/2022 at 8:32 pm to provider [facility] , who verbally acknowledged these results. Electronically Signed   By: Darliss Cheney M.D.   On: 12/04/2022 20:33     Data Reviewed: Relevant notes from primary care and specialist visits, past discharge summaries as available in EHR, including Care Everywhere. Prior diagnostic testing as pertinent to current admission diagnoses Updated  medications and problem lists for reconciliation ED course, including vitals, labs, imaging, treatment and response to treatment Triage notes, nursing and pharmacy notes and ED provider's notes Notable results as noted in HPI   Assessment and Plan: * Traumatic subdural hematoma (HCC) Traumatic subarachnoid hemorrhage History of fall with head strike 2 weeks prior to admission Patient has no focal neurologic deficits Neurologic checks Neurosurgery aware and formally consulted N.p.o. in case of procedure  Acute metabolic encephalopathy Secondary to traumatic subdural and subarachnoid hemorrhage UA ordered from the ED to evaluate for UTI that still pending Neurologic checks Fall and aspiration precautions  History of CVA (cerebrovascular accident) Hold aspirin  Essential hypertension Continued nadolol and losartan  VAD (vascular dementia) (HCC) Continue sertraline Delirium precautions   Hypothyroidism Continue liothyronine    DVT prophylaxis: SCD  Consults: Neurosurgery, Dr. Alta Corning  Advance Care Planning:   Code Status: Prior   Family Communication: Husband at bedside  Disposition Plan: Back to previous home environment  Severity of Illness: The appropriate patient status  for this patient is INPATIENT. Inpatient status is judged to be reasonable and necessary in order to provide the required intensity of service to ensure the patient's safety. The patient's presenting symptoms, physical exam findings, and initial radiographic and laboratory data in the context of their chronic comorbidities is felt to place them at high risk for further clinical deterioration. Furthermore, it is not anticipated that the patient will be medically stable for discharge from the hospital within 2 midnights of admission.   * I certify that at the point of admission it is my clinical judgment that the patient will require inpatient hospital care spanning beyond 2 midnights from the point of admission due to high intensity of service, high risk for further deterioration and high frequency of surveillance required.*  Author: Andris Baumann, MD 12/04/2022 10:54 PM  For on call review [email or web address].

## 2022-12-05 DIAGNOSIS — F01C0 Vascular dementia, severe, without behavioral disturbance, psychotic disturbance, mood disturbance, and anxiety: Secondary | ICD-10-CM

## 2022-12-05 DIAGNOSIS — I10 Essential (primary) hypertension: Secondary | ICD-10-CM

## 2022-12-05 DIAGNOSIS — G9341 Metabolic encephalopathy: Secondary | ICD-10-CM

## 2022-12-05 DIAGNOSIS — S065X0S Traumatic subdural hemorrhage without loss of consciousness, sequela: Secondary | ICD-10-CM | POA: Diagnosis not present

## 2022-12-05 DIAGNOSIS — S065XAA Traumatic subdural hemorrhage with loss of consciousness status unknown, initial encounter: Secondary | ICD-10-CM

## 2022-12-05 DIAGNOSIS — Z8673 Personal history of transient ischemic attack (TIA), and cerebral infarction without residual deficits: Secondary | ICD-10-CM

## 2022-12-05 DIAGNOSIS — E039 Hypothyroidism, unspecified: Secondary | ICD-10-CM

## 2022-12-05 LAB — URINALYSIS, ROUTINE W REFLEX MICROSCOPIC
Bilirubin Urine: NEGATIVE
Glucose, UA: NEGATIVE mg/dL
Ketones, ur: NEGATIVE mg/dL
Nitrite: NEGATIVE
Protein, ur: 30 mg/dL — AB
Specific Gravity, Urine: 1.02 (ref 1.005–1.030)
Squamous Epithelial / HPF: >50 /[HPF] (ref 0–5)
WBC, UA: >50 WBC/hpf (ref 0–5)
pH: 7 (ref 5.0–8.0)

## 2022-12-05 LAB — CBG MONITORING, ED
Glucose-Capillary: 79 mg/dL (ref 70–99)
Glucose-Capillary: 95 mg/dL (ref 70–99)

## 2022-12-05 MED ORDER — DEXTROSE-SODIUM CHLORIDE 5-0.9 % IV SOLN: INTRAVENOUS | Status: AC

## 2022-12-05 NOTE — Progress Notes (Signed)
Progress Note   Patient: Christine Hull UJW:[id number] DOB: [birth year] DOA: 12/04/2022     1 DOS: the patient was seen and examined on 12/05/2022   Brief hospital course: 72 year old female with past medical history of hypothyroidism, hypertension, prior stroke with vascular dementia brought into the ED after decreased mental status and not walking as much.  She had a fall around a week ago.  In the ER found to have bilateral subdural hematomas with resultant 4 mm midline shift to the left and possible subarachnoid hemorrhage.  11/23.  Family to decide on whether or not they want to do conservative management or neurosurgical intervention.  Assessment and Plan: * Traumatic subdural hematoma without loss of consciousness (HCC) Traumatic subarachnoid hemorrhage History of fall with head strike 2 weeks prior to admission Case discussed with neurosurgery and family will decide on how they want to proceed conservative management versus neurosurgical intervention.  Will let the patient eat today and keep n.p.o. after midnight.  Will get swallow evaluation.  Acute metabolic encephalopathy Secondary to traumatic subdural and subarachnoid hemorrhage.  Urinalysis has too many squamous epithelial cells, urine culture not warranted.   History of CVA (cerebrovascular accident) Hold aspirin  Essential hypertension Continued losartan  VAD (vascular dementia) (HCC) Continue sertraline Delirium precautions   Hypothyroidism Continue levothyroxine        Subjective: History obtained by husband with the patient having dementia.  Patient had had a fall about a week ago.  Now unable to walk much.  Found to have bilateral subdural hematoma with midline shift to the left.  Also subarachnoid hemorrhage.  Physical Exam: Vitals:   12/05/22 0611 12/05/22 0800 12/05/22 0900 12/05/22 1100  BP: 121/87 117/82 (!) 140/100 123/71  Pulse: (!) 50 (!) 55 (!) 52 85  Resp: 12 20 18 19   Temp: 97.9 F  (36.6 C)     TempSrc: Oral     SpO2: 100% 100% 100% 97%  Height:       Physical Exam HENT:     Head: Normocephalic.     Mouth/Throat:     Comments: Did not open mouth for me. Eyes:     General: Lids are normal.     Conjunctiva/sclera: Conjunctivae normal.  Cardiovascular:     Rate and Rhythm: Normal rate and regular rhythm.     Heart sounds: Normal heart sounds, S1 normal and S2 normal.  Pulmonary:     Breath sounds: No decreased breath sounds, wheezing, rhonchi or rales.  Abdominal:     Palpations: Abdomen is soft.     Tenderness: There is no abdominal tenderness.  Musculoskeletal:     Right lower leg: No swelling.     Left lower leg: No swelling.  Neurological:     Mental Status: She is alert.     Comments: Patient send thank you after I was completed.  Otherwise hard to understand what she was saying.  Patient moved her extremities on her own     Data Reviewed: CT scan of the head showed bilateral subdural hematomas right greater than left with resultant 4 mm midline shift to the left, likely small amount of subarachnoid hemorrhage in the left temporal lobe Sodium 134 creatinine 0.61, white blood cell count 6.2, hemoglobin 11.1, platelet count 352  Family Communication: Spoke with husband at the bedside  Disposition: Status is: Inpatient Remains inpatient appropriate because: Family to decide on whether or not they want to do surgical management versus conservative management. Planned Discharge Destination: To be determined  Time spent: 27 minutes Case discussed with neurosurgery.   Author: Alford Highland, MD 12/05/2022 1:12 PM  For on call review [email or web address].

## 2022-12-05 NOTE — Consult Note (Signed)
NEUROSURGERY CONSULT NOTE   Assessment and Recommendations Christine Hull is a 72 y.o. 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  Discussed the risks, indications (size, mass effect, midline shift, and symptoms likely in relation to SDH), and alternatives to surgical intervention (conservative), with surgery specifically being bilateral burr holes for drainage of SDH.  The risks include hematoma, infection, CSF leak, wound dehiscence, repeat surgery/recurrence, seizures, neurological injury, stroke, coma, death.  Due to the patients significant dementia and poor KPS, patient's husband would like to discuss with the entire family if they should proceed with surgery versus pursue conservative measures.    -Please make patient NPO at midnight in case family would like to proceed forward with intervention -Obtain UA to evaluate for UTI as source of patients depressed mental status  I personally spent 90 minutes face-to-face and non-face-to-face in the care of this patient, which includes all pre, intra, and post visit time on the date of service.  All documented time was specific to E/M visit and does not include any procedures that may have been performed  Encounter diagnoses Lethargy  SDH (subdural hematoma) (HCC)  History of present illness Christine Hull is a 72 y.o. female with PMH of Hypothyroidism, hypertension, prior stroke with vascular dementia who was brought into the ED yesterday found to have a SDH prompting neurosurgery consultation.  Per husband at bedside, patient was in her normal state of health when she had an unwitnessed fall roughly 2 weeks ago.  Since then she has had a slow decline from her baseline.  Of important note, husband does report patient lives at home but due to advanced dementia is 100% dependent on caregiver for ADLs.  Reports previously "needs to be fed but occasionally will do it herself" "occasionally will answer simple questions."  Over the  past 1-2 weeks, husband has noticed that the patient is less bright, communicative, and has more difficulties with ambulation.  Denies any specific unilateral weakness.  Denies seizure episodes.  Is not on any anticoagulation/antiplt.   Husband does report incontinent at baseline but has noticed more irritation to perineal region and patient "scratching frequently." Denies any fevers or foul smelling urine.  Physical Exam  Vital Signs:  BP 123/71   Pulse 85   Temp 97.9 F (36.6 C) (Oral)   Resp 19   Ht 5\' 5"  (1.651 m)   SpO2 97%   BMI 20.80 kg/m  General: No acute distress; Body mass index is 20.8 kg/m.  Neurological Exam: Does not respond to AO questioning Does not regard examiner Does not respond to simple yes/no questioning Cranial nerves grossly intact Does not follow commands Appears grossly symmetric and resists examiner with good/equal strength bilaterally  Neurological imaging reviewed CT head: bilateral chronic SDH Rt>Lt with 4 mm of midline shift.  Small area of acute hemorrhage on the left parietal region  ---Historical data---  Allergies Patient has no known allergies.  Medications   Medications reviewed in Epic.  Past Medical History Past Medical History:  Diagnosis Date   Dementia (HCC)    Heart murmur    Hypertension    Hypothyroidism    Medtronic LINQ 05/09/2013   Near syncope 02/02/2013   Prolonged QT interval 02/02/2013   Sinus bradycardia 05/09/2013    Past Surgical History Past Surgical History:  Procedure Laterality Date   AUGMENTATION MAMMAPLASTY Bilateral 1990's   "had implants put in; had them taken out 6 months later" (02/02/2013)   BREAST BIOPSY Right 2011   Evette Cristal,  normal   LOOP RECORDER IMPLANT N/A 02/03/2013   Procedure: LOOP RECORDER IMPLANT;  Surgeon: Duke Salvia, MD;  Location: [facility] CATH LAB;  Service: Cardiovascular;  Laterality: N/A;    Family History Family History  Problem Relation Age of Onset   Early death Mother         stroke resulting from prior head injury   Stroke Mother    Early death Father        MVA   Cancer Maternal Aunt        stomach cancer   Breast cancer Neg Hx     Social History Social History   Tobacco Use   Smoking status: Every Day    Current packs/day: 0.50    Average packs/day: 0.5 packs/day for 41.0 years (20.5 ttl pk-yrs)    Types: Cigarettes   Smokeless tobacco: Never  Substance Use Topics   Alcohol use: Yes    Alcohol/week: 5.0 standard drinks of alcohol    Types: 5 Shots of liquor per week    Comment: 02/02/2013 "drink a shot  in my coffee ~ nightly before bed"   Drug use: No

## 2022-12-05 NOTE — Hospital Course (Addendum)
72 year old female with past medical history of hypothyroidism, hypertension, prior stroke with vascular dementia brought into the ED after decreased mental status and not walking as much.  She had a fall around a week ago.  In the ER found to have bilateral subdural hematomas with resultant 4 mm midline shift to the left and possible subarachnoid hemorrhage.  Bilateral burr hole procedure done by neurosurgery on 11/25.   11/26. Decrease in size of bilateral subdural collections 5 mm on the right (was 1.5 cm previously), 6 mm on the left (was previously 1.1 cm).  Resolution of midline shift. 11/27.  Patient is evaluated by speech therapy, still not safe to for oral intake.  Place NG tube start tube feeding. 11/29.  Neurosurgery has removed the drain. 11/30.  Patient developed a high fever, CT abdomen showed acalculous cholecystitis.  Started Zosyn. 12/3.  Leukocytosis essentially resolved, restart tube feeding at two thirds of required rate. Since then, patient has been tolerating tube feeding, antibiotics is completed.  PEG tube is placed by GI 12/12.  Patient is medically stable for discharge, pending nursing home placement.

## 2022-12-05 NOTE — ED Notes (Signed)
Provided perineal care, changed pt's incontinent pad. Pt tolerated well. Pt's underwear given to pt's husband in pt's belonging bag

## 2022-12-05 NOTE — Progress Notes (Signed)
PT Cancellation Note  Patient Details Name: NAUDIA GALE MRN: [id number] DOB: [birth year]   Cancelled Treatment:    Reason Eval/Treat Not Completed: Other (comment). Consult received and chart reviewed. Per chart, pt pending neuro surgical consult. Will hold PT until medically cleared.   Vondra Aldredge 12/05/2022, 8:15 AM Elizabeth Palau, PT, DPT, GCS [phone]

## 2022-12-06 DIAGNOSIS — S065X0S Traumatic subdural hemorrhage without loss of consciousness, sequela: Secondary | ICD-10-CM | POA: Diagnosis not present

## 2022-12-06 DIAGNOSIS — G9341 Metabolic encephalopathy: Secondary | ICD-10-CM | POA: Diagnosis not present

## 2022-12-06 DIAGNOSIS — Z8673 Personal history of transient ischemic attack (TIA), and cerebral infarction without residual deficits: Secondary | ICD-10-CM | POA: Diagnosis not present

## 2022-12-06 DIAGNOSIS — I10 Essential (primary) hypertension: Secondary | ICD-10-CM | POA: Diagnosis not present

## 2022-12-06 LAB — CBC
HCT: 31.4 % — ABNORMAL LOW (ref 36.0–46.0)
Hemoglobin: 10.5 g/dL — ABNORMAL LOW (ref 12.0–15.0)
MCH: 29.8 pg (ref 26.0–34.0)
MCHC: 33.4 g/dL (ref 30.0–36.0)
MCV: 89.2 fL (ref 80.0–100.0)
Platelets: 391 10*3/uL (ref 150–400)
RBC: 3.52 MIL/uL — ABNORMAL LOW (ref 3.87–5.11)
RDW: 12.4 % (ref 11.5–15.5)
WBC: 5.8 10*3/uL (ref 4.0–10.5)
nRBC: 0 % (ref 0.0–0.2)

## 2022-12-06 LAB — BASIC METABOLIC PANEL
Anion gap: 8 (ref 5–15)
BUN: 10 mg/dL (ref 8–23)
CO2: 26 mmol/L (ref 22–32)
Calcium: 8.5 mg/dL — ABNORMAL LOW (ref 8.9–10.3)
Chloride: 103 mmol/L (ref 98–111)
Creatinine, Ser: 0.69 mg/dL (ref 0.44–1.00)
GFR, Estimated: >60 mL/min (ref >60)
Glucose, Bld: 116 mg/dL — ABNORMAL HIGH (ref 70–99)
Potassium: 3.5 mmol/L (ref 3.5–5.1)
Sodium: 137 mmol/L (ref 135–145)

## 2022-12-06 LAB — GLUCOSE, CAPILLARY: Glucose-Capillary: 118 mg/dL — ABNORMAL HIGH (ref 70–99)

## 2022-12-06 MED ORDER — DEXTROSE-SODIUM CHLORIDE 5-0.9 % IV SOLN
INTRAVENOUS | Status: DC
Start: 2022-12-06 — End: 2022-12-07

## 2022-12-06 MED ORDER — LOSARTAN POTASSIUM 50 MG PO TABS: 50.0000 mg | ORAL_TABLET | Freq: Every day | ORAL | Status: DC

## 2022-12-06 NOTE — Progress Notes (Signed)
PT Cancellation Note  Patient Details Name: KELSIE STOVES MRN: [id number] DOB: 1950/10/18   Cancelled Treatment:    Reason Eval/Treat Not Completed: Patient's level of consciousness. Attempted to work with patient, she will open eyes, but does not speak nor follow any command. Will continue to follow. Patient/family are deciding on procedure or not. Evaluate when/if appropriate.      Danelle Curiale 12/06/2022, 9:12 AM

## 2022-12-06 NOTE — Progress Notes (Signed)
OT Cancellation Note  Patient Details Name: AERIONNA WAITHE MRN: [id number] DOB: 1951-01-12   Cancelled Treatment:    Reason Eval/Treat Not Completed: Other (comment) (chart reviewed, pt is potentially pending neurosurgical intervention, per chart family to discuss. OT will hold at this time, reattempt as able.Oleta Mouse, OTD OTR/L  12/06/22, 10:13 AM

## 2022-12-06 NOTE — Evaluation (Signed)
Clinical/Bedside Swallow Evaluation Patient Details  Name: Christine Hull MRN: [id number] Date of Birth: Dec 09, 1950  Today's Date: 12/06/2022 Time: SLP Start Time (ACUTE ONLY): 0945 SLP Stop Time (ACUTE ONLY): 0955 SLP Time Calculation (min) (ACUTE ONLY): 10 min  Past Medical History:  Past Medical History:  Diagnosis Date   Dementia (HCC)    Heart murmur    Hypertension    Hypothyroidism    Medtronic LINQ 05/09/2013   Near syncope 02/02/2013   Prolonged QT interval 02/02/2013   Sinus bradycardia 05/09/2013   Past Surgical History:  Past Surgical History:  Procedure Laterality Date   AUGMENTATION MAMMAPLASTY Bilateral 1990's   "had implants put in; had them taken out 6 months later" (02/02/2013)   BREAST BIOPSY Right 2011   Sankar,  normal   LOOP RECORDER IMPLANT N/A 02/03/2013   Procedure: LOOP RECORDER IMPLANT;  Surgeon: Duke Salvia, MD;  Location: [facility] CATH LAB;  Service: Cardiovascular;  Laterality: N/A;   HPI:  Christine Hull is a 72 y.o. female with who presented to ED following 2-week hx of shuffling gait, increased somnolence, and decreased ability to participate in ADLs. Symptoms started following an unwitnessed fall with head strike. Pt medical history significant for hypothyroidism, hypertension, prior stroke with vascular dementia. Head CT, "1. Bilateral holo hemispheric subdural hematomas (likely acute on  subacute), right greater than left. There is resultant 4 mm of  midline shift to the left.  2. Likely small amount of subarachnoid hemorrhage in the left  temporal lobe in the middle cranial fossa."    Assessment / Plan / Recommendation  Clinical Impression  Pt seen for clinical swallowing evaluation. Noted Dr. Renae Gloss initiated pt on Dysphagia 3 diet this AM. Per RN, pt has not received POs despite diet order. Pt alert, opens eyes. Non-verbal. Does not follow commands. No obvious response to oral care attempts or POs despite verbal/tactile stimulation. Pt with  poor awareness of oral stimulation. Pharyngeal swallow could not be assessed at this time. Recommend NPO with continued oral care attempts. SLP to f/u per POC for clinical swallowing re-evaluation.   SLP Visit Diagnosis: Dysphagia, oral phase (R13.11)    Aspiration Risk  Severe aspiration risk;Risk for inadequate nutrition/hydration    Diet Recommendation NPO    Medication Administration: Via alternative means        Recommendations for follow up therapy are one component of a multi-disciplinary discharge planning process, led by the attending physician.  Recommendations may be updated based on patient status, additional functional criteria and insurance authorization.        Functional Status Assessment Patient has had a recent decline in their functional status and demonstrates the ability to make significant improvements in function in a reasonable and predictable amount of time.  Frequency and Duration min 2x/week  2 weeks       Prognosis Prognosis for improved oropharyngeal function: Guarded Barriers to Reach Goals: Cognitive deficits;Severity of deficits;Behavior      Swallow Study   General Date of Onset: 12/04/22 (admit date) HPI: Christine Hull is a 72 y.o. female with who presented to ED following 2-week hx of shuffling gait, increased somnolence, and decreased ability to participate in ADLs. Symptoms started following an unwitnessed fall with head strike. Pt medical history significant for hypothyroidism, hypertension, prior stroke with vascular dementia. Head CT, "1. Bilateral holo hemispheric subdural hematomas (likely acute on  subacute), right greater than left. There is resultant 4 mm of  midline shift to the left.  2.  Likely small amount of subarachnoid hemorrhage in the left  temporal lobe in the middle cranial fossa." Type of Study: Bedside Swallow Evaluation Previous Swallow Assessment: none Diet Prior to this Study: Dysphagia 3 (mechanical soft);Thin liquids  (Level 0) Temperature Spikes Noted: Yes Respiratory Status: Room air History of Recent Intubation: No Behavior/Cognition: Alert;Doesn't follow directions Oral Cavity Assessment:  (UTA) Oral Care Completed by SLP: No Oral Cavity - Dentition:  (UTA) Vision:  (UTA) Self-Feeding Abilities: Refused PO Patient Positioning: Upright in bed Baseline Vocal Quality:  (UTA) Volitional Cough:  (UTA) Volitional Swallow:  (UTA)    Oral/Motor/Sensory Function Overall Oral Motor/Sensory Function: Within functional limits   Ice Chips Ice chips: Not tested   Thin Liquid Thin Liquid: Impaired Presentation: Spoon;Cup;Straw Oral Phase Impairments: Poor awareness of bolus Oral Phase Functional Implications:  (did not open mouth to retrieve) Pharyngeal  Phase Impairments:  (UTA)    Nectar Thick Nectar Thick Liquid: Not tested   Honey Thick Honey Thick Liquid: Not tested   Puree Puree: Impaired Presentation: Spoon Oral Phase Impairments: Poor awareness of bolus Oral Phase Functional Implications:  (pursed lips; did not accept bolus) Pharyngeal Phase Impairments:  (UTA)   Solid     Solid: Not tested     Clyde Canterbury, M.S., CCC-SLP Speech-Language Pathologist [facility] [phone] (ASCOM)  Alessandra Bevels Dezarae Mcclaran 12/06/2022,11:12 AM

## 2022-12-06 NOTE — Progress Notes (Signed)
Progress Note   Patient: Christine Hull KGM:[id number] DOB: 1950-06-18 DOA: 12/04/2022     2 DOS: the patient was seen and examined on 12/06/2022   Brief hospital course: 72 year old female with past medical history of hypothyroidism, hypertension, prior stroke with vascular dementia brought into the ED after decreased mental status and not walking as much.  She had a fall around a week ago.  In the ER found to have bilateral subdural hematomas with resultant 4 mm midline shift to the left and possible subarachnoid hemorrhage.  11/23.  Family to decide on whether or not they want to do conservative management or neurosurgical intervention. 11/24.  Patient did not do well with the speech therapist this morning.  Patient's husband stated that she was able to feed her last night.  Family has decided to go forward with the neurosurgical procedure tomorrow.  Assessment and Plan: * Traumatic subdural hematoma without loss of consciousness (HCC) Traumatic subarachnoid hemorrhage History of fall with head strike 2 weeks prior to admission Family will likely proceed with neurosurgical intervention tomorrow.  Patient did not do well with the speech therapy evaluation today.  Recommended n.p.o. status.  Patient's husband stated that he was able to feed her last night a little bit.  Continue IV fluids.  Acute metabolic encephalopathy Secondary to traumatic subdural and subarachnoid hemorrhage.  Urinalysis has too many squamous epithelial cells, urine culture not warranted.   History of CVA (cerebrovascular accident) Hold aspirin  Essential hypertension Continued losartan at lower dose.  VAD (vascular dementia) (HCC) Continue sertraline Delirium precautions   Hypothyroidism Continue levothyroxine        Subjective: Patient has dementia and did not talk with me today.  Speech therapist saw the patient and recommended n.p.o. status.  Physical Exam: Vitals:   12/06/22 0414 12/06/22  0500 12/06/22 0749 12/06/22 1118  BP:   124/79 (!) 142/79  Pulse: 70  (!) 53 (!) 54  Resp:   18 16  Temp: 98.4 F (36.9 C)  98.4 F (36.9 C) 98.2 F (36.8 C)  TempSrc: Oral  Oral   SpO2: 100%  100% 100%  Weight:  54.8 kg    Height:       Physical Exam HENT:     Head: Normocephalic.     Mouth/Throat:     Comments: Did not open mouth for me. Eyes:     General: Lids are normal.     Conjunctiva/sclera: Conjunctivae normal.  Cardiovascular:     Rate and Rhythm: Normal rate and regular rhythm.     Heart sounds: Normal heart sounds, S1 normal and S2 normal.  Pulmonary:     Breath sounds: No decreased breath sounds, wheezing, rhonchi or rales.  Abdominal:     Palpations: Abdomen is soft.     Tenderness: There is no abdominal tenderness.  Musculoskeletal:     Right lower leg: No swelling.     Left lower leg: No swelling.  Neurological:     Mental Status: She is alert.     Comments: Moved her legs when touched her feet.     Data Reviewed: Creatinine 0.69, hemoglobin 10.5  Family Communication: Spoke with husband at the bedside this morning and daughter and husband on the phone this afternoon.  Disposition: Status is: Inpatient Remains inpatient appropriate because: Family deciding to proceed with neurosurgical procedure for tomorrow.  Planned Discharge Destination: To be determined    Time spent: 28 minutes  Author: Alford Highland, MD 12/06/2022 12:59 PM  For on  call review [email or web address].

## 2022-12-06 NOTE — Progress Notes (Signed)
Neurosurgery Progress Note  History: Christine Hull admitted for bilateral subdural hematomas with symptomatic mass effect.  Per husband, patient still more sleepy than normal. Wakes up easily to voice.  Tolerating diet  Physical Exam: Vitals:   12/06/22 0414 12/06/22 0749  BP:  124/79  Pulse: 70 (!) 53  Resp:  18  Temp: 98.4 F (36.9 C) 98.4 F (36.9 C)  SpO2: 100% 100%    E3, then closes eyes Intermittently mumbles Does not follow commands Moves all extremities x4 symmetrically  Assessment/Plan:  Cathleen Fears with symptomatic bilateral SDH.  Discussed with husband and daughter (via telephone) the risks, indications, and alternatives to surgical intervention, specifically burr hole drainage of the SDH.  The husband requests to have an extended family meeting to determine if they would like to proceed with surgery and therefore requests to wait till tomorrow.  -ok for diet today -NPO at midnight for possible surgical intervention  Erenest Rasher, MD Neurosurgery

## 2022-12-07 ENCOUNTER — Encounter
Admission: EM | Disposition: A | Discharge status: Skilled Nursing Facility | Payer: Self-pay | Source: Home / Self Care | Attending: Internal Medicine

## 2022-12-07 ENCOUNTER — Inpatient Hospital Stay: Payer: Medicare Other | Admitting: Registered Nurse

## 2022-12-07 ENCOUNTER — Encounter: Payer: Self-pay | Admitting: Internal Medicine

## 2022-12-07 ENCOUNTER — Telehealth: Payer: Self-pay

## 2022-12-07 ENCOUNTER — Other Ambulatory Visit: Payer: Self-pay

## 2022-12-07 DIAGNOSIS — S065X0S Traumatic subdural hemorrhage without loss of consciousness, sequela: Secondary | ICD-10-CM | POA: Diagnosis not present

## 2022-12-07 DIAGNOSIS — S066X0S Traumatic subarachnoid hemorrhage without loss of consciousness, sequela: Secondary | ICD-10-CM

## 2022-12-07 DIAGNOSIS — R001 Bradycardia, unspecified: Secondary | ICD-10-CM

## 2022-12-07 DIAGNOSIS — S065X0A Traumatic subdural hemorrhage without loss of consciousness, initial encounter: Secondary | ICD-10-CM | POA: Diagnosis not present

## 2022-12-07 DIAGNOSIS — S065XAA Traumatic subdural hemorrhage with loss of consciousness status unknown, initial encounter: Secondary | ICD-10-CM | POA: Diagnosis not present

## 2022-12-07 DIAGNOSIS — G9341 Metabolic encephalopathy: Secondary | ICD-10-CM | POA: Diagnosis not present

## 2022-12-07 DIAGNOSIS — I10 Essential (primary) hypertension: Secondary | ICD-10-CM | POA: Diagnosis not present

## 2022-12-07 HISTORY — PX: BURR HOLE: SHX908

## 2022-12-07 LAB — MAGNESIUM: Magnesium: 1.8 mg/dL (ref 1.7–2.4)

## 2022-12-07 LAB — PHOSPHORUS: Phosphorus: 2.6 mg/dL (ref 2.5–4.6)

## 2022-12-07 LAB — HEMOGLOBIN AND HEMATOCRIT, BLOOD
HCT: 30.1 % — ABNORMAL LOW (ref 36.0–46.0)
Hemoglobin: 10.4 g/dL — ABNORMAL LOW (ref 12.0–15.0)

## 2022-12-07 LAB — POTASSIUM: Potassium: 3.4 mmol/L — ABNORMAL LOW (ref 3.5–5.1)

## 2022-12-07 LAB — GLUCOSE, CAPILLARY
Glucose-Capillary: 88 mg/dL (ref 70–99)
Glucose-Capillary: 78 mg/dL (ref 70–99)
Glucose-Capillary: 182 mg/dL — ABNORMAL HIGH (ref 70–99)

## 2022-12-07 LAB — MRSA NEXT GEN BY PCR, NASAL: MRSA by PCR Next Gen: NOT DETECTED

## 2022-12-07 SURGERY — CREATION, CRANIAL BURR HOLE
Anesthesia: General | Site: Head | Laterality: Bilateral

## 2022-12-07 MED ORDER — BACITRACIN ZINC 500 UNIT/GM EX OINT
TOPICAL_OINTMENT | CUTANEOUS | Status: AC
  Filled 2022-12-07: qty 28.35

## 2022-12-07 MED ORDER — GELATIN ABSORBABLE 12-7 MM EX MISC
CUTANEOUS | Status: AC
  Filled 2022-12-07: qty 2

## 2022-12-07 MED ORDER — ONDANSETRON HCL 4 MG/2ML IJ SOLN
INTRAMUSCULAR | Status: DC | PRN
  Administered 2022-12-07 (×2): 4 mg via INTRAVENOUS

## 2022-12-07 MED ORDER — GLYCOPYRROLATE 0.2 MG/ML IJ SOLN
INTRAMUSCULAR | Status: DC | PRN
  Administered 2022-12-07: .2 mg via INTRAVENOUS

## 2022-12-07 MED ORDER — LIDOCAINE-EPINEPHRINE 1 %-1:100000 IJ SOLN
INTRAMUSCULAR | Status: DC | PRN
  Administered 2022-12-07: 10 mL

## 2022-12-07 MED ORDER — DEXTROSE-SODIUM CHLORIDE 5-0.9 % IV SOLN: INTRAVENOUS | Status: DC

## 2022-12-07 MED ORDER — FENTANYL CITRATE (PF) 100 MCG/2ML IJ SOLN
INTRAMUSCULAR | Status: DC | PRN
  Administered 2022-12-07: 5 ug via INTRAVENOUS
  Administered 2022-12-07: 20 ug via INTRAVENOUS

## 2022-12-07 MED ORDER — ACETAMINOPHEN 10 MG/ML IV SOLN: 1000.0000 mg | Freq: Once | INTRAVENOUS | Status: DC | PRN

## 2022-12-07 MED ORDER — CEFAZOLIN SODIUM-DEXTROSE 2-4 GM/100ML-% IV SOLN
INTRAVENOUS | Status: AC
  Filled 2022-12-07: qty 100

## 2022-12-07 MED ORDER — ETOMIDATE 2 MG/ML IV SOLN
INTRAVENOUS | Status: DC | PRN
  Administered 2022-12-07: 12 mg via INTRAVENOUS

## 2022-12-07 MED ORDER — LEVETIRACETAM IN NACL 500 MG/100ML IV SOLN
500.0000 mg | Freq: Two times a day (BID) | INTRAVENOUS | Status: AC
  Administered 2022-12-07 – 2022-12-19 (×24): 500 mg via INTRAVENOUS
  Filled 2022-12-07 (×25): qty 100

## 2022-12-07 MED ORDER — POTASSIUM CHLORIDE 10 MEQ/100ML IV SOLN
10.0000 meq | INTRAVENOUS | Status: AC
  Administered 2022-12-07 (×2): 10 meq via INTRAVENOUS
  Filled 2022-12-07 (×2): qty 100

## 2022-12-07 MED ORDER — GELATIN ABSORBABLE 100 EX MISC
CUTANEOUS | Status: DC | PRN
  Administered 2022-12-07: 1

## 2022-12-07 MED ORDER — DEXAMETHASONE SODIUM PHOSPHATE 10 MG/ML IJ SOLN
INTRAMUSCULAR | Status: DC | PRN
  Administered 2022-12-07: 10 mg via INTRAVENOUS

## 2022-12-07 MED ORDER — SURGIFLO WITH THROMBIN (HEMOSTATIC MATRIX KIT) OPTIME: TOPICAL | Status: DC | PRN

## 2022-12-07 MED ORDER — PROPOFOL 10 MG/ML IV BOLUS
INTRAVENOUS | Status: AC
  Filled 2022-12-07: qty 60

## 2022-12-07 MED ORDER — PROPOFOL 10 MG/ML IV BOLUS
INTRAVENOUS | Status: AC
  Filled 2022-12-07: qty 20

## 2022-12-07 MED ORDER — LIDOCAINE HCL (CARDIAC) PF 100 MG/5ML IV SOSY
PREFILLED_SYRINGE | INTRAVENOUS | Status: DC | PRN
  Administered 2022-12-07: 60 mg via INTRAVENOUS

## 2022-12-07 MED ORDER — ROCURONIUM BROMIDE 100 MG/10ML IV SOLN
INTRAVENOUS | Status: DC | PRN
  Administered 2022-12-07: 20 mg via INTRAVENOUS

## 2022-12-07 MED ORDER — THROMBIN 5000 UNITS EX SOLR
CUTANEOUS | Status: AC
  Filled 2022-12-07: qty 5000

## 2022-12-07 MED ORDER — LIDOCAINE-EPINEPHRINE 1 %-1:100000 IJ SOLN
INTRAMUSCULAR | Status: AC
  Filled 2022-12-07: qty 1

## 2022-12-07 MED ORDER — OXYCODONE HCL 5 MG PO TABS: 5.0000 mg | ORAL_TABLET | Freq: Once | ORAL | Status: DC | PRN

## 2022-12-07 MED ORDER — PHENYLEPHRINE HCL-NACL 20-0.9 MG/250ML-% IV SOLN
INTRAVENOUS | Status: DC | PRN
  Administered 2022-12-07: 25 ug/min via INTRAVENOUS

## 2022-12-07 MED ORDER — FENTANYL CITRATE (PF) 100 MCG/2ML IJ SOLN: 25.0000 ug | INTRAMUSCULAR | Status: DC | PRN

## 2022-12-07 MED ORDER — DEXAMETHASONE SODIUM PHOSPHATE 10 MG/ML IJ SOLN
INTRAMUSCULAR | Status: AC
  Filled 2022-12-07: qty 1

## 2022-12-07 MED ORDER — VASOPRESSIN 20 UNIT/ML IV SOLN
INTRAVENOUS | Status: DC | PRN
  Administered 2022-12-07 (×3): 1 [IU] via INTRAVENOUS

## 2022-12-07 MED ORDER — SODIUM CHLORIDE (PF) 0.9 % IJ SOLN
INTRAMUSCULAR | Status: AC
  Filled 2022-12-07: qty 50

## 2022-12-07 MED ORDER — ACETAMINOPHEN 10 MG/ML IV SOLN
INTRAVENOUS | Status: AC
  Filled 2022-12-07: qty 100

## 2022-12-07 MED ORDER — ACETAMINOPHEN 10 MG/ML IV SOLN
INTRAVENOUS | Status: DC | PRN
  Administered 2022-12-07: 1000 mg via INTRAVENOUS

## 2022-12-07 MED ORDER — PHENYLEPHRINE 80 MCG/ML (10ML) SYRINGE FOR IV PUSH (FOR BLOOD PRESSURE SUPPORT)
PREFILLED_SYRINGE | INTRAVENOUS | Status: DC | PRN
  Administered 2022-12-07: 160 ug via INTRAVENOUS
  Administered 2022-12-07: 80 ug via INTRAVENOUS
  Administered 2022-12-07: 160 ug via INTRAVENOUS

## 2022-12-07 MED ORDER — SODIUM CHLORIDE 0.9 % IV SOLN: INTRAVENOUS | Status: DC | PRN

## 2022-12-07 MED ORDER — ROCURONIUM BROMIDE 10 MG/ML (PF) SYRINGE
PREFILLED_SYRINGE | INTRAVENOUS | Status: AC
  Filled 2022-12-07: qty 10

## 2022-12-07 MED ORDER — LIDOCAINE HCL (PF) 2 % IJ SOLN
INTRAMUSCULAR | Status: AC
  Filled 2022-12-07: qty 5

## 2022-12-07 MED ORDER — ONDANSETRON HCL 4 MG/2ML IJ SOLN
INTRAMUSCULAR | Status: AC
  Filled 2022-12-07: qty 2

## 2022-12-07 MED ORDER — MIDAZOLAM HCL 2 MG/2ML IJ SOLN
INTRAMUSCULAR | Status: AC
Start: 2022-12-07 — End: ?
  Filled 2022-12-07: qty 2

## 2022-12-07 MED ORDER — FENTANYL CITRATE (PF) 100 MCG/2ML IJ SOLN
INTRAMUSCULAR | Status: AC
  Filled 2022-12-07: qty 2

## 2022-12-07 MED ORDER — CHLORHEXIDINE GLUCONATE CLOTH 2 % EX PADS
6.0000 | MEDICATED_PAD | Freq: Every day | CUTANEOUS | Status: DC
  Administered 2022-12-07 – 2022-12-14 (×8): 6 via TOPICAL

## 2022-12-07 MED ORDER — CEFAZOLIN SODIUM-DEXTROSE 2-4 GM/100ML-% IV SOLN
2.0000 g | INTRAVENOUS | Status: AC
  Administered 2022-12-07: 2 g via INTRAVENOUS

## 2022-12-07 MED ORDER — TRANEXAMIC ACID-NACL 1000-0.7 MG/100ML-% IV SOLN
INTRAVENOUS | Status: AC
  Filled 2022-12-07: qty 100

## 2022-12-07 MED ORDER — BACITRACIN 500 UNIT/GM EX OINT
TOPICAL_OINTMENT | CUTANEOUS | Status: DC | PRN
  Administered 2022-12-07: 6 via TOPICAL

## 2022-12-07 MED ORDER — DEXTROSE-SODIUM CHLORIDE 5-0.9 % IV SOLN: INTRAVENOUS | Status: AC

## 2022-12-07 MED ORDER — TRANEXAMIC ACID-NACL 1000-0.7 MG/100ML-% IV SOLN
1000.0000 mg | INTRAVENOUS | Status: AC
  Administered 2022-12-07: 1000 mg via INTRAVENOUS

## 2022-12-07 MED ORDER — SUGAMMADEX SODIUM 200 MG/2ML IV SOLN
INTRAVENOUS | Status: DC | PRN
  Administered 2022-12-07: 200 mg via INTRAVENOUS

## 2022-12-07 MED ORDER — OXYCODONE HCL 5 MG/5ML PO SOLN: 5.0000 mg | Freq: Once | ORAL | Status: DC | PRN

## 2022-12-07 SURGICAL SUPPLY — 37 items
BLADE CLIPPER SPEC (BLADE) ×1 IMPLANT
BRUSH SCRUB EZ 4% CHG (MISCELLANEOUS) ×1 IMPLANT
CNTNR URN SCR LID CUP LEK RST (MISCELLANEOUS) ×1 IMPLANT
DRAIN JP 7F FLT 3/4 PRF SI HBL (DRAIN) IMPLANT
DRAIN RND TROCAR 1/8FLUTED 10F (DRAIN) ×1 IMPLANT
DRSG TEGADERM 4X4.75 (GAUZE/BANDAGES/DRESSINGS) IMPLANT
DRSG TELFA 3X4 N-ADH STERILE (GAUZE/BANDAGES/DRESSINGS) ×1 IMPLANT
ELECT REM PT RETURN 9FT ADLT (ELECTROSURGICAL) ×1
ELECTRODE REM PT RTRN 9FT ADLT (ELECTROSURGICAL) ×1 IMPLANT
EVACUATOR SILICONE 100CC (DRAIN) ×1 IMPLANT
GAUZE SPONGE 4X4 12PLY STRL (GAUZE/BANDAGES/DRESSINGS) IMPLANT
GLOVE SRG 8 PF TXTR STRL LF DI (GLOVE) ×1 IMPLANT
GLOVE SURG SYN 7.5 E (GLOVE) ×2 IMPLANT
GLOVE SURG SYN 7.5 PF PI (GLOVE) ×2 IMPLANT
GOWN SRG XL LVL 3 NONREINFORCE (GOWNS) ×1 IMPLANT
HOLDER FOLEY CATH W/STRAP (MISCELLANEOUS) IMPLANT
KIT TURNOVER KIT A (KITS) ×1 IMPLANT
MANIFOLD NEPTUNE II (INSTRUMENTS) ×1 IMPLANT
MAT ABSORB FLUID 56X50 GRAY (MISCELLANEOUS) ×1 IMPLANT
PACK LAMINECTOMY ARMC (PACKS) ×1 IMPLANT
PAD ARMBOARD 7.5X6 YLW CONV (MISCELLANEOUS) ×2 IMPLANT
PERFORATOR LRG 14-11MM (BIT) ×1 IMPLANT
PIN MAYFIELD SKULL DISP (PIN) IMPLANT
PLATE 1.5 6HOLE XLONG DBL Y (Plate) IMPLANT
PLATE 1.5/0.5 18.5MM BURR HOLE (Plate) IMPLANT
PLATE SHUNT (Plate) IMPLANT
SCREW SELF DRILL HT 1.5/4MM (Screw) IMPLANT
SHEET NEURO XL SOL CTL (MISCELLANEOUS) ×1 IMPLANT
STAPLER SKIN PROX 35W (STAPLE) ×2 IMPLANT
SURGIFLO W/THROMBIN 8M KIT (HEMOSTASIS) IMPLANT
SUT ETHILON 3-0 (SUTURE) ×1 IMPLANT
SUT ETHILON 3-0 FS-10 30 BLK (SUTURE) ×2
SUT VICRYL 2-0 SH 8X27 (SUTURE) ×2 IMPLANT
SUTURE EHLN 3-0 FS-10 30 BLK (SUTURE) IMPLANT
TRAP FLUID SMOKE EVACUATOR (MISCELLANEOUS) ×1 IMPLANT
TRAY FOLEY SLVR 16FR LF STAT (SET/KITS/TRAYS/PACK) IMPLANT
WATER STERILE IRR 1000ML POUR (IV SOLUTION) ×4 IMPLANT

## 2022-12-07 NOTE — Progress Notes (Signed)
Neurosurgery Progress Note  History: Christine Hull admitted for bilateral subdural hematomas with symptomatic mass effect.  Per history, baseline is Ox2.  She is not communicative this morning.   Physical Exam: Vitals:   12/07/22 0000 12/07/22 0400  BP: (!) 133/55 (!) 149/86  Pulse: (!) 52 (!) 53  Resp: 14 16  Temp: 97.8 F (36.6 C) 98.6 F (37 C)  SpO2:      OE to voice, but does not follow commands.  Nonverbal.  Regards.   Intermittently mumbles Moves all extremities x4 symmetrically  Assessment/Plan:  Christine Hull with symptomatic bilateral SDH.    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  Will plan for admission to ICU post-op.   [facility] Neurosurgery

## 2022-12-07 NOTE — Progress Notes (Signed)
SLP Cancellation Note  Patient Details Name: Christine Hull MRN: [id number] DOB: 1950-11-07   Cancelled treatment:       Reason Eval/Treat Not Completed: Medical issues which prohibited therapy;Patient not medically ready (chart reviewed)  Per Neurosurgery note today, pt is not communicative this morning. Pt with "symptomatic bilateral SDH. 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  Will plan for admission to ICU post-op.".  ST services will Hold on swallo assessment today and f/u per medical status/appropriateness next 1-2 days.     [name], MS, CCC-SLP Speech Language Pathologist Rehab Services; [facility] [phone] (ascom) [name] 12/07/2022, 7:52 AM

## 2022-12-07 NOTE — Assessment & Plan Note (Signed)
Not on any rate controlling medications. ?

## 2022-12-07 NOTE — Transfer of Care (Signed)
Immediate Anesthesia Transfer of Care Note  Patient: Christine Hull  Procedure(s) Performed: Ezekiel Ina (Bilateral: Head)  Patient Location: PACU  Anesthesia Type:General  Level of Consciousness: drowsy  Airway & Oxygen Therapy: Patient Spontanous Breathing and Patient connected to face mask oxygen  Post-op Assessment: Report given to RN and Post -op Vital signs reviewed and stable  Post vital signs: Reviewed and stable  Last Vitals:  Vitals Value Taken Time  BP    Temp    Pulse    Resp    SpO2      Last Pain:  Vitals:   12/07/22 1520  TempSrc: Temporal  PainSc: 0-No pain         Complications: No notable events documented.

## 2022-12-07 NOTE — Progress Notes (Signed)
Progress Note   Patient: Christine Hull:[id number] DOB: 11-20-1950 DOA: 12/04/2022     3 DOS: the patient was seen and examined on 12/07/2022   Brief hospital course: 72 year old female with past medical history of hypothyroidism, hypertension, prior stroke with vascular dementia brought into the ED after decreased mental status and not walking as much.  She had a fall around a week ago.  In the ER found to have bilateral subdural hematomas with resultant 4 mm midline shift to the left and possible subarachnoid hemorrhage.  11/23.  Family to decide on whether or not they want to do conservative management or neurosurgical intervention. 11/24.  Patient did not do well with the speech therapist this morning.  Patient's husband stated that she was able to feed her last night.  Family has decided to go forward with the neurosurgical procedure tomorrow. 11/25.  Patient did not speak with me today.  Going to the OR this afternoon.  Assessment and Plan: * Traumatic subdural hematoma without loss of consciousness (HCC) Traumatic subarachnoid hemorrhage History of fall with head strike 2 weeks prior to admission Family will proceed with neurosurgical burr hole procedure today.  Patient did not do well with the speech therapy evaluation yesterday.  Recommended n.p.o. status for now. Continue IV fluids.  Acute metabolic encephalopathy Secondary to traumatic subdural and subarachnoid hemorrhage.  Urinalysis has too many squamous epithelial cells, urine culture not warranted.   History of CVA (cerebrovascular accident) Hold aspirin  Sinus bradycardia Not on any rate controlling medications.  Essential hypertension Continued losartan at lower dose.  VAD (vascular dementia) (HCC) Continue sertraline Delirium precautions   Hypothyroidism Continue levothyroxine        Subjective: Came in with altered mental status and fall found to have bilateral subdural hematomas with mass  effect.  Did not speak with me today.  Physical Exam: Vitals:   12/07/22 0000 12/07/22 0400 12/07/22 0611 12/07/22 1140  BP: (!) 133/55 (!) 149/86    Pulse: (!) 52 (!) 53    Resp: 14 16    Temp: 97.8 F (36.6 C) 98.6 F (37 C)  98.4 F (36.9 C)  TempSrc: Oral Axillary  Oral  SpO2:      Weight:   55.8 kg   Height:       Physical Exam HENT:     Head: Normocephalic.     Mouth/Throat:     Comments: Did not open mouth for me. Eyes:     General: Lids are normal.     Conjunctiva/sclera: Conjunctivae normal.  Cardiovascular:     Rate and Rhythm: Regular rhythm. Bradycardia present.     Heart sounds: Normal heart sounds, S1 normal and S2 normal.  Pulmonary:     Breath sounds: No decreased breath sounds, wheezing, rhonchi or rales.  Abdominal:     Palpations: Abdomen is soft.     Tenderness: There is no abdominal tenderness.  Musculoskeletal:     Right lower leg: No swelling.     Left lower leg: No swelling.  Neurological:     Mental Status: She is alert.     Comments: Moved her legs when touched her feet.     Data Reviewed: Creatinine 0.69, potassium 3.5, sodium 137, hemoglobin 10.5  Family Communication: Spoke with husband at the bedside  Disposition: Status is: Inpatient Remains inpatient appropriate because: Going to the operating room today for burr holes bilaterally  Planned Discharge Destination: To be determined    Time spent: 27 minutes  Author:  Alford Highland, MD 12/07/2022 2:08 PM  For on call review [email or web address].

## 2022-12-07 NOTE — Telephone Encounter (Addendum)
Dr Myer Haff & Dr Katrinka Blazing saw this patient as a consult on 12/07/22. Surgery with Dr Katrinka Blazing on 12/07/22: burr hole drainage of her chronic subdural hematomas. Per discussion with Dr Katrinka Blazing, she will need a repeat head CT in 4-6 weeks (around 01/04/23-01/18/23) prior to her 6 week post op appointment. CT has been ordered. Post op appts have been scheduled. Pt will need to be notified of post op appts and plan for CT prior to 6 week appt.

## 2022-12-07 NOTE — Progress Notes (Signed)
While pulling out of bay in PACU to go to ICU patient had eye movement toward right and upward, blinking, and patient appeared stiff and shaking arms, apppeared scared, unable to console during this time. O2 decreased to 88% and HR increased to 120s. Episode lasted about a minute before patient returned to baseline (resting with eyes closed). Neuro MD on call, Dr. Myer Haff notified and said anti seizure medications would be ordered and if it happened again patient would have a tele neuro consult placed. Passed this information off to ICU RN. Patients VSS on arrival to ICU.

## 2022-12-07 NOTE — Anesthesia Preprocedure Evaluation (Signed)
Anesthesia Evaluation  Patient identified by MRN, date of birth, ID band Patient awake    Reviewed: Allergy & Precautions, H&P , NPO status , Patient's Chart, lab work & pertinent test results, reviewed documented beta blocker date and time   Airway Mallampati: II  TM Distance: >3 FB Neck ROM: full    Dental  (+) Teeth Intact   Pulmonary neg pulmonary ROS, Current Smoker   Pulmonary exam normal        Cardiovascular Exercise Tolerance: Poor hypertension, On Medications Normal cardiovascular exam+ Valvular Problems/Murmurs  Rhythm:regular Rate:Normal     Neuro/Psych  Headaches PSYCHIATRIC DISORDERS     Dementia    GI/Hepatic negative GI ROS, Neg liver ROS,,,  Endo/Other  Hypothyroidism    Renal/GU negative Renal ROS  negative genitourinary   Musculoskeletal   Abdominal   Peds  Hematology negative hematology ROS (+)   Anesthesia Other Findings Past Medical History: No date: Dementia (HCC) No date: Heart murmur No date: Hypertension No date: Hypothyroidism 05/09/2013: Medtronic LINQ 02/02/2013: Near syncope 02/02/2013: Prolonged QT interval 05/09/2013: Sinus bradycardia Past Surgical History: 1990's: AUGMENTATION MAMMAPLASTY; Bilateral     Comment:  "had implants put in; had them taken out 6 months later"              (02/02/2013) 2011: BREAST BIOPSY; Right     Comment:  Evette Cristal,  normal 02/03/2013: LOOP RECORDER IMPLANT; N/A     Comment:  Procedure: LOOP RECORDER IMPLANT;  Surgeon: Duke Salvia, MD;  Location: [facility] CATH LAB;  Service:               Cardiovascular;  Laterality: N/A; BMI    Body Mass Index: 20.47 kg/m     Reproductive/Obstetrics negative OB ROS                             Anesthesia Physical Anesthesia Plan  ASA: 3 and emergent  Anesthesia Plan: General ETT   Post-op Pain Management:    Induction:   PONV Risk Score and Plan:   Airway  Management Planned:   Additional Equipment:   Intra-op Plan:   Post-operative Plan:   Informed Consent: I have reviewed the patients History and Physical, chart, labs and discussed the procedure including the risks, benefits and alternatives for the proposed anesthesia with the patient or authorized representative who has indicated his/her understanding and acceptance.     Dental Advisory Given  Plan Discussed with: CRNA  Anesthesia Plan Comments:        Anesthesia Quick Evaluation

## 2022-12-07 NOTE — Anesthesia Postprocedure Evaluation (Signed)
Anesthesia Post Note  Patient: Christine Hull  Procedure(s) Performed: Ezekiel Ina (Bilateral: Head)  Patient location during evaluation: PACU Anesthesia Type: General Level of consciousness: awake and alert Pain management: pain level controlled Vital Signs Assessment: post-procedure vital signs reviewed and stable Respiratory status: spontaneous breathing, nonlabored ventilation, respiratory function stable and patient connected to nasal cannula oxygen Cardiovascular status: blood pressure returned to baseline and stable Postop Assessment: no apparent nausea or vomiting Anesthetic complications: no   No notable events documented.   Last Vitals:  Vitals:   12/07/22 2230 12/07/22 2300  BP:  (!) 131/96  Pulse: (!) 54 61  Resp: 10 14  Temp:    SpO2: 98% 99%    Last Pain:  Vitals:   12/07/22 1950  TempSrc: Axillary  PainSc:                  Yevette Edwards

## 2022-12-07 NOTE — H&P (View-Only) (Signed)
Neurosurgery Progress Note  History: Christine Hull admitted for bilateral subdural hematomas with symptomatic mass effect.  Per history, baseline is Ox2.  She is not communicative this morning.   Physical Exam: Vitals:   12/07/22 0000 12/07/22 0400  BP: (!) 133/55 (!) 149/86  Pulse: (!) 52 (!) 53  Resp: 14 16  Temp: 97.8 F (36.6 C) 98.6 F (37 C)  SpO2:      OE to voice, but does not follow commands.  Nonverbal.  Regards.   Intermittently mumbles Moves all extremities x4 symmetrically  Assessment/Plan:  Christine Hull with symptomatic bilateral SDH.    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  Will plan for admission to ICU post-op.   [facility] Neurosurgery

## 2022-12-07 NOTE — Progress Notes (Signed)
OT Cancellation Note  Patient Details Name: Christine Hull MRN: [id number] DOB: January 13, 1950   Cancelled Treatment:    Reason Eval/Treat Not Completed: Patient not medically ready. Consult received and chart reviewed.  Patient admitted for bilateral subdural hematomas with plan for burr hole drainage per neurosurgery team. Pt not appropriate for OT at this time, will sign off. Please place new orders as medically appropriate.    Kathie Dike, M.S. OTR/L  12/07/22, 8:38 AM  ascom [phone]

## 2022-12-07 NOTE — TOC Initial Note (Signed)
Transition of Care [facility]) - Initial/Assessment Note    Patient Details  Name: Christine Hull MRN: [id number] Date of Birth: 1950-12-08  [facility] of [facility]) CM/SW Contact:    Darolyn Rua, LCSW Phone Number: 12/07/2022, 10:45 AM  Clinical Narrative:                  Patient presented to ED from home via POV, hx of recent fall 11/22, recently observed to have increased lethargy, unable to complete ADL's and decreased mobility with history of  Dementia dx.  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 Will plan for admission to ICU post-op.    TOC connected with patient spouse Jonny Ruiz, no questions/concerns at this time. TOC will continue to follow for discharge planning needs following procedure and anticipated ICU admission. .    Expected Discharge Plan:  (TBD) Barriers to Discharge: Continued Medical Work up   Patient Goals and CMS Choice   CMS Medicare.gov Compare Post Acute Care list provided to:: Patient Choice offered to / list presented to : Patient      Expected Discharge Plan and Services       Living arrangements for the past 2 months: Single Family Home                                      Prior Living Arrangements/Services Living arrangements for the past 2 months: Single Family Home Lives with:: Spouse                   Activities of Daily Living   ADL Screening (condition at time of admission) Independently performs ADLs?: No Does the patient have a NEW difficulty with bathing/dressing/toileting/self-feeding that is expected to last >3 days?: Yes (Initiates electronic notice to provider for possible OT consult) Does the patient have a NEW difficulty with getting in/out of bed, walking, or climbing stairs that is expected to last >3 days?: Yes (Initiates electronic notice to provider for possible PT consult) Does the patient have a NEW  difficulty with communication that is expected to last >3 days?: No Is the patient deaf or have difficulty hearing?: No Does the patient have difficulty seeing, even when wearing glasses/contacts?: No Does the patient have difficulty concentrating, remembering, or making decisions?: Yes  Permission Sought/Granted                  Emotional Assessment              Admission diagnosis:  Lethargy [R53.83] SDH (subdural hematoma) (HCC) [S06.5XAA] Traumatic subdural hematoma (HCC) [S06.5XAA] Patient Active Problem List   Diagnosis Date Noted   Traumatic subdural hematoma without loss of consciousness (HCC) 12/04/2022   History of fall 12/04/2022   Acute metabolic encephalopathy 12/04/2022   History of CVA (cerebrovascular accident) 12/04/2022   Traumatic subarachnoid hemorrhage (HCC) 12/04/2022   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) 08/09/2016   Late effect of lacunar infarction 07/19/2016   Hyperlipidemia LDL goal <100 04/28/2016   Alcohol abuse 05/31/2015   Cardiac device in situ 05/09/2013   Essential hypertension 04/01/2013   Near syncope 02/02/2013   Headache 02/02/2013   VAD (vascular dementia) (HCC) 02/02/2013   Prolonged QT interval 02/02/2013   Encounter for preventive health examination 11/17/2012   Chronic back pain greater than 3 months duration 08/16/2011  Hypothyroidism 08/14/2011   Tobacco abuse 08/13/2011   PCP:  Annita Brod, MD Pharmacy:   [facility] [phone] - [address], [place] - [address] [place] [address] Phone: [phone] Fax: [phone]  [id number] [place], [place] - [address] [place] [address] Phone: [phone] Fax: [phone]  [facility] - [place], [place] - [address] [place] [address] Phone: [phone] Fax: [phone]  [facility] - [place], [place] - [address] [place] [address] Phone: [phone] Fax: [phone]  [facility] - [place], [place] - [phone] [place] [address] Phone: [phone] Fax: [phone]  [facility] [address], [place] - [address] [place] [address] Phone: [phone] Fax: [phone]  [id number] [place], [place] - [address] AT [facility] [address] [place] [address] Phone: [phone] Fax: [phone]  [id number] - [place], [place] - [address] AT [facility] [address] [facility] [address] [place] [address] Phone: [phone] Fax: [phone]     Social Determinants of Health (SDOH) Social History: SDOH Screenings   Food Insecurity: Patient Unable To Answer (12/06/2022)  Housing: Patient Unable To Answer (12/06/2022)  Transportation Needs: Patient Unable To Answer (12/06/2022)  Utilities: Patient Unable To Answer (12/06/2022)  Tobacco Use: High Risk (12/07/2022)   SDOH Interventions:     Readmission Risk Interventions     No data to display

## 2022-12-07 NOTE — Consult Note (Signed)
NAME:  Christine Hull, MRN:  [id number], DOB:  1950-05-14, LOS: 3 ADMISSION DATE:  12/04/2022, CONSULTATION DATE:  12/07/22 REFERRING MD:  Dr. Katrinka Blazing, CHIEF COMPLAINT:  head injury   History of Present Illness:  72 year old female presenting to [facility] ED on 12/04/2022 accompanied by family for evaluation of increased lethargy and slowing status post fall 13 days prior.  History obtained per chart review as patient is unable to participate in interview at this time and family is not present bedside. Patient had an unwitnessed fall 2 weeks prior to 11/22, the day after her fall family noted she had a skin tear on her occipital scalp with a little bleeding.  Over the course of the next 2 weeks the patient began declining.  Family reported shuffling gait, increased somnolence and decreased ability to participate in ADLs such as feeding herself.  She was evaluated by her PCP who directed them to the ED for evaluation. At baseline patient is in a wheelchair because of her limited ambulation skills at home and does have a history of dementia and is not always able to verbally answer questions per family.  ED course: Upon arrival patient was awake and alert, moving all extremities symmetrically with stable vital signs.  Labs significant for chronic anemia and borderline UTI.  Imaging significant for bilateral subdural hematomas with 4 mm of shift.  EDP discussed with neurosurgery on-call, patient not a candidate for emergent intervention.  TRH consulted for admission.  Hospital course: See significant hospital events for details.  Most recent Significant labs: (Labs/ Imaging personally reviewed) Hematology: WBC: 5.8, Hgb: 10.5  CT head wo contrast 12/04/2022: Bilateral holohemispheric subdural hematomas (likely acute on subacute) R> L.  There is a resultant 4 mm of midline shift to the left.  Likely small amount of subarachnoid hemorrhage in the left temporal lobe and the middle cranial fossa CT  cervical spine without contrast 12/04/2022: Mild degenerative change no acute fracture or subluxation of the cervical spine.  PCCM consulted for assistance in management and monitoring postoperatively due to acute on chronic bilateral subdural hematomas with midline shift status post bilateral burr holes and epidural drain placement.  Pertinent  Medical History  Vascular Dementia CVA Hypothyroidism Prolonged Qtc HTN  Significant Hospital Events: Including procedures, antibiotic start and stop dates in addition to other pertinent events   11/22: Admit to inpatient by [facility] with Neurosurgical consultation 11/23.  Family to decide on whether or not they want to do conservative management or neurosurgical intervention. 11/24.  Patient did not do well with the speech therapist this morning.  Patient's husband stated that she was able to feed her last night.  Family has decided to go forward with the neurosurgical procedure tomorrow. 11/25.  OR for bilateral burr holes with epidural drains. ICU post-op. PCCM consulted.  Interim History / Subjective:  Patient at baseline since admission per neurosurgery report: opens eyes occasionally to voice, MAE but not to command & non verbal  Objective   Blood pressure 95/72, pulse 60, temperature 98.1 F (36.7 C), temperature source Axillary, resp. rate 10, height 5\' 5"  (1.651 m), weight 57.5 kg, SpO2 97%.        Intake/Output Summary (Last 24 hours) at 12/07/2022 2012 Last data filed at 12/07/2022 1900 Gross per 24 hour  Intake 1203.33 ml  Output 1510 ml  Net -306.67 ml   Filed Weights   12/06/22 0500 12/07/22 0611 12/07/22 1950  Weight: 54.8 kg 55.8 kg 57.5 kg    Examination: General:  Adult female, critically ill, lying in bed, NAD HEENT: MM pink/moist, anicteric, atraumatic, neck supple> bilateral burr holes with epidural drains in place Neuro: non verbal, unable to follow commands, opens eyes occasionally to voice PERRL +3, MAE CV: s1s2  RRR, SB on monitor, no r/m/g Pulm: Regular, non labored on RA , breath sounds clear-BUL & diminished-BLL GI: soft, rounded, non tender, bs x 4 Skin: no rashes/lesions noted Extremities: warm/dry, pulses + 2 R/P, no edema noted  Resolved Hospital Problem list     Assessment & Plan:  Acute on chronic bilateral subdural hematomas with midline shift status post bilateral burr holes and epidural drain placement Acute Encephalopathy s/t to above Suspected Seizure Activity PMHx: CVA, VAD Neurosurgery reported baseline since admission: Moves all extremities but not to command, opens eyes to voice occasionally, nonverbal. Patient observed suspected seizure activity that included right-upward gaze, tremors & rigidity in BUE with hypoxia (88%) and tachycardia (120's) that self-sustained. -Neurosurgery following, recommendations below: -Every 2 hours neurochecks overnight -Head of bed flat -Follow-up CT head in a.m., obtain imaging sooner if mentation changes or declines - f/u post op labs: H&H, K, Mg, Phos - falls, aspiration, seizure precautions - NPO - continue Keppra BID  Best Practice (right click and "Reselect all SmartList Selections" daily)  Diet/type: NPO DVT prophylaxis: SCDs Start: 12/04/22 2257 GI prophylaxis: N/A Lines: N/A Foley:  N/A Code Status:  full code Last date of multidisciplinary goals of care discussion [per primary service]  Labs   CBC: Recent Labs  Lab 12/04/22 2125 12/06/22 0400  WBC 6.2 5.8  NEUTROABS 3.0  --   HGB 11.1* 10.5*  HCT 34.1* 31.4*  MCV 92.9 89.2  PLT 352 391    Basic Metabolic Panel: Recent Labs  Lab 12/04/22 2125 12/06/22 0400  NA 134* 137  K 3.8 3.5  CL 102 103  CO2 22 26  GLUCOSE 98 116*  BUN 14 10  CREATININE 0.61 0.69  CALCIUM 8.4* 8.5*   GFR: Estimated Creatinine Clearance: 56.4 mL/min (by C-G formula based on SCr of 0.69 mg/dL). Recent Labs  Lab 12/04/22 2125 12/06/22 0400  WBC 6.2 5.8    Liver Function  Tests: No results for input(s): "AST", "ALT", "ALKPHOS", "BILITOT", "PROT", "ALBUMIN" in the last 168 hours. No results for input(s): "LIPASE", "AMYLASE" in the last 168 hours. No results for input(s): "AMMONIA" in the last 168 hours.  ABG No results found for: "PHART", "PCO2ART", "PO2ART", "HCO3", "TCO2", "ACIDBASEDEF", "O2SAT"   Coagulation Profile: No results for input(s): "INR", "PROTIME" in the last 168 hours.  Cardiac Enzymes: No results for input(s): "CKTOTAL", "CKMB", "CKMBINDEX", "TROPONINI" in the last 168 hours.  HbA1C: No results found for: "HGBA1C"  CBG: Recent Labs  Lab 12/05/22 0736 12/06/22 0423 12/07/22 0605 12/07/22 1143 12/07/22 1933  GLUCAP 95 118* 88 78 182*    Review of Systems:   UTA- patient non-verbal Past Medical History:  She,  has a past medical history of Dementia (HCC), Heart murmur, Hypertension, Hypothyroidism, Medtronic LINQ (05/09/2013), Near syncope (02/02/2013), Prolonged QT interval (02/02/2013), and Sinus bradycardia (05/09/2013).   Surgical History:   Past Surgical History:  Procedure Laterality Date   AUGMENTATION MAMMAPLASTY Bilateral 1990's   "had implants put in; had them taken out 6 months later" (02/02/2013)   BREAST BIOPSY Right 2011   Sankar,  normal   LOOP RECORDER IMPLANT N/A 02/03/2013   Procedure: LOOP RECORDER IMPLANT;  Surgeon: Duke Salvia, MD;  Location: [facility] C[facility] LAB;  Service: Cardiovascular;  Laterality: N/A;  Social History:   reports that she has been smoking cigarettes. She has a 20.5 pack-year smoking history. She has never used smokeless tobacco. She reports current alcohol use of about 5.0 standard drinks of alcohol per week. She reports that she does not use drugs.   Family History:  Her family history includes Cancer in her maternal aunt; Early death in her father and mother; Stroke in her mother. There is no history of Breast cancer.   Allergies No Known Allergies   Home Medications  Prior to  Admission medications   Medication Sig Start Date End Date Taking? Authorizing Provider  donepezil (ARICEPT) 10 MG tablet Take 10 mg by mouth daily. 11/01/22  Yes [provider]  liothyronine (CYTOMEL) 5 MCG tablet TAKE 1 TABLET DAILY (PLEASE CONTACT OFFICE FOR A FOLLOW UP FOR FURTHER REFILLS. LAST REFILL WITHOUT OFFICE VISIT) 12/14/18  Yes Sherlene Shams, MD  liver oil-zinc oxide (DESITIN) 40 % ointment Apply 1 application topically as needed for irritation. 02/16/21  Yes Fondaw, Wylder S, PA  nadolol (CORGARD) 20 MG tablet Take 1 tablet by mouth daily. 07/21/21  Yes [provider]  sertraline (ZOLOFT) 50 MG tablet TAKE 1 TABLET DAILY 08/22/19  Yes Sherlene Shams, MD  SYNTHROID 50 MCG tablet TAKE 1 TABLET DAILY BEFORE BREAKFAST (PLEASE CONTACT OFFICE FOR A FOLLOW UP FOR FURTHER REFILLS) 04/12/17  Yes Sherlene Shams, MD  triamcinolone cream (KENALOG) 0.5 % APPLY THIN LAYER TOPICALLY TO THE AFFECTED AREA TWICE DAILY   Yes [provider]  fish oil-omega-3 fatty acids 1000 MG capsule Take 1 g by mouth daily.  Patient not taking: Reported on 12/05/2022    [provider]  losartan (COZAAR) 100 MG tablet Take 1 tablet (100 mg total) by mouth daily. 02/19/17 05/20/17  Sherlene Shams, MD  nicotine (NICODERM CQ) 14 mg/24hr patch Place 1 patch (14 mg total) onto the skin daily. Patient not taking: Reported on 12/05/2022 02/19/17   Sherlene Shams, MD  nystatin cream (MYCOSTATIN) Apply to affected area 2 times daily Patient not taking: Reported on 12/04/2022 02/16/21   Gailen Shelter, PA     Critical care time: 60 minutes       Betsey Holiday, AGACNP-BC Acute Care Nurse Practitioner [facility]   [phone] / [phone] Please see Amion for details.

## 2022-12-07 NOTE — Anesthesia Procedure Notes (Signed)
Procedure Name: Intubation Date/Time: 12/07/2022 4:24 PM  Performed by: Morene Crocker, CRNAPre-anesthesia Checklist: Patient identified, Patient being monitored, Timeout performed, Emergency Drugs available and Suction available Patient Re-evaluated:Patient Re-evaluated prior to induction Oxygen Delivery Method: Circle system utilized Preoxygenation: Pre-oxygenation with 100% oxygen Induction Type: IV induction Ventilation: Mask ventilation without difficulty Laryngoscope Size: 3 and McGrath Grade View: Grade I Tube type: Oral Tube size: 6.5 mm Number of attempts: 1 Airway Equipment and Method: Stylet Placement Confirmation: ETT inserted through vocal cords under direct vision, positive ETCO2 and breath sounds checked- equal and bilateral Secured at: 21 cm Tube secured with: Tape Dental Injury: Teeth and Oropharynx as per pre-operative assessment  Comments: Smooth atraumatic intubation, no complications noted.

## 2022-12-07 NOTE — Interval H&P Note (Signed)
History and Physical Interval Note:  12/07/2022 3:42 PM  Christine Hull  has presented today for surgery, with the diagnosis of subdural hematoma.  The various methods of treatment have been discussed with the patient and family. After consideration of risks, benefits and other options for treatment, the patient has consented to  Procedure(s) with comments: BURR HOLES (Bilateral) - Bilateral Burr holes as a surgical intervention.  The patient's history has been reviewed, patient examined, no change in status, stable for surgery.  I have reviewed the patient's chart and labs.  Questions were answered to the patient's satisfaction.    Heart and lungs clear.   Lovenia Kim

## 2022-12-07 NOTE — Progress Notes (Signed)
PT Cancellation Note  Patient Details Name: Christine Hull MRN: [id number] DOB: 1950/03/05   Cancelled Treatment:    Reason Eval/Treat Not Completed: Other (comment). Per chart review pt pending further medical workup and intervention. PT orders completed at this time, please re-consult when patient is medically appropriate.    Olga Coaster PT, DPT 8:37 AM,12/07/22

## 2022-12-07 NOTE — Op Note (Signed)
Indications: Christine Hull is suffering from a chronic bilateral subdural hematoma causing significant brain compression and symptoms, prompting surgical intervention.   Findings: subdural hematoma  Preoperative Diagnosis: Subdural hematoma Postoperative Diagnosis: same   EBL: 100 ml IVF: See anesthesia report Drains: Bilateral epidural drain placed Disposition: Extubated and Stable to PACU Complications: none  A foley catheter was placed.   Preoperative Note:  Patient's family was met and discussed with preoperatively.  We discussed indications for surgery as well as possible complications.  Specifically we about reaccumulation, need for further surgery, incomplete decompression, postoperative complications, and the following.  Risks of surgery discussed include: infection, bleeding, stroke, coma, death, paralysis, CSF leak, weakness, need for further surgery, persistent symptoms, and the risks of anesthesia. The patient understood these risks and agreed to proceed.  Operative Note:   Procedure:  1) right sided burr holes for drainage of subdural hematoma 2) left sided bur holes for drainage of subdural hematoma  Procedure: After obtaining informed consent, the patient taken to the operating room, placed in supine position, general anesthesia induced.  Her head was placed on a horseshoe.  The CT scan was used to help with incisional planning.  The frontal hole was placed at 10-1/2 cm back and 5-1/2 cm lateral, posterior hole was placed in imaginary line plan for a reverse question mark for trauma craniotomy flap.  This incisional plan was repeated on the left side.  Comprehensive timeout was performed verifying patient's name, MRN, planned procedure as well as the images.  The operative site was prepped and draped.  A timeout was performed.    The incision was injected with local.  A linear incision was made and carried to the skull.  The pericranium was reflected laterally and a  small self-retaining retractor placed.  We repeated this with the posterior bur hole.  The drill was used to make a burr hole.  The dura was coagulated, then divided with a #15 blade.  High-pressure motor oil consistency fluid was expressed from the anterior hole, we are able to see the parenchyma at this point.  We then turned our attention to the posterior hole where a cruciate incision was made in the dura as well.  More high-pressure fluid came out.  We were able to irrigate and noted a clear connection between the anterior and posterior bur holes.  We repeated this process on the left side.  The anterior bur hole was opened in a cruciate fashion, the fluid came out under less pressure but similar in consistency.  We then performed a posterior cruciate incision this also opened up and released more fluid under pressure.  This was irrigated and showed clear communication between the anterior and posterior holes.  Surgifoam was placed over each dural defect.  Cranial plates were placed over each cranial defect.  On the posterior incisions a epidural drain was placed and tunneled laterally approximately 5 cm away from the incision.  The and GYN's were was irrigated, then closed with vicryl and staples.  The drain was secured to the skin, with a nylon suture which can be used later for closure of the bur hole..  Sterile dressings were placed.  Sponge and pattie counts were correct at the end of the procedure.   I performed this procedure with the assistance of Brooke Frisbie, [place].  She helped with tissue retraction, exposure, protection of the neural elements.  I performed the critical portions of the procedure myself.  Lovenia Kim, MD

## 2022-12-08 ENCOUNTER — Encounter: Payer: Self-pay | Admitting: Neurosurgery

## 2022-12-08 ENCOUNTER — Inpatient Hospital Stay: Payer: Medicare Other

## 2022-12-08 DIAGNOSIS — I10 Essential (primary) hypertension: Secondary | ICD-10-CM | POA: Diagnosis not present

## 2022-12-08 DIAGNOSIS — S066X0S Traumatic subarachnoid hemorrhage without loss of consciousness, sequela: Secondary | ICD-10-CM | POA: Diagnosis not present

## 2022-12-08 DIAGNOSIS — G9341 Metabolic encephalopathy: Secondary | ICD-10-CM | POA: Diagnosis not present

## 2022-12-08 DIAGNOSIS — S065X0S Traumatic subdural hemorrhage without loss of consciousness, sequela: Secondary | ICD-10-CM | POA: Diagnosis not present

## 2022-12-08 LAB — GLUCOSE, CAPILLARY
Glucose-Capillary: 150 mg/dL — ABNORMAL HIGH (ref 70–99)
Glucose-Capillary: 120 mg/dL — ABNORMAL HIGH (ref 70–99)
Glucose-Capillary: 94 mg/dL (ref 70–99)
Glucose-Capillary: 80 mg/dL (ref 70–99)
Glucose-Capillary: 88 mg/dL (ref 70–99)
Glucose-Capillary: 96 mg/dL (ref 70–99)
Glucose-Capillary: 92 mg/dL (ref 70–99)

## 2022-12-08 LAB — PHOSPHORUS: Phosphorus: 2.9 mg/dL (ref 2.5–4.6)

## 2022-12-08 LAB — CBC
HCT: 33.9 % — ABNORMAL LOW (ref 36.0–46.0)
Hemoglobin: 11.6 g/dL — ABNORMAL LOW (ref 12.0–15.0)
MCH: 30.6 pg (ref 26.0–34.0)
MCHC: 34.2 g/dL (ref 30.0–36.0)
MCV: 89.4 fL (ref 80.0–100.0)
Platelets: 399 10*3/uL (ref 150–400)
RBC: 3.79 MIL/uL — ABNORMAL LOW (ref 3.87–5.11)
RDW: 12.5 % (ref 11.5–15.5)
WBC: 5.8 10*3/uL (ref 4.0–10.5)
nRBC: 0 % (ref 0.0–0.2)

## 2022-12-08 LAB — BASIC METABOLIC PANEL
Anion gap: 9 (ref 5–15)
BUN: 9 mg/dL (ref 8–23)
CO2: 23 mmol/L (ref 22–32)
Calcium: 8.7 mg/dL — ABNORMAL LOW (ref 8.9–10.3)
Chloride: 106 mmol/L (ref 98–111)
Creatinine, Ser: 0.7 mg/dL (ref 0.44–1.00)
GFR, Estimated: >60 mL/min (ref >60)
Glucose, Bld: 129 mg/dL — ABNORMAL HIGH (ref 70–99)
Potassium: 3.9 mmol/L (ref 3.5–5.1)
Sodium: 138 mmol/L (ref 135–145)

## 2022-12-08 LAB — MAGNESIUM: Magnesium: 2 mg/dL (ref 1.7–2.4)

## 2022-12-08 MED ORDER — DEXTROSE-SODIUM CHLORIDE 5-0.9 % IV SOLN: INTRAVENOUS | Status: AC

## 2022-12-08 NOTE — Progress Notes (Signed)
Progress Note   Patient: Christine Hull:[id number] DOB: Feb 16, 1950 DOA: 12/04/2022     4 DOS: the patient was seen and examined on 12/08/2022   Brief hospital course: 72 year old female with past medical history of hypothyroidism, hypertension, prior stroke with vascular dementia brought into the ED after decreased mental status and not walking as much.  She had a fall around a week ago.  In the ER found to have bilateral subdural hematomas with resultant 4 mm midline shift to the left and possible subarachnoid hemorrhage.  11/23.  Family to decide on whether or not they want to do conservative management or neurosurgical intervention. 11/24.  Patient did not do well with the speech therapist this morning.  Patient's husband stated that she was able to feed her last night.  Family has decided to go forward with the neurosurgical procedure tomorrow. 11/25.  Patient did not speak with me today.  Bilateral burr hole procedure done by neurosurgery 11/26.  Patient alert and looks around but did not talk with me.  Decrease in size of bilateral subdural collections 5 mm on the right (was 1.5 cm previously), 6 mm on the left (was previously 1.1 cm).  Resolution of midline shift.  Assessment and Plan: * Traumatic subdural hematoma without loss of consciousness (HCC) Traumatic subarachnoid hemorrhage History of fall with head strike 2 weeks prior to admission Bilateral burr hole procedure done on 11/25.  Decrease in the amount of blood on subdural hematomas and resolution of midline shift.  Patient did not do well with the speech therapy prior to surgery.  Will need reevaluation. Continue IV fluids.  Empirically placed on Keppra.  Acute metabolic encephalopathy Secondary to traumatic subdural and subarachnoid hemorrhage.  Urinalysis has too many squamous epithelial cells, urine culture not warranted.   History of CVA (cerebrovascular accident) Hold aspirin  Sinus bradycardia Not on any rate  controlling medications.  Essential hypertension Continued losartan at lower dose.  VAD (vascular dementia) (HCC) Continue sertraline Delirium precautions   Hypothyroidism Continue levothyroxine        Subjective: Patient had her eyes open but did not talk with me.  Patient admitted with fall a couple weeks prior to coming into the hospital and found to have bilateral subdural hematoma.  Had bur hole procedure bilaterally done on 11/25.  Physical Exam: Vitals:   12/08/22 1030 12/08/22 1100 12/08/22 1200 12/08/22 1300  BP: (!) 123/91 (!) 145/78 (!) 116/101 131/89  Pulse: 66 63 65 (!) 59  Resp: 18 20 20 13   Temp:   98.1 F (36.7 C)   TempSrc:   Axillary   SpO2: 98% 96% 98% 98%  Weight:      Height:       Physical Exam HENT:     Head: Normocephalic.     Mouth/Throat:     Comments: Did not open mouth for me. Eyes:     General: Lids are normal.     Conjunctiva/sclera: Conjunctivae normal.  Cardiovascular:     Rate and Rhythm: Regular rhythm. Bradycardia present.     Heart sounds: Normal heart sounds, S1 normal and S2 normal.  Pulmonary:     Breath sounds: No decreased breath sounds, wheezing, rhonchi or rales.  Abdominal:     Palpations: Abdomen is soft.     Tenderness: There is no abdominal tenderness.  Musculoskeletal:     Right lower leg: No swelling.     Left lower leg: No swelling.  Neurological:     Mental Status: She  is alert.     Comments: Moved her legs when touched her feet.  Did not speak with me again today.  Bilateral drains from head.     Data Reviewed: Creatinine 0.7, hemoglobin 11.6, white blood cell count 5.8, platelet count 399  Family Communication: Spoke with husband on the phone  Disposition: Status is: Inpatient Remains inpatient appropriate because: Will have to figure out if she is able to pass the swallow evaluation. Planned Discharge Destination: To be determined    Time spent: 27 minutes Radiologist called me with CT scan  results and had neurosurgical team review CT.  Author: Alford Highland, MD 12/08/2022 2:36 PM  For on call review [email or web address].

## 2022-12-08 NOTE — Progress Notes (Signed)
Transition of Care [facility]) - Inpatient Brief Assessment   Patient Details  Name: Christine Hull MRN: [id number] Date of Birth: December 21, 1950  Transition of Care [facility]) CM/SW Contact:    Truddie Hidden, RN Phone Number: 12/08/2022, 10:26 AM   Clinical Narrative: TOC continuing to follow patient's progress throughout discharge planning.    Transition of Care Asessment: Insurance and Status: Insurance coverage has been reviewed Patient has primary care physician: Yes Home environment has been reviewed: Home Prior level of function:: independent Prior/Current Home Services: No current home services Social Determinants of Health Reivew: SDOH reviewed no interventions necessary Readmission risk has been reviewed: Yes Transition of care needs: no transition of care needs at this time

## 2022-12-08 NOTE — Progress Notes (Addendum)
Neurosurgery Progress Note  History: Christine Hull POD 1 s/p bilateral burr holes for  bilateral subdural hematomas with symptomatic mass effect.   Per history, baseline is Ox2.  She is not communicative this morning.   Physical Exam: Vitals:   12/08/22 0900 12/08/22 1000  BP: (!) 150/69 129/85  Pulse: 70 (!) 57  Resp: 16 13  Temp:    SpO2: 99% 97%    OE to voice, but does not follow commands.  Nonverbal.  Regards.   Intermittently mumbles Moves all extremities x4 symmetrically  Dressings clean and dry. Drains in place.  Data:  Output by Drain (mL) 12/06/22 0701 - 12/06/22 1900 12/06/22 1901 - 12/07/22 0700 12/07/22 0701 - 12/07/22 1900 12/07/22 1901 - 12/08/22 0700 12/08/22 0701 - 12/08/22 1054  Closed System Drain 1 Lateral;Right Scalp Bulb (JP)   10 110 20  Closed System Drain 1 Lateral;Left Scalp Bulb (JP)   50 80 15    CT head without contrast: IMPRESSION: Motion degraded exam.   1. Interval postsurgical changes from bilateral burr hole craniotomies and placement of subdural drainage catheters. The right-sided drainage catheter terminates in the craniotomy defect. 2. Postoperative pneumocephalus causes mass effect on the bilateral frontal lobes resulting in a somewhat peaked appearance of the bilateral frontal lobes. While nonspecific, this appearance could be seen in the setting of tension pneumocephalus, which is not excluded. 3. Interval decrease in size of bilateral subdural collections which now measure up to 5 mm on the right and 6 mm on the left, previously 1.5 cm on the right 1.1 cm on the left. Interval resolution of midline shift. 4. Slight interval increase in size of the right temporal horn, which may be secondary to decreased mass effect, but early hydrocephalus is not entirely excluded. Recommend attention on follow up.   Assessment/Plan:  Christine Hull POD 1 s/p bilateral burr holes for  bilateral subdural hematomas with symptomatic  mass effect.   -Continue plan per primary ICU team -CT head showing interval decrease in size of bilateral subdural collections and resolution of midline shift. Pneumocephalus noted and should resolve over time -Drains to remain in place, will continue to monitor output  [name], PA-C Neurosurgery

## 2022-12-08 NOTE — Plan of Care (Signed)
  Problem: Clinical Measurements: Goal: Ability to maintain clinical measurements within normal limits will improve Outcome: Progressing   Problem: Coping: Goal: Level of anxiety will decrease Outcome: Progressing   Problem: Pain Management: Goal: General experience of comfort will improve Outcome: Progressing   Problem: Safety: Goal: Ability to remain free from injury will improve Outcome: Progressing   Problem: Skin Integrity: Goal: Risk for impaired skin integrity will decrease Outcome: Progressing

## 2022-12-09 ENCOUNTER — Inpatient Hospital Stay: Payer: Medicare Other

## 2022-12-09 DIAGNOSIS — S065X0D Traumatic subdural hemorrhage without loss of consciousness, subsequent encounter: Secondary | ICD-10-CM | POA: Diagnosis not present

## 2022-12-09 DIAGNOSIS — F01C18 Vascular dementia, severe, with other behavioral disturbance: Secondary | ICD-10-CM

## 2022-12-09 DIAGNOSIS — E43 Unspecified severe protein-calorie malnutrition: Secondary | ICD-10-CM | POA: Insufficient documentation

## 2022-12-09 DIAGNOSIS — G9341 Metabolic encephalopathy: Secondary | ICD-10-CM | POA: Diagnosis not present

## 2022-12-09 LAB — GLUCOSE, CAPILLARY
Glucose-Capillary: 96 mg/dL (ref 70–99)
Glucose-Capillary: 76 mg/dL (ref 70–99)
Glucose-Capillary: 67 mg/dL — ABNORMAL LOW (ref 70–99)
Glucose-Capillary: 163 mg/dL — ABNORMAL HIGH (ref 70–99)
Glucose-Capillary: 99 mg/dL (ref 70–99)
Glucose-Capillary: 93 mg/dL (ref 70–99)
Glucose-Capillary: 111 mg/dL — ABNORMAL HIGH (ref 70–99)

## 2022-12-09 LAB — PROCALCITONIN: Procalcitonin: <0.1 ng/mL

## 2022-12-09 MED ORDER — DEXTROSE 50 % IV SOLN: 12.5000 g | INTRAVENOUS | Status: AC

## 2022-12-09 MED ORDER — DEXTROSE-SODIUM CHLORIDE 5-0.9 % IV SOLN: INTRAVENOUS | Status: AC

## 2022-12-09 MED ORDER — FREE WATER
30.0000 mL | Status: DC
  Administered 2022-12-09 – 2022-12-10 (×3): 30 mL

## 2022-12-09 MED ORDER — DEXTROSE 50 % IV SOLN
INTRAVENOUS | Status: AC
  Administered 2022-12-09: 12.5 g via INTRAVENOUS
  Filled 2022-12-09: qty 50

## 2022-12-09 MED ORDER — OSMOLITE 1.2 CAL PO LIQD
1000.0000 mL | ORAL | Status: DC
  Administered 2022-12-09 – 2022-12-12 (×3): 1000 mL

## 2022-12-09 MED ORDER — ORAL CARE MOUTH RINSE
15.0000 mL | OROMUCOSAL | Status: DC
  Administered 2022-12-09 – 2022-12-14 (×19): 15 mL via OROMUCOSAL

## 2022-12-09 MED ORDER — ORAL CARE MOUTH RINSE: 15.0000 mL | OROMUCOSAL | Status: DC | PRN

## 2022-12-09 NOTE — Telephone Encounter (Signed)
CT ordered under Dr Katrinka Blazing (to be done prior to 6 week post op).

## 2022-12-09 NOTE — Progress Notes (Signed)
Progress Note   Patient: Christine Hull ION:[id number] DOB: 06/29/1950 DOA: 12/04/2022     5 DOS: the patient was seen and examined on 12/09/2022   Brief hospital course: 72 year old female with past medical history of hypothyroidism, hypertension, prior stroke with vascular dementia brought into the ED after decreased mental status and not walking as much.  She had a fall around a week ago.  In the ER found to have bilateral subdural hematomas with resultant 4 mm midline shift to the left and possible subarachnoid hemorrhage.  Bilateral burr hole procedure done by neurosurgery on 11/25.   11/26. Decrease in size of bilateral subdural collections 5 mm on the right (was 1.5 cm previously), 6 mm on the left (was previously 1.1 cm).  Resolution of midline shift. 11/27.  Patient is evaluated by speech therapy, still not safe to for oral intake.  Place NG tube start tube feeding.   Principal Problem:   Traumatic subdural hematoma without loss of consciousness (HCC) Active Problems:   Traumatic subarachnoid hemorrhage (HCC)   Acute metabolic encephalopathy   Hypothyroidism   VAD (vascular dementia) (HCC)   Essential hypertension   Sinus bradycardia   History of fall   History of CVA (cerebrovascular accident)   Assessment and Plan: * Traumatic subdural hematoma without loss of consciousness (HCC) Traumatic subarachnoid hemorrhage History of fall with head strike 2 weeks prior to admission Bilateral burr hole procedure done on 11/25.  Decrease in the amount of blood on subdural hematomas and resolution of midline shift.  Patient with bilateral drains.  Patient has been seen by speech therapy, still not safe for any p.o. intake, given her altered mental status.  Discussed with the patient  husband, will start NG tube feeding.  Acute metabolic encephalopathy VAD (vascular dementia) (HCC) History of stroke. Failure to thrive. Patient has severe dementia at baseline, she is nonverbal.   She has a worsening mental status since admission.  Her dementia probably will be getting worse after brain bleed.  Long-term prognosis very poor, will obtain palliative care consult. Discussed about CODE STATUS, husband still want patient to be full code.  Hypoglycemia secondary to poor p.o. intake. Continue D5 infusion.  NG tube for feeding.  Sinus bradycardia Not on any rate controlling medications.  Essential hypertension Continued losartan at lower dose.  Hypothyroidism Continue levothyroxine        Subjective:  Patient is not responding, not follow commands.  Physical Exam: Vitals:   12/09/22 0800 12/09/22 0930 12/09/22 1000 12/09/22 1200  BP: 108/89 113/62 122/81 127/70  Pulse: 61 69 65 60  Resp: 15 17 20 20   Temp:    97.9 F (36.6 C)  TempSrc:    Axillary  SpO2: 100% 97% 95% 98%  Weight:      Height:       General exam: ill appearing Respiratory system: Clear to auscultation. Respiratory effort normal. Cardiovascular system: S1 & S2 heard, RRR. No JVD, murmurs, rubs, gallops or clicks. No pedal edema. Gastrointestinal system: Abdomen is nondistended, soft and nontender. No organomegaly or masses felt. Normal bowel sounds heard. Central nervous system: Alert, not following commands. Extremities: Symmetric 5 x 5 power. Skin: No rashes, lesions or ulcers Psychiatry: Flat affect  Data Reviewed:  Reviewed CT scan results,  lab results  Family Communication: Husband updated over the phone.  Disposition: Status is: Inpatient Remains inpatient appropriate because: Severity of disease, altered mental status, IV treatment.     Time spent: 55 minutes  Author: Marrion Coy,  MD 12/09/2022 2:08 PM  For on call review [email or web address].

## 2022-12-09 NOTE — Progress Notes (Signed)
Initial Nutrition Assessment  DOCUMENTATION CODES:   Severe malnutrition in context of social or environmental circumstances  INTERVENTION:   Once NGT in place, recommend:   Osmolite 1.2@55ml /hr- Initiate at 82ml/hr and increase by 103ml/hr q 8 hours until goal rate is reached.   Free water flushes 30ml q4 hours to maintain tube patency   Regimen provides 1584kcal/day, 73g/day protein and 1261ml/day of free water.   Pt at high refeed risk; recommend monitor potassium, magnesium and phosphorus labs daily until stable  Daily weights   NUTRITION DIAGNOSIS:   Severe Malnutrition related to social / environmental circumstances as evidenced by severe fat depletion, severe muscle depletion.  GOAL:   Patient will meet greater than or equal to 90% of their needs  MONITOR:   Diet advancement, Labs, Weight trends, TF tolerance, Skin, I & O's  REASON FOR ASSESSMENT:   Consult Enteral/tube feeding initiation and management  ASSESSMENT:   72 y/o female with h/o hypothyroidism, HTN, HLD, CVA, mood disorder and dementia who is admitted with chronic bilateral subdural hematoma now s/p right & left sided burr holes for drainage of subdural hematoma 11/25.  Visited pt's room today. Pt is unable to provide any nutrition related history. Per chart review, it sounds like pt is non-verbal at baseline and mainly wheelchair bound. Per chart, pt appears to be down ~40lbs over the past several years. Per chart, pt is down 6lbs(6%) since October. Pt seen by SLP who recommended NPO. Plan is for NGT placement and nutrition support. Pt is at high refeed risk. Pt does appear weight stable since admission.   Medications reviewed and include: synthroid  Labs reviewed: K 3.9 wnl, P 2.9 wnl, Mg 2.0 wnl Cbgs- 67, 76, 96 x 24 hrs   Drains-   NUTRITION - FOCUSED PHYSICAL EXAM:  Flowsheet Row Most Recent Value  Orbital Region Moderate depletion  Upper Arm Region Severe depletion  Thoracic and  Lumbar Region Severe depletion  Buccal Region Moderate depletion  Temple Region Moderate depletion  Clavicle Bone Region Severe depletion  Clavicle and Acromion Bone Region Severe depletion  Scapular Bone Region Moderate depletion  Dorsal Hand Unable to assess  Patellar Region Severe depletion  Anterior Thigh Region Severe depletion  Posterior Calf Region Severe depletion  Edema (RD Assessment) None  Hair Reviewed  Eyes Reviewed  Mouth Reviewed  Skin Reviewed  Nails Reviewed   Diet Order:   Diet Order             Diet NPO time specified  Diet effective now                  EDUCATION NEEDS:   No education needs have been identified at this time  Skin:  Skin Assessment: Reviewed RN Assessment (incision head)  Last BM:  pta  Height:   Ht Readings from Last 1 Encounters:  12/07/22 5\' 5"  (1.651 m)    Weight:   Wt Readings from Last 1 Encounters:  12/09/22 54.3 kg    Ideal Body Weight:  56.8 kg  BMI:  Body mass index is 19.92 kg/m.  Estimated Nutritional Needs:   Kcal:  1400-1600kcal/day  Protein:  70-80g/day  Fluid:  1.4-1.6L/day  Betsey Holiday MS, RD, LDN Please refer to [facility] for RD and/or RD on-call/weekend/after hours pager

## 2022-12-09 NOTE — Progress Notes (Signed)
Neurosurgery Progress Note  History: Christine Hull POD 2 s/p bilateral burr holes for  bilateral subdural hematomas with symptomatic mass effect.   Per history, baseline is Ox2.  She is not communicative this morning, but appears comfortable.   Physical Exam: Vitals:   12/09/22 0800 12/09/22 0930  BP: 108/89 113/62  Pulse: 61 69  Resp: 15 17  Temp:    SpO2: 100% 97%    OE to voice, but does not follow commands.  Nonverbal.   Intermittently mumbles Moves all extremities x4 symmetrically  Dressings clean and dry. Drains in place.  Data:  Output by Drain (mL) 12/07/22 0701 - 12/07/22 1900 12/07/22 1901 - 12/08/22 0700 12/08/22 0701 - 12/08/22 1900 12/08/22 1901 - 12/09/22 0700 12/09/22 0701 - 12/09/22 1126  Closed System Drain 1 Lateral;Right Scalp Bulb (JP) 10 110 70 30 5  Closed System Drain 1 Lateral;Left Scalp Bulb (JP) 50 80 60 25 20    CT head without contrast: IMPRESSION: Motion degraded exam.   1. Interval postsurgical changes from bilateral burr hole craniotomies and placement of subdural drainage catheters. The right-sided drainage catheter terminates in the craniotomy defect. 2. Postoperative pneumocephalus causes mass effect on the bilateral frontal lobes resulting in a somewhat peaked appearance of the bilateral frontal lobes. While nonspecific, this appearance could be seen in the setting of tension pneumocephalus, which is not excluded. 3. Interval decrease in size of bilateral subdural collections which now measure up to 5 mm on the right and 6 mm on the left, previously 1.5 cm on the right 1.1 cm on the left. Interval resolution of midline shift. 4. Slight interval increase in size of the right temporal horn, which may be secondary to decreased mass effect, but early hydrocephalus is not entirely excluded. Recommend attention on follow up.   Assessment/Plan:  Christine Hull POD 2 s/p bilateral burr holes for  bilateral subdural hematomas with  symptomatic mass effect.   -Continue plan per primary ICU team -CT head showing interval decrease in size of bilateral subdural collections and resolution of midline shift. Pneumocephalus noted and should resolve over time -Drains to remain in place, will continue to monitor output -No additional intervention at this time  [name], [facility] Neurosurgery

## 2022-12-09 NOTE — Progress Notes (Signed)
Speech Language Pathology Treatment: Dysphagia  Patient Details Name: Christine Hull MRN: [id number] DOB: 1950-03-27 Today's Date: 12/09/2022 Time: 8295-6213 SLP Time Calculation (min) (ACUTE ONLY): 45 min  Assessment / Plan / Recommendation Clinical Impression  Pt seen for ongoing assessment of swallowing today; Family present in room. MD requesting determination of pt's ability to safely consume oral intake. Pt was made NPO on 12/06/22 s/p BSE results revealing no obvious response to presentation of po's.  Noted Neurosurgery notes s/p bilateral burr hole surgery for bilateral subdural hematomas with symptomatic mass effect.  Per report, pt is mostly nonverbal at baseline in setting of prior stroke with Vascular Dementia.  At this session, pt was given MAX verbal/tactile/visual stimulation by this SLP and NSG w/ no eye opening nor indication of awareness of Staff. Pt was given oral care via swabs w/ aversive behavior then low neuro response behaviors noted to the presentation of swabs: clamped mouth, then tonic biting on swabs and sucking on swabs.  W/ no further engagement despite the cues and encouragement, NO po trials were attempted as pt was NOT alert nor engaged to safely take po's. Evaluation ended. Education provided to Family present on her presentation of Dysphagia and risk for aspiration/aspiration pneumonia; need for discussion of alternative means of feeding.   Pt appears to present w/ oropharyngeal phase Dysphagia in setting of baseline Dementia, stroke and now new subdural hematomas w/ evacuation. HIGH risk for aspiration/aspiration pneumonia if given po's. Recommend NPO status w/ frequent oral care when receptive for hygiene and stimulation of swallowing; aspiration precautions.  ST services can be available for reconsult when pt's medical and alert status' have improved for participation in BSE/po intake. Recommend discussion w/ Palliative Care for GOC including alternative  means of feeding; Dietician f/u. MD/NSG updated an agreed.        HPI HPI: Christine Hull is a 72 y.o. female with who presented to ED following 2-week hx of shuffling gait, increased somnolence, and decreased ability to participate in ADLs. Symptoms started following an unwitnessed fall with head strike. Pt medical history significant for hypothyroidism, hypertension, prior stroke with vascular dementia. Head CT, "1. Bilateral holo hemispheric subdural hematomas (likely acute on  subacute), right greater than left. There is resultant 4 mm of  midline shift to the left.  2. Likely small amount of subarachnoid hemorrhage in the left  temporal lobe in the middle cranial fossa.".  Per Neurosurgery note: POD 2 s/p bilateral burr holes for  bilateral subdural hematomas with symptomatic mass effect today.      SLP Plan  Discharge SLP treatment due to (comment) (TBD)      Recommendations for follow up therapy are one component of a multi-disciplinary discharge planning process, led by the attending physician.  Recommendations may be updated based on patient status, additional functional criteria and insurance authorization.    Recommendations  Diet recommendations: NPO Medication Administration: Via alternative means                 (Palliative Care; Dietician) Oral care QID;Staff/trained caregiver to provide oral care   Frequent or constant Supervision/Assistance Dysphagia, oropharyngeal phase (R13.12) (baseline Dementia; new subdural hematomas w/ evacuation)     Discharge SLP treatment due to (comment) (TBD)       Christine Som, MS, CCC-SLP Speech Language Pathologist Rehab Services; [facility] [place] [phone] (ascom) Christine Hull  12/09/2022, 4:11 PM

## 2022-12-09 NOTE — Progress Notes (Signed)
CBG monitor not crossing over into epic. CBG 163 following admin. of 12.5 g dextrose 50%

## 2022-12-09 NOTE — Plan of Care (Signed)
Problem: Clinical Measurements: Goal: Respiratory complications will improve Outcome: Progressing Goal: Cardiovascular complication will be avoided Outcome: Progressing   Problem: Elimination: Goal: Will not experience complications related to urinary retention Outcome: Progressing

## 2022-12-10 DIAGNOSIS — Z8673 Personal history of transient ischemic attack (TIA), and cerebral infarction without residual deficits: Secondary | ICD-10-CM | POA: Diagnosis not present

## 2022-12-10 DIAGNOSIS — G9341 Metabolic encephalopathy: Secondary | ICD-10-CM | POA: Diagnosis not present

## 2022-12-10 DIAGNOSIS — S065X0D Traumatic subdural hemorrhage without loss of consciousness, subsequent encounter: Secondary | ICD-10-CM | POA: Diagnosis not present

## 2022-12-10 DIAGNOSIS — F01C18 Vascular dementia, severe, with other behavioral disturbance: Secondary | ICD-10-CM | POA: Diagnosis not present

## 2022-12-10 LAB — COMPREHENSIVE METABOLIC PANEL
ALT: 23 U/L (ref 0–44)
AST: 29 U/L (ref 15–41)
Albumin: 2.4 g/dL — ABNORMAL LOW (ref 3.5–5.0)
Alkaline Phosphatase: 110 U/L (ref 38–126)
Anion gap: 9 (ref 5–15)
BUN: 9 mg/dL (ref 8–23)
CO2: 22 mmol/L (ref 22–32)
Calcium: 8.2 mg/dL — ABNORMAL LOW (ref 8.9–10.3)
Chloride: 107 mmol/L (ref 98–111)
Creatinine, Ser: 0.57 mg/dL (ref 0.44–1.00)
GFR, Estimated: >60 mL/min (ref >60)
Glucose, Bld: 122 mg/dL — ABNORMAL HIGH (ref 70–99)
Potassium: 3.2 mmol/L — ABNORMAL LOW (ref 3.5–5.1)
Sodium: 138 mmol/L (ref 135–145)
Total Bilirubin: 0.9 mg/dL (ref <1.2)
Total Protein: 6.7 g/dL (ref 6.5–8.1)

## 2022-12-10 LAB — GLUCOSE, CAPILLARY
Glucose-Capillary: 121 mg/dL — ABNORMAL HIGH (ref 70–99)
Glucose-Capillary: 115 mg/dL — ABNORMAL HIGH (ref 70–99)
Glucose-Capillary: 103 mg/dL — ABNORMAL HIGH (ref 70–99)
Glucose-Capillary: 119 mg/dL — ABNORMAL HIGH (ref 70–99)
Glucose-Capillary: 120 mg/dL — ABNORMAL HIGH (ref 70–99)

## 2022-12-10 LAB — MAGNESIUM: Magnesium: 1.9 mg/dL (ref 1.7–2.4)

## 2022-12-10 LAB — PHOSPHORUS: Phosphorus: 2.2 mg/dL — ABNORMAL LOW (ref 2.5–4.6)

## 2022-12-10 MED ORDER — ENOXAPARIN SODIUM 40 MG/0.4ML IJ SOSY
40.0000 mg | PREFILLED_SYRINGE | INTRAMUSCULAR | Status: DC
  Administered 2022-12-10 – 2022-12-11 (×2): 40 mg via SUBCUTANEOUS
  Filled 2022-12-10 (×2): qty 0.4

## 2022-12-10 MED ORDER — LEVOTHYROXINE SODIUM 50 MCG PO TABS
50.0000 ug | ORAL_TABLET | Freq: Every day | ORAL | Status: DC
  Administered 2022-12-11 – 2022-12-28 (×17): 50 ug
  Filled 2022-12-10 (×17): qty 1

## 2022-12-10 MED ORDER — SERTRALINE HCL 50 MG PO TABS
50.0000 mg | ORAL_TABLET | Freq: Every day | ORAL | Status: DC
  Administered 2022-12-10 – 2022-12-28 (×17): 50 mg
  Filled 2022-12-10 (×17): qty 1

## 2022-12-10 MED ORDER — ACETAMINOPHEN 325 MG PO TABS
650.0000 mg | ORAL_TABLET | Freq: Four times a day (QID) | ORAL | Status: DC | PRN
  Administered 2022-12-11 – 2022-12-28 (×8): 650 mg
  Filled 2022-12-10 (×8): qty 2

## 2022-12-10 MED ORDER — FREE WATER
30.0000 mL | Status: DC
  Administered 2022-12-10 – 2022-12-13 (×18): 30 mL

## 2022-12-10 MED ORDER — ACETAMINOPHEN 650 MG RE SUPP: 650.0000 mg | Freq: Four times a day (QID) | RECTAL | Status: DC | PRN

## 2022-12-10 MED ORDER — LOSARTAN POTASSIUM 50 MG PO TABS
50.0000 mg | ORAL_TABLET | Freq: Every day | ORAL | Status: DC
  Administered 2022-12-11 – 2022-12-12 (×2): 50 mg
  Filled 2022-12-10 (×2): qty 1

## 2022-12-10 MED ORDER — POTASSIUM CHLORIDE 10 MEQ/100ML IV SOLN
10.0000 meq | INTRAVENOUS | Status: AC
  Administered 2022-12-10 (×2): 10 meq via INTRAVENOUS
  Filled 2022-12-10 (×2): qty 100

## 2022-12-10 NOTE — Progress Notes (Signed)
Neurosurgery Progress Note  History: Christine Hull POD 3 s/p bilateral burr holes for  bilateral subdural hematomas with symptomatic mass effect.   Per history, baseline is Ox2.    24-hour events: 11/28: Patient appears at baseline with eyes open and nonverbal, she is moving extremities.  Her drains do have some continued output.   Physical Exam: Vitals:   12/10/22 0900 12/10/22 1000  BP: (!) 111/95 104/82  Pulse: 80 77  Resp: 15 15  Temp:    SpO2: 93% 95%    Eyes open spontaneously, but does not follow commands.  Nonverbal.   Intermittently mumbles Moves all extremities x4 symmetrically  Dressings clean and dry. Drains in place.  Data:  Output by Drain (mL) 12/08/22 0701 - 12/08/22 1900 12/08/22 1901 - 12/09/22 0700 12/09/22 0701 - 12/09/22 1900 12/09/22 1901 - 12/10/22 0700 12/10/22 0701 - 12/10/22 1125  Closed System Drain 1 Lateral;Right Scalp Bulb (JP) 70 30 5 8    Closed System Drain 1 Lateral;Left Scalp Bulb (JP) 60 25 20 8      CT head without contrast: IMPRESSION: Motion degraded exam.   1. Interval postsurgical changes from bilateral burr hole craniotomies and placement of subdural drainage catheters. The right-sided drainage catheter terminates in the craniotomy defect. 2. Postoperative pneumocephalus causes mass effect on the bilateral frontal lobes resulting in a somewhat peaked appearance of the bilateral frontal lobes. While nonspecific, this appearance could be seen in the setting of tension pneumocephalus, which is not excluded. 3. Interval decrease in size of bilateral subdural collections which now measure up to 5 mm on the right and 6 mm on the left, previously 1.5 cm on the right 1.1 cm on the left. Interval resolution of midline shift. 4. Slight interval increase in size of the right temporal horn, which may be secondary to decreased mass effect, but early hydrocephalus is not entirely excluded. Recommend attention on follow  up.   Assessment/Plan:  Christine Hull POD 3 s/p bilateral burr holes for  bilateral subdural hematomas with symptomatic mass effect.   -Continue plan per primary ICU team -CT head on 11/26 showing interval decrease in size of bilateral subdural collections and resolution of midline shift. Pneumocephalus noted and should resolve over time -Drains to remain in place, will continue to monitor output, can remove on 11/29 -No additional intervention at this time -Okay for DVT prophylaxis  Whitney Post, MD

## 2022-12-10 NOTE — Plan of Care (Signed)
  Problem: Cardiac: Goal: Will achieve and/or maintain adequate cardiac output Outcome: Progressing   Problem: Clinical Measurements: Goal: Respiratory complications will improve Outcome: Progressing Goal: Cardiovascular complication will be avoided Outcome: Progressing   Problem: Nutrition: Goal: Adequate nutrition will be maintained Outcome: Progressing  Tolerating tube feeds. Able to increase to 35ml/hr per orders. Blood sugars stable

## 2022-12-10 NOTE — Progress Notes (Addendum)
Progress Note   Patient: Christine Hull BPZ:[id number] DOB: 06-20-1950 DOA: 12/04/2022     6 DOS: the patient was seen and examined on 12/10/2022   Brief hospital course: 72 year old female with past medical history of hypothyroidism, hypertension, prior stroke with vascular dementia brought into the ED after decreased mental status and not walking as much.  She had a fall around a week ago.  In the ER found to have bilateral subdural hematomas with resultant 4 mm midline shift to the left and possible subarachnoid hemorrhage.  Bilateral burr hole procedure done by neurosurgery on 11/25.   11/26. Decrease in size of bilateral subdural collections 5 mm on the right (was 1.5 cm previously), 6 mm on the left (was previously 1.1 cm).  Resolution of midline shift. 11/27.  Patient is evaluated by speech therapy, still not safe to for oral intake.  Place NG tube start tube feeding.   Principal Problem:   Traumatic subdural hematoma without loss of consciousness (HCC) Active Problems:   Traumatic subarachnoid hemorrhage (HCC)   Acute metabolic encephalopathy   Hypothyroidism   VAD (vascular dementia) (HCC)   Essential hypertension   Sinus bradycardia   History of fall   History of CVA (cerebrovascular accident)   Protein-calorie malnutrition, severe   Assessment and Plan: * Traumatic subdural hematoma without loss of consciousness (HCC) Traumatic subarachnoid hemorrhage History of fall with head strike 2 weeks prior to admission Bilateral burr hole procedure done on 11/25.  Decrease in the amount of blood on subdural hematomas and resolution of midline shift.  Patient with bilateral drains.  Patient has been seen by speech therapy, still not safe for any p.o. intake, given her altered mental status.  NG tube was placed on 11/27 after discussion with husband, tube feeding started.  Patient seem to be tolerating well.  Patient had a low-grade fever last night with temperature 100.2, chest  x-ray did not show any pneumonia.  Procalcitonin level less than 0.1, most likely this is secondary to trauma from subdural hematoma.  No antibiotics needed. Still has drains from subdural hematoma.  Followed by neurosurgery.  Will continue monitor closely in the stepdown unit.   acute metabolic encephalopathy VAD (vascular dementia) (HCC) History of stroke. Failure to thrive. Patient has severe dementia at baseline, she is nonverbal.  She has a worsening mental status since admission.  Her dementia probably will be getting worse after brain bleed.  Long-term prognosis very poor, palliative care consult obtained. NG tube placed, tolerating tube feeding.  Hypokalemia. Replete with IV KCl.    Hypoglycemia secondary to poor p.o. intake. Was better after starting tube feeding, discontinue IV fluids.   Sinus bradycardia Improved.   Essential hypertension Continued losartan at lower dose.   Hypothyroidism Continue levothyroxine      Subjective:  Patient is more responsive today, tracking voice.  Tolerating tube feeding.  Physical Exam: Vitals:   12/10/22 0700 12/10/22 0800 12/10/22 0900 12/10/22 1000  BP: 102/70 101/75 (!) 111/95 104/82  Pulse: 83 78 80 77  Resp: (!) 22 17 15  (!) 29  Temp:  97.8 F (36.6 C)    TempSrc:  Axillary    SpO2: 98% 96% 93% 95%  Weight:      Height:       General exam: Appears calm and comfortable  Respiratory system: Clear to auscultation. Respiratory effort normal. Cardiovascular system: S1 & S2 heard, RRR. No JVD, murmurs, rubs, gallops or clicks. No pedal edema. Gastrointestinal system: Abdomen is nondistended, soft and  nontender. No organomegaly or masses felt. Normal bowel sounds heard. Central nervous system: Alert and responsive by tracking voice. Extremities: Symmetric 5 x 5 power. Skin: No rashes, lesions or ulcers Psychiatry: Flat affect.   Data Reviewed:  Lab results reviewed.  Family Communication: Husband updated at  bedside, all questions answered.  Disposition: Status is: Inpatient Remains inpatient appropriate because: Severity of disease,     Time spent: 55 minutes  Author: Marrion Coy, MD 12/10/2022 10:24 AM  For on call review [email or web address].

## 2022-12-10 NOTE — Plan of Care (Signed)
  Problem: Clinical Measurements: Goal: Respiratory complications will improve Outcome: Progressing Goal: Cardiovascular complication will be avoided Outcome: Progressing   Problem: Nutrition: Goal: Adequate nutrition will be maintained Outcome: Progressing   Problem: Safety: Goal: Ability to remain free from injury will improve Outcome: Progressing   Problem: Skin Integrity: Goal: Risk for impaired skin integrity will decrease Outcome: Progressing

## 2022-12-10 NOTE — Consult Note (Signed)
Consultation Note Date: 12/10/2022   Patient Name: Christine Hull  DOB: Sep 22, 1950  MRN: [id number]  Age / Sex: 72 y.o., female  PCP: Annita Brod, MD Referring Physician: Marrion Coy, MD  Reason for Consultation: Establishing goals of care  HPI/Patient Profile: 72 y.o. female  with past medical history of hypothyroidism, hypertension, and prior stroke with vascular dementia admitted on 12/04/2022 with AMS and decreased function. Found to have bilateral subdural hematomas with resultant 4 mm midline shift to the left and possible subarachnoid hemorrhage. Bilateral burr hole procedure done by neurosurgery on 11/25.   11/26. Decrease in size of bilateral subdural collections 5 mm on the right (was 1.5 cm previously), 6 mm on the left (was previously 1.1 cm).  Resolution of midline shift. 11/27.  Patient is evaluated by speech therapy, still not safe to for oral intake.  Place NG tube start tube feeding. PMT consulted to discuss GOC.   Clinical Assessment and Goals of Care: I have reviewed medical records including EPIC notes, labs and imaging, received report from RN, assessed the patient and then met with spouse  to discuss diagnosis prognosis, GOC, EOL wishes, disposition and options.  I introduced Palliative Medicine as specialized medical care for people living with serious illness. It focuses on providing relief from the symptoms and stress of a serious illness. The goal is to improve quality of life for both the patient and the family.  Spouse tells me prior to current situation patient was ambulatory. She needed assistance with all ADLs. He tells me had a good appetite. Her tells me her speech was unintelligible. They had an aid in the home that helped with her care.    We discussed patient's current illness and what it means in the larger context of patient's on-going co-morbidities.  Natural disease trajectory and expectations at EOL  were discussed. Spouse understands and has been well informed by care team - has no questions.   I attempted to elicit values and goals of care important to the patient.    The difference between aggressive medical intervention and comfort care was considered in light of the patient's goals of care. Spouse tells me they are open to whatever care is offered to prolong life. He tells me he has had multiple family members in the past receiving critical care and so he understands what could happen. Specifically he tells me they are open to CPR, ventilators, and PEG tube if needed.   Discussed with spouse the importance of continued conversation with family and the medical providers regarding overall plan of care and treatment options, ensuring decisions are within the context of the patient's values and GOCs.    Questions and concerns were addressed. The family was encouraged to call with questions or concerns.    Primary Decision Maker NEXT OF KIN - spouse Shantavious Alger    SUMMARY OF RECOMMENDATIONS   Family shares they are open to any care offered to prolong life including CPR, ventilators, PEG tubes  Code Status/Advance Care Planning: Full code     Primary Diagnoses: Present on Admission:  Traumatic subdural hematoma without loss of consciousness (HCC)  Hypothyroidism  Essential hypertension  VAD (vascular dementia) (HCC)  Sinus bradycardia   I have reviewed the medical record, interviewed the patient and family, and examined the patient. The following aspects are pertinent.  Past Medical History:  Diagnosis Date   Dementia (HCC)    Heart murmur    Hypertension    Hypothyroidism    [employer]  LINQ 05/09/2013   Near syncope 02/02/2013   Prolonged QT interval 02/02/2013   Sinus bradycardia 05/09/2013   Social History   Socioeconomic History   Marital status: Married    Spouse name: Not on file   Number of children: Not on file   Years of education: Not on file   Highest  education level: Not on file  Occupational History   Not on file  Tobacco Use   Smoking status: Every Day    Current packs/day: 0.50    Average packs/day: 0.5 packs/day for 41.0 years (20.5 ttl pk-yrs)    Types: Cigarettes   Smokeless tobacco: Never  Substance and Sexual Activity   Alcohol use: Yes    Alcohol/week: 5.0 standard drinks of alcohol    Types: 5 Shots of liquor per week    Comment: 02/02/2013 "drink a shot  in my coffee ~ nightly before bed"   Drug use: No   Sexual activity: Not Currently  Other Topics Concern   Not on file  Social History Narrative   Not on file   Social Determinants of Health   Financial Resource Strain: Not on file  Food Insecurity: Patient Unable To Answer (12/06/2022)   Hunger Vital Sign    Worried About Running Out of Food in the Last Year: Patient unable to answer    Ran Out of Food in the Last Year: Patient unable to answer  Transportation Needs: Patient Unable To Answer (12/06/2022)   PRAPARE - Administrator, Civil Service (Medical): Patient unable to answer    Lack of Transportation (Non-Medical): Patient unable to answer  Physical Activity: Not on file  Stress: Not on file  Social Connections: Not on file   Family History  Problem Relation Age of Onset   Early death Mother        stroke resulting from prior head injury   Stroke Mother    Early death Father        MVA   Cancer Maternal Aunt        stomach cancer   Breast cancer Neg Hx    Scheduled Meds:  Chlorhexidine Gluconate Cloth  6 each Topical Daily   free water  30 mL Per Tube Q4H   [START ON 12/11/2022] levothyroxine  50 mcg Per Tube Q0600   losartan  50 mg Per Tube Daily   mouth rinse  15 mL Mouth Rinse 4 times per day   sertraline  50 mg Per Tube Daily   sodium chloride flush  3 mL Intravenous Q12H   Continuous Infusions:  feeding supplement (OSMOLITE 1.2 CAL) 35 mL/hr at 12/10/22 0404   levETIRAcetam 500 mg (12/10/22 0639)   PRN  Meds:.acetaminophen **OR** acetaminophen, mouth rinse No Known Allergies Review of Systems  Unable to perform ROS: Patient nonverbal    Physical Exam Constitutional:      General: She is not in acute distress.    Appearance: She is ill-appearing.     Comments: Opens eyes to voice, focuses for brief periods  Pulmonary:     Effort: Pulmonary effort is normal.  Skin:    General: Skin is warm and dry.  Neurological:     Comments: nonverbal     Vital Signs: BP 113/85   Pulse 66   Temp 97.8 F (36.6 C) (Axillary)   Resp 12   Ht 5\' 5"  (1.651 m)   Wt 54.8 kg   SpO2 97%   BMI 20.10 kg/m  Pain Scale: PAINAD  Pain Score: 0-No pain   SpO2: SpO2: 97 % O2 Device:SpO2: 97 % O2 Flow Rate: .O2 Flow Rate (L/min): 8 L/min  IO: Intake/output summary:  Intake/Output Summary (Last 24 hours) at 12/10/2022 1233 Last data filed at 12/10/2022 0800 Gross per 24 hour  Intake 1134.6 ml  Output 566 ml  Net 568.6 ml    LBM: Last BM Date :  (PTA) Baseline Weight: Weight: 54.8 kg Most recent weight: Weight: 54.8 kg      *Please note that this is a verbal dictation therefore any spelling or grammatical errors are due to the "[employer]agon [employer] One" system interpretation.   Time Total: 40 minutes Time spent includes: Detailed review of medical records (labs, imaging, vital signs), medically appropriate exam, discussion with treatment team, counseling and educating patient, family and/or staff, documenting clinical information, medication management and coordination of care.    Gerlean Ren, DNP, AGNP-C Palliative Medicine Team [phone] Pager: [phone]

## 2022-12-11 DIAGNOSIS — F01C18 Vascular dementia, severe, with other behavioral disturbance: Secondary | ICD-10-CM | POA: Diagnosis not present

## 2022-12-11 DIAGNOSIS — I10 Essential (primary) hypertension: Secondary | ICD-10-CM | POA: Diagnosis not present

## 2022-12-11 DIAGNOSIS — S065X0D Traumatic subdural hemorrhage without loss of consciousness, subsequent encounter: Secondary | ICD-10-CM | POA: Diagnosis not present

## 2022-12-11 LAB — COMPREHENSIVE METABOLIC PANEL
ALT: 20 U/L (ref 0–44)
AST: 20 U/L (ref 15–41)
Albumin: 2.4 g/dL — ABNORMAL LOW (ref 3.5–5.0)
Alkaline Phosphatase: 111 U/L (ref 38–126)
Anion gap: 9 (ref 5–15)
BUN: 11 mg/dL (ref 8–23)
CO2: 23 mmol/L (ref 22–32)
Calcium: 8.2 mg/dL — ABNORMAL LOW (ref 8.9–10.3)
Chloride: 103 mmol/L (ref 98–111)
Creatinine, Ser: 0.61 mg/dL (ref 0.44–1.00)
GFR, Estimated: >60 mL/min (ref >60)
Glucose, Bld: 158 mg/dL — ABNORMAL HIGH (ref 70–99)
Potassium: 3.6 mmol/L (ref 3.5–5.1)
Sodium: 135 mmol/L (ref 135–145)
Total Bilirubin: 0.8 mg/dL (ref <1.2)
Total Protein: 6.8 g/dL (ref 6.5–8.1)

## 2022-12-11 LAB — GLUCOSE, CAPILLARY
Glucose-Capillary: 135 mg/dL — ABNORMAL HIGH (ref 70–99)
Glucose-Capillary: 151 mg/dL — ABNORMAL HIGH (ref 70–99)
Glucose-Capillary: 163 mg/dL — ABNORMAL HIGH (ref 70–99)
Glucose-Capillary: 124 mg/dL — ABNORMAL HIGH (ref 70–99)
Glucose-Capillary: 174 mg/dL — ABNORMAL HIGH (ref 70–99)
Glucose-Capillary: 189 mg/dL — ABNORMAL HIGH (ref 70–99)

## 2022-12-11 LAB — MAGNESIUM: Magnesium: 1.9 mg/dL (ref 1.7–2.4)

## 2022-12-11 LAB — PHOSPHORUS: Phosphorus: 2.4 mg/dL — ABNORMAL LOW (ref 2.5–4.6)

## 2022-12-11 MED ORDER — LIOTHYRONINE SODIUM 5 MCG PO TABS
5.0000 ug | ORAL_TABLET | Freq: Every day | ORAL | Status: DC
  Administered 2022-12-12 – 2022-12-28 (×15): 5 ug
  Filled 2022-12-11 (×17): qty 1

## 2022-12-11 MED ORDER — POTASSIUM & SODIUM PHOSPHATES 280-160-250 MG PO PACK
1.0000 | PACK | Freq: Three times a day (TID) | ORAL | Status: AC
  Administered 2022-12-11 (×4): 1 via NASOGASTRIC
  Filled 2022-12-11 (×3): qty 1

## 2022-12-11 MED ORDER — THIAMINE HCL 100 MG PO TABS
100.0000 mg | ORAL_TABLET | Freq: Every day | ORAL | Status: AC
  Administered 2022-12-11 – 2022-12-17 (×7): 100 mg
  Filled 2022-12-11 (×13): qty 1

## 2022-12-11 NOTE — Progress Notes (Signed)
Neurosurgery Progress Note  History: Christine Hull POD 4 s/p bilateral burr holes for  bilateral subdural hematomas with symptomatic mass effect.   Per history, baseline is Ox2.    24-hour events: 11/29: Stable today. DVT prophylaxis started. Subdural drains removed today   Hospital Course 11/28: Patient appears at baseline with eyes open and nonverbal, she is moving extremities.  Her drains do have some continued output.   Physical Exam: Vitals:   12/11/22 1400 12/11/22 1500  BP: 113/86 (!) 115/91  Pulse: 97 92  Resp: (!) 23 (!) 30  Temp:    SpO2: 98% 98%    Eyes open spontaneously, but does not follow commands.  Nonverbal.   Intermittently mumbles Moves all extremities x4 symmetrically  Dressings clean and dry. Drains removed  Data:  Output by Drain (mL) 12/09/22 0701 - 12/09/22 1900 12/09/22 1901 - 12/10/22 0700 12/10/22 0701 - 12/10/22 1900 12/10/22 1901 - 12/11/22 0700 12/11/22 0701 - 12/11/22 1804  Closed System Drain 1 Lateral;Right Scalp Bulb (JP) 5 8 10     Closed System Drain 1 Lateral;Left Scalp Bulb (JP) 20 8 55 80     CT head without contrast: IMPRESSION: Motion degraded exam.   1. Interval postsurgical changes from bilateral burr hole craniotomies and placement of subdural drainage catheters. The right-sided drainage catheter terminates in the craniotomy defect. 2. Postoperative pneumocephalus causes mass effect on the bilateral frontal lobes resulting in a somewhat peaked appearance of the bilateral frontal lobes. While nonspecific, this appearance could be seen in the setting of tension pneumocephalus, which is not excluded. 3. Interval decrease in size of bilateral subdural collections which now measure up to 5 mm on the right and 6 mm on the left, previously 1.5 cm on the right 1.1 cm on the left. Interval resolution of midline shift. 4. Slight interval increase in size of the right temporal horn, which may be secondary to decreased mass  effect, but early hydrocephalus is not entirely excluded. Recommend attention on follow up.   Assessment/Plan:  Christine Hull POD 4 s/p bilateral burr holes for  bilateral subdural hematomas with symptomatic mass effect.   -Continue plan per primary ICU team -CT head on 11/26 showing interval decrease in size of bilateral subdural collections and resolution of midline shift. Pneumocephalus noted and should resolve over time -Drains removed 11/29 -Okay for DVT prophylaxis - Dispo per medical team  Lucy Chris, MD

## 2022-12-11 NOTE — TOC Progression Note (Signed)
Transition of Care [facility]) - Progression Note    Patient Details  Name: Christine Hull MRN: [id number] Date of Birth: [birth year]  [facility] of [facility]) CM/SW Contact  Truddie Hidden, RN Phone Number: 12/11/2022, 9:14 AM  Clinical Narrative:    TOC continuing to follow patient's progress throughout discharge planning.   Expected Discharge Plan:  (TBD) Barriers to Discharge: Continued Medical Work up  Expected Discharge Plan and Services       Living arrangements for the past 2 months: Single Family Home                                       Social Determinants of Health (SDOH) Interventions SDOH Screenings   Food Insecurity: Patient Unable To Answer (12/06/2022)  Housing: Patient Unable To Answer (12/06/2022)  Transportation Needs: Patient Unable To Answer (12/06/2022)  Utilities: Patient Unable To Answer (12/06/2022)  Tobacco Use: High Risk (12/07/2022)    Readmission Risk Interventions     No data to display

## 2022-12-11 NOTE — Progress Notes (Incomplete)
   12/11/22 1300  Spiritual Encounters  Type of Visit Initial  Care provided to: Pt and family  Referral source Chaplain assessment  Reason for visit Routine spiritual support  OnCall Visit No  Spiritual Framework  Patient Stress Factors Major life changes  Family Stress Factors Major life changes  Interventions  Spiritual Care Interventions Made Established relationship of care and support;Compassionate presence;Prayer  Intervention Outcomes  Outcomes Awareness of support;Connection to spiritual care  Spiritual Care Plan  Spiritual Care Issues Still Outstanding No further spiritual care needs at this time (see row info)   Rounding saw daughter and patient during rounding. Patient daughter as me to pray for her mother the Patient. I provided prayer and let them know that Spiritual Care is here 24/7 if they need to speak with someone.

## 2022-12-11 NOTE — Progress Notes (Signed)
Progress Note   Patient: Christine Hull WGN:[id number] DOB: October 17, 1950 DOA: 12/04/2022     7 DOS: the patient was seen and examined on 12/11/2022   Brief hospital course: 72 year old female with past medical history of hypothyroidism, hypertension, prior stroke with vascular dementia brought into the ED after decreased mental status and not walking as much.  She had a fall around a week ago.  In the ER found to have bilateral subdural hematomas with resultant 4 mm midline shift to the left and possible subarachnoid hemorrhage.  Bilateral burr hole procedure done by neurosurgery on 11/25.   11/26. Decrease in size of bilateral subdural collections 5 mm on the right (was 1.5 cm previously), 6 mm on the left (was previously 1.1 cm).  Resolution of midline shift. 11/27.  Patient is evaluated by speech therapy, still not safe to for oral intake.  Place NG tube start tube feeding. 11/29.  Neurosurgery plan to remove drain today.   Principal Problem:   Traumatic subdural hematoma without loss of consciousness (HCC) Active Problems:   Traumatic subarachnoid hemorrhage (HCC)   Acute metabolic encephalopathy   Hypothyroidism   VAD (vascular dementia) (HCC)   Hypokalemia   Essential hypertension   Sinus bradycardia   History of fall   History of CVA (cerebrovascular accident)   Protein-calorie malnutrition, severe   Hypophosphatemia   Assessment and Plan:  * Traumatic subdural hematoma without loss of consciousness (HCC) Traumatic subarachnoid hemorrhage History of fall with head strike 2 weeks prior to admission Bilateral burr hole procedure done on 11/25.  Decrease in the amount of blood on subdural hematomas and resolution of midline shift.  Patient with bilateral drains.  Patient has been seen by speech therapy, still not safe for any p.o. intake, given her altered mental status.  NG tube was placed on 11/27 after discussion with husband, tube feeding started.  Patient seem to be  tolerating well.  Patient had a low-grade fever the night of 11/27 with temperature 100.2, chest x-ray did not show any pneumonia.  Procalcitonin level less than 0.1, most likely this is secondary to trauma from subdural hematoma.  No antibiotics needed. Neurosurgery is planning to remove the drain today, patient probably can move out of ICU tomorrow.     acute metabolic encephalopathy VAD (vascular dementia) (HCC) History of stroke. Failure to thrive. Patient has severe dementia at baseline, she is nonverbal.  She has a worsening mental status since admission.  Her dementia probably will be getting worse after brain bleed.  Long-term prognosis very poor, palliative care consult obtained. Patient currently is tolerating tube feeding.  Monitor electrolytes.   Hypokalemia. Hypophosphatemia. Potassium normalized, developed mild hypophosphatemia, will supplement appear NG tube.    Hypoglycemia secondary to poor p.o. intake. Resolved after tolerating tube feeding.    Essential hypertension Continued losartan at lower dose.   Hypothyroidism Continue levothyroxine     Subjective:  Patient is nonverbal, seems to tracking voice.  Physical Exam: Vitals:   12/11/22 0500 12/11/22 0800 12/11/22 0900 12/11/22 1000  BP: 132/87 (!) 138/127 (!) 138/90 136/80  Pulse: 73 91 88 94  Resp: 20 (!) 21 (!) 22 (!) 21  Temp:  98.6 F (37 C)    TempSrc:  Oral    SpO2: 98% 100% 99% 100%  Weight:      Height:       General exam: Appears calm and comfortable  Respiratory system: Clear to auscultation. Respiratory effort normal. Cardiovascular system: S1 & S2 heard, RRR. No  JVD, murmurs, rubs, gallops or clicks. No pedal edema. Gastrointestinal system: Abdomen is nondistended, soft and nontender. No organomegaly or masses felt. Normal bowel sounds heard. Central nervous system: Alert and nonverbal.  Tracking voice. Extremities: Symmetric 5 x 5 power. Skin: No rashes, lesions or ulcers    Data  Reviewed:  Lab results reviewed.  Family Communication: None  Disposition: Status is: Inpatient Remains inpatient appropriate because: Severity of disease, ICU care.     Time spent: 35 minutes  Author: Marrion Coy, MD 12/11/2022 11:06 AM  For on call review [email or web address].

## 2022-12-12 ENCOUNTER — Inpatient Hospital Stay: Payer: Medicare Other

## 2022-12-12 DIAGNOSIS — G9341 Metabolic encephalopathy: Secondary | ICD-10-CM | POA: Diagnosis not present

## 2022-12-12 DIAGNOSIS — E871 Hypo-osmolality and hyponatremia: Secondary | ICD-10-CM | POA: Insufficient documentation

## 2022-12-12 DIAGNOSIS — F01C18 Vascular dementia, severe, with other behavioral disturbance: Secondary | ICD-10-CM | POA: Diagnosis not present

## 2022-12-12 DIAGNOSIS — S066X0A Traumatic subarachnoid hemorrhage without loss of consciousness, initial encounter: Secondary | ICD-10-CM | POA: Diagnosis not present

## 2022-12-12 LAB — GLUCOSE, CAPILLARY
Glucose-Capillary: 113 mg/dL — ABNORMAL HIGH (ref 70–99)
Glucose-Capillary: 117 mg/dL — ABNORMAL HIGH (ref 70–99)
Glucose-Capillary: 137 mg/dL — ABNORMAL HIGH (ref 70–99)
Glucose-Capillary: 140 mg/dL — ABNORMAL HIGH (ref 70–99)
Glucose-Capillary: 153 mg/dL — ABNORMAL HIGH (ref 70–99)
Glucose-Capillary: 160 mg/dL — ABNORMAL HIGH (ref 70–99)

## 2022-12-12 LAB — COMPREHENSIVE METABOLIC PANEL
ALT: 26 U/L (ref 0–44)
AST: 36 U/L (ref 15–41)
Albumin: 2.3 g/dL — ABNORMAL LOW (ref 3.5–5.0)
Alkaline Phosphatase: 145 U/L — ABNORMAL HIGH (ref 38–126)
Anion gap: 9 (ref 5–15)
BUN: 16 mg/dL (ref 8–23)
CO2: 23 mmol/L (ref 22–32)
Calcium: 8 mg/dL — ABNORMAL LOW (ref 8.9–10.3)
Chloride: 101 mmol/L (ref 98–111)
Creatinine, Ser: 0.68 mg/dL (ref 0.44–1.00)
GFR, Estimated: >60 mL/min (ref >60)
Glucose, Bld: 157 mg/dL — ABNORMAL HIGH (ref 70–99)
Potassium: 4.2 mmol/L (ref 3.5–5.1)
Sodium: 133 mmol/L — ABNORMAL LOW (ref 135–145)
Total Bilirubin: 1.3 mg/dL — ABNORMAL HIGH (ref <1.2)
Total Protein: 6.9 g/dL (ref 6.5–8.1)

## 2022-12-12 LAB — CBC
HCT: 33.7 % — ABNORMAL LOW (ref 36.0–46.0)
Hemoglobin: 11.4 g/dL — ABNORMAL LOW (ref 12.0–15.0)
MCH: 30.2 pg (ref 26.0–34.0)
MCHC: 33.8 g/dL (ref 30.0–36.0)
MCV: 89.4 fL (ref 80.0–100.0)
Platelets: 365 10*3/uL (ref 150–400)
RBC: 3.77 MIL/uL — ABNORMAL LOW (ref 3.87–5.11)
RDW: 13.1 % (ref 11.5–15.5)
WBC: 19.9 10*3/uL — ABNORMAL HIGH (ref 4.0–10.5)
nRBC: 0 % (ref 0.0–0.2)

## 2022-12-12 LAB — PHOSPHORUS: Phosphorus: 3 mg/dL (ref 2.5–4.6)

## 2022-12-12 LAB — MAGNESIUM: Magnesium: 2.3 mg/dL (ref 1.7–2.4)

## 2022-12-12 MED ORDER — PIPERACILLIN-TAZOBACTAM 3.375 G IVPB
3.3750 g | Freq: Three times a day (TID) | INTRAVENOUS | Status: AC
  Administered 2022-12-12 – 2022-12-17 (×16): 3.375 g via INTRAVENOUS
  Filled 2022-12-12 (×17): qty 50

## 2022-12-12 MED ORDER — SODIUM CHLORIDE 0.9 % IV SOLN
2.0000 g | INTRAVENOUS | Status: DC
  Administered 2022-12-12: 2 g via INTRAVENOUS
  Filled 2022-12-12: qty 20

## 2022-12-12 MED ORDER — LOSARTAN POTASSIUM 50 MG PO TABS
25.0000 mg | ORAL_TABLET | Freq: Every day | ORAL | Status: DC
  Administered 2022-12-13: 25 mg
  Filled 2022-12-12: qty 1

## 2022-12-12 MED ORDER — SODIUM CHLORIDE 0.9 % IV SOLN: INTRAVENOUS | Status: AC

## 2022-12-12 MED ORDER — SODIUM CHLORIDE 0.9 % IV SOLN: INTRAVENOUS | Status: DC

## 2022-12-12 NOTE — Progress Notes (Signed)
Speech Language Pathology Treatment: Dysphagia  Patient Details Name: Christine Hull MRN: [id number] DOB: 17-Sep-1950 Today's Date: 12/12/2022 Time: 2536-6440 SLP Time Calculation (min) (ACUTE ONLY): 45 min  Assessment / Plan / Recommendation Clinical Impression  Pt seen for ongoing assessment of swallowing today per MD request; Husband present in room. MD requesting determination of pt's ability to safely consume any oral intake. Pt was made NPO on 12/06/22 s/p BSE results revealing no obvious response to presentation of po's. Pt has NGT present for nutrition/hydration needs. Noted Neurosurgery notes: pt is s/p bilateral burr hole surgery for bilateral subdural hematomas with symptomatic mass effect. Drains removed now. Per report, pt is mostly nonverbal at baseline in setting of prior stroke with Vascular Dementia.   At this session, pt was given MAX verbal/tactile/visual stimulation by this SLP w/ no eye opening nor indication of awareness of SLP nor Staff(when moving pt upright in bed). Oral care via swabs was attempted w/ aversive behavior then low neuro response behaviors(primitive behaviors) noted to the presentation of swabs: clamped lips, tonic biting on swabs, grimacing. MAX cues and encouragement were given.   W/ no further engagement despite the cues and encouragement, NO po trials were attempted as pt was NOT alert nor Cognitively engaged to safely be given po's. Assessment ended.  Education provided to Husband on pt's presentation of Dysphagia in setting of Cognitive decline, her response behaviors, risk for aspiration/aspiration pneumonia w/ any oral intake, and need for discussion of more long-term alternative means of feeding(PEG) w/ MD/Team. Discussed offering attempts for oral care for hygiene and stimulation of swallowing also. Husband agreed.   Pt appears to present w/ Profound oropharyngeal phase Dysphagia in setting of baseline Dementia, stroke and now new subdural hematomas  w/ evacuation. HIGH risk for aspiration/aspiration pneumonia if given po's. Recommend NPO status w/ frequent oral care when receptive to it for hygiene and stimulation of swallowing; aspiration precautions.  Recommend ST services for dysphagia tx at her next venue of care, Rehab setting, when pt's medical and alert status' have improved for participation in therapy. Recommend ongoing discussion w/ Palliative Care for GOC including alternative means of feeding, dysphagia w/ Cognitive decline; Dietician f/u. MD/NSG updated and agreed.    HPI HPI: Christine Hull is a 72 y.o. female w/ Baseline Dementia who presented to ED following 2-week hx of shuffling gait, increased somnolence, and decreased ability to participate in ADLs. Symptoms started following an unwitnessed fall with head strike. Pt medical history significant for hypothyroidism, hypertension, prior stroke with vascular dementia. Head CT, "1. Bilateral holo hemispheric subdural hematomas (likely acute on  subacute), right greater than left. There is resultant 4 mm of  midline shift to the left.  2. Likely small amount of subarachnoid hemorrhage in the left  temporal lobe in the middle cranial fossa.".  Per Neurosurgery note: POD 2 s/p bilateral burr holes for  bilateral subdural hematomas with symptomatic mass effect.   Pt continues w/ a shutdown State presentation; not responding except to localized tactile stim.  NGT present.      SLP Plan  Discharge SLP treatment due to (comment) (f/u at next venue of care/Rehab)      Recommendations for follow up therapy are one component of a multi-disciplinary discharge planning process, led by the attending physician.  Recommendations may be updated based on patient status, additional functional criteria and insurance authorization.    Recommendations  Diet recommendations: NPO (w/ PEG placement scheduled per MD) Liquids provided via:  (n/a) Medication Administration:  Via alternative means                  (Rehab next venue of care) Oral care QID;Staff/trained caregiver to provide oral care   Frequent or constant Supervision/Assistance (w/ oral care) Dysphagia, oropharyngeal phase (R13.12) (baseline Dementia; new subdural hematomas w/ evacuation)     Discharge SLP treatment due to (comment) (f/u at next venue of care/Rehab)       [name], MS, CCC-SLP Speech Language Pathologist Rehab Services; [facility] [place] [phone] (ascom) Dorine Duffey  12/12/2022, 2:29 PM

## 2022-12-12 NOTE — Progress Notes (Signed)
Neurosurgery Progress Note  History: Christine Hull POD 5 s/p bilateral burr holes for  bilateral subdural hematomas with symptomatic mass effect.   Per history, baseline is Ox2.    24-hour events: 11/30: Patient doing well after drain removal yesterday.  No changes neurologically.  Incisions remain clean and dry  Hospital Course 11/28: Patient appears at baseline with eyes open and nonverbal, she is moving extremities.  Her drains do have some continued output.  11/29: Stable today. DVT prophylaxis started. Subdural drains removed today  Physical Exam: Vitals:   12/12/22 0700 12/12/22 0800  BP: (!) 127/95 (!) 140/121  Pulse: 88 88  Resp: (!) 23 (!) 24  Temp:  99.6 F (37.6 C)  SpO2: 97% 96%    Eyes open spontaneously, but does not follow commands.  Nonverbal.   Intermittently mumbles Moves all extremities x4 symmetrically  Dressings and drains removed, incisions clean and dry  Data:  Output by Drain (mL) 12/10/22 0701 - 12/10/22 1900 12/10/22 1901 - 12/11/22 0700 12/11/22 0701 - 12/11/22 1900 12/11/22 1901 - 12/12/22 0700 12/12/22 0701 - 12/12/22 1018  Requested LDAs do not have output data documented.    CT head without contrast: IMPRESSION: Motion degraded exam.   1. Interval postsurgical changes from bilateral burr hole craniotomies and placement of subdural drainage catheters. The right-sided drainage catheter terminates in the craniotomy defect. 2. Postoperative pneumocephalus causes mass effect on the bilateral frontal lobes resulting in a somewhat peaked appearance of the bilateral frontal lobes. While nonspecific, this appearance could be seen in the setting of tension pneumocephalus, which is not excluded. 3. Interval decrease in size of bilateral subdural collections which now measure up to 5 mm on the right and 6 mm on the left, previously 1.5 cm on the right 1.1 cm on the left. Interval resolution of midline shift. 4. Slight interval increase in  size of the right temporal horn, which may be secondary to decreased mass effect, but early hydrocephalus is not entirely excluded. Recommend attention on follow up.   Assessment/Plan:  Christine Hull POD 5 s/p bilateral burr holes for  bilateral subdural hematomas with symptomatic mass effect.   -Okay for transfer to the floor from neurosurgery perspective -CT head on 11/26 with resolution of midline shift, no further imaging needed at this time.  If patient has a change in neurologic exam, stat CT head should be ordered -Drains removed 11/29 -Okay for DVT prophylaxis - Dispo per medical team  Lucy Chris, MD

## 2022-12-12 NOTE — Progress Notes (Addendum)
Progress Note   Patient: Christine Hull MVH:[id number] DOB: 1950/03/30 DOA: 12/04/2022     8 DOS: the patient was seen and examined on 12/12/2022   Brief hospital course: 72 year old female with past medical history of hypothyroidism, hypertension, prior stroke with vascular dementia brought into the ED after decreased mental status and not walking as much.  She had a fall around a week ago.  In the ER found to have bilateral subdural hematomas with resultant 4 mm midline shift to the left and possible subarachnoid hemorrhage.  Bilateral burr hole procedure done by neurosurgery on 11/25.   11/26. Decrease in size of bilateral subdural collections 5 mm on the right (was 1.5 cm previously), 6 mm on the left (was previously 1.1 cm).  Resolution of midline shift. 11/27.  Patient is evaluated by speech therapy, still not safe to for oral intake.  Place NG tube start tube feeding. 11/29.  Neurosurgery has removed the drain.   Principal Problem:   Traumatic subdural hematoma without loss of consciousness (HCC) Active Problems:   Traumatic subarachnoid hemorrhage (HCC)   Acute metabolic encephalopathy   Hypothyroidism   VAD (vascular dementia) (HCC)   Hypokalemia   Essential hypertension   Sinus bradycardia   History of fall   History of CVA (cerebrovascular accident)   Protein-calorie malnutrition, severe   Hypophosphatemia   Hyponatremia   Assessment and Plan: * Traumatic subdural hematoma without loss of consciousness (HCC) Traumatic subarachnoid hemorrhage History of fall with head strike 2 weeks prior to admission Bilateral burr hole procedure done on 11/25.  Decrease in the amount of blood on subdural hematomas and resolution of midline shift.  Patient with bilateral drains.  Patient has been seen by speech therapy, still not safe for any p.o. intake, given her altered mental status.  NG tube was placed on 11/27 after discussion with husband, tube feeding started.  Patient seem to  be tolerating well.  Patient had a low-grade fever the night of 11/27 with temperature 100.2, chest x-ray did not show any pneumonia.  Procalcitonin level less than 0.1, most likely this is secondary to trauma from subdural hematoma.  No antibiotics needed. Neurosurgery has removed the drain on 11/29.  Patient currently medically stable for transfer to medical floor. Patient has decreased urine output but her renal function still normal.  Sodium level has dropped a little bit, will give 500 mL of normal saline infusion.     Acute metabolic encephalopathy VAD (vascular dementia) (HCC) History of stroke. Failure to thrive. Patient has severe dementia at baseline, she is nonverbal.  She has a worsening mental status since admission.  Her dementia probably will be getting worse after brain bleed.  Long-term prognosis very poor, palliative care consult obtained. Tolerating tube feeding.  Since patient has been more stable, will ask speech therapy to evaluate patient again to see if he can start diet.  May consider PEG tube placement if still not able to eat.   Hypokalemia. Hypophosphatemia. Hyponatremia. Potassium and phosphate are normalized, patient developed a mild hyponatremia, could be due to lower intake.  Give 500 mL normal saline infusion.    Hypoglycemia secondary to poor p.o. intake. Resolved after tolerating tube feeding.     Essential hypertension Continued losartan at lower dose.   Hypothyroidism Continue levothyroxine  Addendum: 1840. Patient spiked a high fever today. Check UA and CXR. Blood culture 2 days ago negative. Cover with rocephin for now until figuring out the source. Start IVF.     Subjective:  Patient is nonverbal, she is tracking voice.  Per husband, patient mental status seems to be close to baseline now.  Physical Exam: Vitals:   12/12/22 0500 12/12/22 0600 12/12/22 0700 12/12/22 0800  BP: (!) 121/56 128/68 (!) 127/95 (!) 140/121  Pulse: 92 81 88 88   Resp: (!) 25 20 (!) 23 (!) 24  Temp:    99.6 F (37.6 C)  TempSrc:    Axillary  SpO2: 100% 98% 97% 96%  Weight:      Height:       General exam: Appears calm and comfortable  Respiratory system: Clear to auscultation. Respiratory effort normal. Cardiovascular system: S1 & S2 heard, RRR. No JVD, murmurs, rubs, gallops or clicks. No pedal edema. Gastrointestinal system: Abdomen is nondistended, soft and nontender. No organomegaly or masses felt. Normal bowel sounds heard. Central nervous system: Alert and nonverbal. Extremities: Symmetric 5 x 5 power. Skin: No rashes, lesions or ulcers Psychiatry: Flat affect.   Data Reviewed:  Lab results reviewed.  Family Communication: Husband updated at bedside.  Disposition: Status is: Inpatient Remains inpatient appropriate because: Severity of disease.     Time spent: 50 minutes  Author: Marrion Coy, MD 12/12/2022 10:05 AM  For on call review [email or web address].

## 2022-12-12 NOTE — Plan of Care (Signed)
  Problem: Cardiac: Goal: Will achieve and/or maintain adequate cardiac output Outcome: Progressing   Problem: Clinical Measurements: Goal: Respiratory complications will improve Outcome: Progressing

## 2022-12-12 NOTE — Plan of Care (Signed)

## 2022-12-13 ENCOUNTER — Inpatient Hospital Stay: Payer: Medicare Other

## 2022-12-13 DIAGNOSIS — E43 Unspecified severe protein-calorie malnutrition: Secondary | ICD-10-CM | POA: Diagnosis not present

## 2022-12-13 DIAGNOSIS — S065X0D Traumatic subdural hemorrhage without loss of consciousness, subsequent encounter: Secondary | ICD-10-CM | POA: Diagnosis not present

## 2022-12-13 DIAGNOSIS — K810 Acute cholecystitis: Secondary | ICD-10-CM | POA: Diagnosis not present

## 2022-12-13 DIAGNOSIS — S066X0A Traumatic subarachnoid hemorrhage without loss of consciousness, initial encounter: Secondary | ICD-10-CM | POA: Diagnosis not present

## 2022-12-13 DIAGNOSIS — F01C18 Vascular dementia, severe, with other behavioral disturbance: Secondary | ICD-10-CM | POA: Diagnosis not present

## 2022-12-13 DIAGNOSIS — Z515 Encounter for palliative care: Secondary | ICD-10-CM | POA: Diagnosis not present

## 2022-12-13 LAB — CBC
HCT: 28.7 % — ABNORMAL LOW (ref 36.0–46.0)
Hemoglobin: 9.6 g/dL — ABNORMAL LOW (ref 12.0–15.0)
MCH: 30 pg (ref 26.0–34.0)
MCHC: 33.4 g/dL (ref 30.0–36.0)
MCV: 89.7 fL (ref 80.0–100.0)
Platelets: 401 10*3/uL — ABNORMAL HIGH (ref 150–400)
RBC: 3.2 MIL/uL — ABNORMAL LOW (ref 3.87–5.11)
RDW: 13.2 % (ref 11.5–15.5)
WBC: 25.2 10*3/uL — ABNORMAL HIGH (ref 4.0–10.5)
nRBC: 0 % (ref 0.0–0.2)

## 2022-12-13 LAB — BASIC METABOLIC PANEL
Anion gap: 9 (ref 5–15)
BUN: 21 mg/dL (ref 8–23)
CO2: 25 mmol/L (ref 22–32)
Calcium: 7.8 mg/dL — ABNORMAL LOW (ref 8.9–10.3)
Chloride: 102 mmol/L (ref 98–111)
Creatinine, Ser: 0.67 mg/dL (ref 0.44–1.00)
GFR, Estimated: >60 mL/min (ref >60)
Glucose, Bld: 146 mg/dL — ABNORMAL HIGH (ref 70–99)
Potassium: 3.9 mmol/L (ref 3.5–5.1)
Sodium: 136 mmol/L (ref 135–145)

## 2022-12-13 LAB — GLUCOSE, CAPILLARY
Glucose-Capillary: 116 mg/dL — ABNORMAL HIGH (ref 70–99)
Glucose-Capillary: 117 mg/dL — ABNORMAL HIGH (ref 70–99)
Glucose-Capillary: 128 mg/dL — ABNORMAL HIGH (ref 70–99)
Glucose-Capillary: 146 mg/dL — ABNORMAL HIGH (ref 70–99)
Glucose-Capillary: 85 mg/dL (ref 70–99)
Glucose-Capillary: 89 mg/dL (ref 70–99)
Glucose-Capillary: 97 mg/dL (ref 70–99)

## 2022-12-13 LAB — PROTIME-INR
INR: 1.3 — ABNORMAL HIGH (ref 0.8–1.2)
Prothrombin Time: 16 s — ABNORMAL HIGH (ref 11.4–15.2)

## 2022-12-13 LAB — PHOSPHORUS: Phosphorus: 2.6 mg/dL (ref 2.5–4.6)

## 2022-12-13 LAB — MAGNESIUM: Magnesium: 2.1 mg/dL (ref 1.7–2.4)

## 2022-12-13 LAB — PROCALCITONIN: Procalcitonin: 0.61 ng/mL

## 2022-12-13 LAB — LACTIC ACID, PLASMA
Lactic Acid, Venous: 0.7 mmol/L (ref 0.5–1.9)
Lactic Acid, Venous: 1 mmol/L (ref 0.5–1.9)

## 2022-12-13 MED ORDER — OXYCODONE-ACETAMINOPHEN 5-325 MG PO TABS
0.5000 | ORAL_TABLET | ORAL | Status: DC | PRN
  Administered 2022-12-14 – 2022-12-16 (×2): 0.5 via NASOGASTRIC
  Filled 2022-12-13 (×2): qty 1

## 2022-12-13 MED ORDER — SODIUM CHLORIDE 0.9 % IV BOLUS
1000.0000 mL | Freq: Once | INTRAVENOUS | Status: AC
  Administered 2022-12-13: 1000 mL via INTRAVENOUS

## 2022-12-13 MED ORDER — DEXTROSE-SODIUM CHLORIDE 5-0.9 % IV SOLN: INTRAVENOUS | Status: AC

## 2022-12-13 NOTE — Progress Notes (Signed)
Daily Progress Note   Patient Name: Christine Hull       Date: 12/13/2022 DOB: 10-11-1950  Age: 72 y.o. MRN#: [id number] Attending Physician: Marrion Coy, MD Primary Care Physician: Annita Brod, MD Admit Date: 12/04/2022  Reason for Consultation/Follow-up: Establishing goals of care  HPI/Brief Hospital Review: 72 y.o. female  with past medical history of hypothyroidism, hypertension, and prior stroke with vascular dementia admitted on 12/04/2022 with AMS and decreased function. Found to have bilateral subdural hematomas with resultant 4 mm midline shift to the left and possible subarachnoid hemorrhage. Bilateral burr hole procedure done by neurosurgery on 11/25.   11/26. Decrease in size of bilateral subdural collections 5 mm on the right (was 1.5 cm previously), 6 mm on the left (was previously 1.1 cm).  Resolution of midline shift. 11/27.  Patient is evaluated by speech therapy, still not safe to for oral intake.  Place NG tube start tube feeding. PMT consulted to discuss GOC.   SLP consulted recommend NPO-PEG tube discussions had, family interested in pursing PEG placement  CT abdomen obtained for possible gastrostomy placement and findings consistent with acute cholecystitis-general surgery consulted  Subjective: Extensive chart review has been completed prior to meeting patient including labs, vital signs, imaging, progress notes, orders, and available advanced directive documents from current and previous encounters.    Met with Christine Hull at her bedside. She is in bed resting with her eyes closed and does not acknowledge my presence in room. Husband and daughter at bedside during time of visit.  Husband and daughter wanting to discuss options as they are concerned her needs  cannot be met at home if they pursue tube feedings. They are wanting to discuss possible options such as facility placement. We discuss the difference between SNF and LTC. They are open to the possibility of placement but we also discussed the option of HH and training that would be made available to family for tube feedings.  Answered and addressed all questions and concerns. Pending surgical recommendations. PMT to continue to follow for ongoing needs and support.  Thank you for allowing the Palliative Medicine Team to assist in the care of this patient.  Total time:  25 minutes  Time spent includes: Detailed review of medical records (labs, imaging, vital signs), medically appropriate exam (mental status, respiratory, cardiac, skin),  discussed with treatment team, counseling and educating patient, family and staff, documenting clinical information, medication management and coordination of care.  Leeanne Deed, DNP, AGNP-C Palliative Medicine   Please contact Palliative Medicine Team phone at [phone] for questions and concerns.

## 2022-12-13 NOTE — Progress Notes (Signed)
Progress Note   Patient: Christine Hull ZOX:[id number] DOB: 04-Apr-1950 DOA: 12/04/2022     9 DOS: the patient was seen and examined on 12/13/2022   Brief hospital course: 72 year old female with past medical history of hypothyroidism, hypertension, prior stroke with vascular dementia brought into the ED after decreased mental status and not walking as much.  She had a fall around a week ago.  In the ER found to have bilateral subdural hematomas with resultant 4 mm midline shift to the left and possible subarachnoid hemorrhage.  Bilateral burr hole procedure done by neurosurgery on 11/25.   11/26. Decrease in size of bilateral subdural collections 5 mm on the right (was 1.5 cm previously), 6 mm on the left (was previously 1.1 cm).  Resolution of midline shift. 11/27.  Patient is evaluated by speech therapy, still not safe to for oral intake.  Place NG tube start tube feeding. 11/29.  Neurosurgery has removed the drain. 11/30.  Patient developed a high fever, CT abdomen showed acalculous cholecystitis.  Started Zosyn.    Principal Problem:   Traumatic subdural hematoma without loss of consciousness (HCC) Active Problems:   Traumatic subarachnoid hemorrhage (HCC)   Acute metabolic encephalopathy   Hypothyroidism   VAD (vascular dementia) (HCC)   Hypokalemia   Essential hypertension   Sinus bradycardia   History of fall   History of CVA (cerebrovascular accident)   Protein-calorie malnutrition, severe   Hypophosphatemia   Hyponatremia   Acute acalculous cholecystitis   Assessment and Plan: Acute acalculous cholecystitis. Severe sepsis. Patient developed significant fever and tachycardia, CT abdomen pelvis showed acute acalculous cholecystitis 11/30.  Patient was started on Zosyn. General surgery has seen the patient, currently will be treated with antibiotics, general surgery will continue to follow to decide if patient need cholecystostomy. Patient was hypotensive last night,  received a fluid bolus.  Blood pressure is more stable today. Patient also started on IV fluids, discontinued tube feeding.  Patient will be NPO. Continue follow closely in the stepdown unit.  * Traumatic subdural hematoma without loss of consciousness (HCC) Traumatic subarachnoid hemorrhage History of fall with head trauma 2 weeks prior to admission Bilateral burr hole procedure done on 11/25.  Decrease in the amount of blood on subdural hematomas and resolution of midline shift.  Patient with bilateral drains.  Patient has been seen by speech therapy, still not safe for any p.o. intake, given her altered mental status.  NG tube was placed on 11/27 after discussion with husband, tube feeding started.  Patient seem to be tolerating well.  Patient had a low-grade fever the night of 11/27 with temperature 100.2, chest x-ray did not show any pneumonia.  Procalcitonin level less than 0.1, most likely this is secondary to trauma from subdural hematoma.  No antibiotics needed. Neurosurgery has removed the drain on 11/29.  Patient has been stable from neurosurgical standpoint.     Acute metabolic encephalopathy VAD (vascular dementia) (HCC) History of stroke. Failure to thrive. Patient has severe dementia at baseline, she is nonverbal.  She has a worsening mental status since admission.  Her dementia probably will be getting worse after brain bleed.  Long-term prognosis very poor, palliative care consult obtained. She has been receiving tube feeding, planning for PEG on Monday.  However, due to acute changes, this has been canceled.  Patient made NPO.   Hypokalemia. Hypophosphatemia. Hyponatremia. Conditions are resolved, continue monitoring.    Hypoglycemia secondary to poor p.o. intake. Resolved after tolerating tube feeding.  Essential hypertension Discontinued losartan due to low blood pressure.   Hypothyroidism Continue levothyroxine  Prognosis. Patient long-term prognosis is  poor, short term prognosis guarded.  Husband still wants full code.    Subjective:  Patient is nonverbal, she did not voice any complaint.  Physical Exam: Vitals:   12/13/22 0400 12/13/22 0700 12/13/22 0800 12/13/22 0900  BP:  115/78 111/65 99/83  Pulse:  81 76 76  Resp:  17 (!) 21 18  Temp: 98.8 F (37.1 C)  99.5 F (37.5 C)   TempSrc: Axillary  Axillary   SpO2:  100% 99% 93%  Weight:      Height:       General exam: Appears calm and comfortable  Respiratory system: Clear to auscultation. Respiratory effort normal. Cardiovascular system: S1 & S2 heard, RRR. No JVD, murmurs, rubs, gallops or clicks. No pedal edema. Gastrointestinal system: Abdomen is nondistended, soft and RUQ tender. No organomegaly or masses felt. Normal bowel sounds heard. Central nervous system: Alert and nonverbal. Extremities: Symmetric 5 x 5 power. Skin: No rashes, lesions or ulcers Psychiatry: Flat affect   Data Reviewed:  CT scan results, lab results, chest x-ray results are reviewed.  Family Communication: Husband updated at bedside, all questions answered.  Disposition: Status is: Inpatient Remains inpatient appropriate because: Severity of disease, IV treatment.     Time spent: 60 minutes  Author: Marrion Coy, MD 12/13/2022 10:09 AM  For on call review [email or web address].

## 2022-12-13 NOTE — Consult Note (Signed)
Patient ID: Christine Hull, female   DOB: Feb 07, 1950, 72 y.o.   MRN: [id number] CC: Acalculous Cholecysitits  History of Present Illness Christine Hull is a 72 y.o. female with medical history significant for vascular dementia who is nonverbal at baseline, status post burr holes for subdural hematoma 25 November we are consulted on for acalculous cholecystitis.  The patient was admitted to the hospital on 24 November with bilateral subdural hematomas that had a mass effect causing decreased mental status.  This was after a fall at home.  She subsequently underwent burr hole placement to the ICU postoperatively.  Her drain was removed and she was progressing well however last night she had an increase in her leukocytosis along with fevers and tachycardia this prompted initiation of antibiotics and a CT scan.  The CT scan was significant for a distended gallbladder with potential pericholecystic fluid concerning for a calculus cholecystitis.  On exam this morning she is nonverbal.  She has tube feeds running..  Past Medical History Past Medical History:  Diagnosis Date   Dementia (HCC)    Heart murmur    Hypertension    Hypothyroidism    [employer] LINQ 05/09/2013   Near syncope 02/02/2013   Prolonged QT interval 02/02/2013   Sinus bradycardia 05/09/2013       Past Surgical History:  Procedure Laterality Date   AUGMENTATION MAMMAPLASTY Bilateral 1990's   "had implants put in; had them taken out 6 months later" (02/02/2013)   BREAST BIOPSY Right 2011   Sankar,  normal   BURR HOLE Bilateral 12/07/2022   Procedure: Ezekiel Ina;  Surgeon: Lovenia Kim, MD;  Location: [facility] ORS;  Service: Neurosurgery;  Laterality: Bilateral;  Bilateral Burr holes   LOOP RECORDER IMPLANT N/A 02/03/2013   Procedure: LOOP RECORDER IMPLANT;  Surgeon: Duke Salvia, MD;  Location: [facility] CATH LAB;  Service: Cardiovascular;  Laterality: N/A;    No Known Allergies  Current Facility-Administered Medications   Medication Dose Route Frequency Provider Last Rate Last Admin   acetaminophen (TYLENOL) tablet 650 mg  650 mg Per Tube Q6H PRN Otelia Sergeant, RPH   650 mg at 12/13/22 1023   Or   acetaminophen (TYLENOL) suppository 650 mg  650 mg Rectal Q6H PRN Otelia Sergeant, RPH       Chlorhexidine Gluconate Cloth 2 % PADS 6 each  6 each Topical Daily Alford Highland, MD   6 each at 12/13/22 1017   dextrose 5 %-0.9 % sodium chloride infusion   Intravenous Continuous Marrion Coy, MD 75 mL/hr at 12/13/22 1013 New Bag at 12/13/22 1013   levETIRAcetam (KEPPRA) IVPB 500 mg/100 mL premix  500 mg Intravenous Q12H Venetia Night, MD 400 mL/hr at 12/13/22 1015 500 mg at 12/13/22 1015   levothyroxine (SYNTHROID) tablet 50 mcg  50 mcg Per Tube Q0600 Otelia Sergeant, RPH   50 mcg at 12/13/22 0554   liothyronine (CYTOMEL) tablet 5 mcg  5 mcg Per Tube Daily Lowella Bandy, RPH   5 mcg at 12/13/22 1005   Oral care mouth rinse  15 mL Mouth Rinse PRN Marrion Coy, MD       Oral care mouth rinse  15 mL Mouth Rinse 4 times per day Marrion Coy, MD   15 mL at 12/13/22 0725   piperacillin-tazobactam (ZOSYN) IVPB 3.375 g  3.375 g Intravenous Tobe Sos, MD 12.5 mL/hr at 12/13/22 0847 Infusion Verify at 12/13/22 0847   sertraline (ZOLOFT) tablet 50 mg  50 mg Per  Tube Daily Otelia Sergeant, RPH   50 mg at 12/13/22 1004   sodium chloride flush (NS) 0.9 % injection 3 mL  3 mL Intravenous Q12H Andris Baumann, MD   3 mL at 12/13/22 1018   thiamine (VITAMIN B1) tablet 100 mg  100 mg Per Tube Daily Marrion Coy, MD   100 mg at 12/13/22 1004    Family History Family History  Problem Relation Age of Onset   Early death Mother        stroke resulting from prior head injury   Stroke Mother    Early death Father        MVA   Cancer Maternal Aunt        stomach cancer   Breast cancer Neg Hx        Social History Social History   Tobacco Use   Smoking status: Every Day    Current packs/day: 0.50    Average  packs/day: 0.5 packs/day for 41.0 years (20.5 ttl pk-yrs)    Types: Cigarettes   Smokeless tobacco: Never  Substance Use Topics   Alcohol use: Yes    Alcohol/week: 5.0 standard drinks of alcohol    Types: 5 Shots of liquor per week    Comment: 02/02/2013 "drink a shot  in my coffee ~ nightly before bed"   Drug use: No        ROS Full ROS of systems performed and is otherwise negative there than what is stated in the HPI  Physical Exam Blood pressure 100/75, pulse 69, temperature 99.5 F (37.5 C), temperature source Axillary, resp. rate 18, height 5\' 5"  (1.651 m), weight 55.4 kg, SpO2 100%.  She appears chronically ill with some contracture of her upper extremities.  She is nonverbal but does track me with her eyes.  She has an NG tube in place.  On exam of her abdomen she does have some tenderness in the right upper quadrant.  She is nondistended. Data Reviewed I reviewed her labs this is notable for a leukocytosis to 25,000.  Her kidney function is normal.  Her CT scan is done without contrast but you can see that there is a distended gallbladder.  I do not appreciate any stones within the gallbladder lumen.  I have personally reviewed the patient's imaging and medical records.    Assessment/Plan    72 year old who was recently admitted for subdural hematomas who has undergone evacuation.  Her drain has been removed but she started to have fevers.  She has a leukocytosis.  On CT scan there is concern for acalculous cholecystitis.  Given that she recently had a surgery for subdural hematoma and that she is nonverbal at baseline I would not offer her surgical intervention for her cholecystitis.  I would attempt, especially given that there are no obstructing stones, treating this with antibiotics.  If she does not improve with antibiotics then I would consult IR for cholecystostomy tube.  However, if she has a cholecystostomy tube the likelihood that she would have this for the rest of her  life is high.  For that reason I think unless she has profound hemodynamic instability or develops peritonitis we should try to treat this just with antibiotics. Please stop tube feeds for now     Kandis Cocking 12/13/2022, 11:44 AM

## 2022-12-13 NOTE — Plan of Care (Signed)
  Problem: Education: Goal: Knowledge of condition and prescribed therapy will improve Outcome: Not Progressing   Problem: Cardiac: Goal: Will achieve and/or maintain adequate cardiac output Outcome: Not Progressing   Problem: Physical Regulation: Goal: Complications related to the disease process, condition or treatment will be avoided or minimized Outcome: Not Progressing   Problem: Education: Goal: Knowledge of General Education information will improve Description: Including pain rating scale, medication(s)/side effects and non-pharmacologic comfort measures Outcome: Not Progressing   Problem: Health Behavior/Discharge Planning: Goal: Ability to manage health-related needs will improve Outcome: Not Progressing

## 2022-12-14 DIAGNOSIS — Z7189 Other specified counseling: Secondary | ICD-10-CM | POA: Diagnosis not present

## 2022-12-14 DIAGNOSIS — E43 Unspecified severe protein-calorie malnutrition: Secondary | ICD-10-CM | POA: Diagnosis not present

## 2022-12-14 DIAGNOSIS — K810 Acute cholecystitis: Secondary | ICD-10-CM | POA: Diagnosis not present

## 2022-12-14 DIAGNOSIS — S066X0A Traumatic subarachnoid hemorrhage without loss of consciousness, initial encounter: Secondary | ICD-10-CM | POA: Diagnosis not present

## 2022-12-14 DIAGNOSIS — G9341 Metabolic encephalopathy: Secondary | ICD-10-CM | POA: Diagnosis not present

## 2022-12-14 LAB — BASIC METABOLIC PANEL
Anion gap: 7 (ref 5–15)
BUN: 11 mg/dL (ref 8–23)
CO2: 22 mmol/L (ref 22–32)
Calcium: 7.9 mg/dL — ABNORMAL LOW (ref 8.9–10.3)
Chloride: 108 mmol/L (ref 98–111)
Creatinine, Ser: 0.6 mg/dL (ref 0.44–1.00)
GFR, Estimated: >60 mL/min (ref >60)
Glucose, Bld: 105 mg/dL — ABNORMAL HIGH (ref 70–99)
Potassium: 3.7 mmol/L (ref 3.5–5.1)
Sodium: 137 mmol/L (ref 135–145)

## 2022-12-14 LAB — CBC
HCT: 25.8 % — ABNORMAL LOW (ref 36.0–46.0)
Hemoglobin: 8.5 g/dL — ABNORMAL LOW (ref 12.0–15.0)
MCH: 30.1 pg (ref 26.0–34.0)
MCHC: 32.9 g/dL (ref 30.0–36.0)
MCV: 91.5 fL (ref 80.0–100.0)
Platelets: 356 10*3/uL (ref 150–400)
RBC: 2.82 MIL/uL — ABNORMAL LOW (ref 3.87–5.11)
RDW: 13.5 % (ref 11.5–15.5)
WBC: 21.7 10*3/uL — ABNORMAL HIGH (ref 4.0–10.5)
nRBC: 0 % (ref 0.0–0.2)

## 2022-12-14 LAB — GLUCOSE, CAPILLARY
Glucose-Capillary: 109 mg/dL — ABNORMAL HIGH (ref 70–99)
Glucose-Capillary: 69 mg/dL — ABNORMAL LOW (ref 70–99)
Glucose-Capillary: 83 mg/dL (ref 70–99)
Glucose-Capillary: 84 mg/dL (ref 70–99)
Glucose-Capillary: 88 mg/dL (ref 70–99)
Glucose-Capillary: 90 mg/dL (ref 70–99)
Glucose-Capillary: 96 mg/dL (ref 70–99)

## 2022-12-14 LAB — CULTURE, BLOOD (ROUTINE X 2)
Culture: NO GROWTH
Culture: NO GROWTH
Special Requests: ADEQUATE

## 2022-12-14 LAB — MAGNESIUM: Magnesium: 2 mg/dL (ref 1.7–2.4)

## 2022-12-14 MED ORDER — DEXTROSE 50 % IV SOLN
INTRAVENOUS | Status: AC
  Administered 2022-12-14: 25 mL
  Filled 2022-12-14: qty 50

## 2022-12-14 MED ORDER — DEXTROSE 50 % IV SOLN: 12.5000 g | INTRAVENOUS | Status: AC

## 2022-12-14 MED ORDER — ENOXAPARIN SODIUM 40 MG/0.4ML IJ SOSY
40.0000 mg | PREFILLED_SYRINGE | Freq: Every day | INTRAMUSCULAR | Status: DC
  Administered 2022-12-14 – 2022-12-28 (×13): 40 mg via SUBCUTANEOUS
  Filled 2022-12-14 (×13): qty 0.4

## 2022-12-14 MED ORDER — ORAL CARE MOUTH RINSE: 15.0000 mL | OROMUCOSAL | Status: DC | PRN

## 2022-12-14 MED ORDER — DEXTROSE-SODIUM CHLORIDE 5-0.9 % IV SOLN: INTRAVENOUS | Status: AC

## 2022-12-14 NOTE — Progress Notes (Signed)
Progress Note   Patient: Christine Hull VHQ:[id number] DOB: November 30, 1950 DOA: 12/04/2022     10 DOS: the patient was seen and examined on 12/14/2022   Brief hospital course: 72 year old female with past medical history of hypothyroidism, hypertension, prior stroke with vascular dementia brought into the ED after decreased mental status and not walking as much.  She had a fall around a week ago.  In the ER found to have bilateral subdural hematomas with resultant 4 mm midline shift to the left and possible subarachnoid hemorrhage.  Bilateral burr hole procedure done by neurosurgery on 11/25.   11/26. Decrease in size of bilateral subdural collections 5 mm on the right (was 1.5 cm previously), 6 mm on the left (was previously 1.1 cm).  Resolution of midline shift. 11/27.  Patient is evaluated by speech therapy, still not safe to for oral intake.  Place NG tube start tube feeding. 11/29.  Neurosurgery has removed the drain. 11/30.  Patient developed a high fever, CT abdomen showed acalculous cholecystitis.  Started Zosyn.    Principal Problem:   Traumatic subdural hematoma without loss of consciousness (HCC) Active Problems:   Traumatic subarachnoid hemorrhage (HCC)   Acute metabolic encephalopathy   Hypothyroidism   VAD (vascular dementia) (HCC)   Hypokalemia   Essential hypertension   Sinus bradycardia   History of fall   History of CVA (cerebrovascular accident)   Protein-calorie malnutrition, severe   Hypophosphatemia   Hyponatremia   Acute acalculous cholecystitis   Assessment and Plan: Acute acalculous cholecystitis. Severe sepsis. Patient developed significant fever and tachycardia, CT abdomen pelvis showed acute acalculous cholecystitis 11/30.  Patient was started on Zosyn. General surgery has seen the patient, currently will be treated with antibiotics, general surgery will continue to follow to decide if patient need cholecystostomy. Patient was hypotensive 12/1,  received a liter of fluid bolus.  Lactic acid was normal.  Blood pressure has been stabilized today. At this point, patient right upper quadrant tenderness seem to be improving, mental status also improving.  I will continue current antibiotics.  I will continue NPO.  Will consider restart tube feeding by 12/3 if condition continues to improve.  Will reconsider PEG tube placement after  stable on tube feeding with NG tube.   * Traumatic subdural hematoma without loss of consciousness (HCC) Traumatic subarachnoid hemorrhage History of fall with head trauma 2 weeks prior to admission Bilateral burr hole procedure done on 11/25.  Decrease in the amount of blood on subdural hematomas and resolution of midline shift.  Patient with bilateral drains.  Patient has been seen by speech therapy, still not safe for any p.o. intake, given her altered mental status.  NG tube was placed on 11/27 after discussion with husband, tube feeding started.  Patient seem to be tolerating well.  Patient had a low-grade fever the night of 11/27 with temperature 100.2, chest x-ray did not show any pneumonia.  Procalcitonin level less than 0.1, most likely this is secondary to trauma from subdural hematoma.  No antibiotics needed. Neurosurgery has removed the drain on 11/29.  Patient has been stable from neurosurgical standpoint.     Acute metabolic encephalopathy VAD (vascular dementia) (HCC) History of stroke. Failure to thrive. Patient has severe dementia at baseline, she is nonverbal.  She has a worsening mental status since admission.  Her dementia probably will be getting worse after brain bleed.  Long-term prognosis very poor, palliative care consult obtained. Patient currently n.p.o. due to acute cholecystitis.   Hypokalemia. Hypophosphatemia.  Hyponatremia. Conditions are resolved, continue monitoring.    Hypoglycemia secondary to poor p.o. intake. Continue D5 with the saline.     Essential  hypertension Discontinued losartan due to low blood pressure.   Hypothyroidism Continue levothyroxine   Prognosis. Patient long-term prognosis is poor, short term prognosis guarded.  Husband still wants full code.        Subjective:  Patient seems to be better today, more responsive.  Physical Exam: Vitals:   12/14/22 0600 12/14/22 0800 12/14/22 0900 12/14/22 1000  BP: 135/75 (!) 143/79 (!) 117/92 (!) 144/93  Pulse: 67 69 64 68  Resp: 17 15 17 15   Temp:   98.5 F (36.9 C)   TempSrc:   Oral   SpO2: 99% 100% 97% 99%  Weight:      Height:       General exam: Appears calm and comfortable,  Respiratory system: Clear to auscultation. Respiratory effort normal. Cardiovascular system: S1 & S2 heard, RRR. No JVD, murmurs, rubs, gallops or clicks. No pedal edema. Gastrointestinal system: Abdomen is nondistended, soft and right upper quadrant tenderness improving. No organomegaly or masses felt. Normal bowel sounds heard. Central nervous system: Alert and responsive and smiling. Extremities: Symmetric 5 x 5 power. Skin: No rashes, lesions or ulcers Psychiatry: Mood & affect appropriate.    Data Reviewed: Lab results reviewed.  Author: Marrion Coy, MD 12/14/2022 10:51 AM  For on call review [email or web address].

## 2022-12-14 NOTE — Progress Notes (Signed)
CC: Acute acalculous cholecystitis Subjective: The patient was seen this morning in the ICU.  This was prior to her transfer.  She does seem more alert to me.  She is able to track although I cannot get her to speak any sentences.  Her vital signs are improved and she has not been hypotensive over the last day.  She has had no fevers.  Objective: Vital signs in last 24 hours: Temp:  [97.4 F (36.3 C)-99.1 F (37.3 C)] 98.6 F (37 C) (12/02 1305) Pulse Rate:  [64-83] 83 (12/02 1305) Resp:  [13-30] 16 (12/02 1305) BP: (101-148)/(65-99) 143/87 (12/02 1305) SpO2:  [93 %-100 %] 93 % (12/02 1305) Weight:  [58.4 kg] 58.4 kg (12/02 0435) Last BM Date : 12/14/22  Intake/Output from previous day: 12/01 0701 - 12/02 0700 In: 2877.2 [I.V.:1466.1; NG/GT:153; IV Piggyback:1258] Out: 850 [Urine:850] Intake/Output this shift: Total I/O In: 100 [NG/GT:100] Out: 650 [Urine:650]  Physical exam:  Abdomen is soft, slightly distended.  She does grimace when I palpate her right upper quadrant with deep palpation.  This seems to be improved however from my exam yesterday morning  Lab Results: CBC  Recent Labs    12/13/22 0303 12/14/22 0303  WBC 25.2* 21.7*  HGB 9.6* 8.5*  HCT 28.7* 25.8*  PLT 401* 356   BMET Recent Labs    12/13/22 0303 12/14/22 0303  NA 136 137  K 3.9 3.7  CL 102 108  CO2 25 22  GLUCOSE 146* 105*  BUN 21 11  CREATININE 0.67 0.60  CALCIUM 7.8* 7.9*   PT/INR Recent Labs    12/13/22 0303  LABPROT 16.0*  INR 1.3*   ABG No results for input(s): "PHART", "HCO3" in the last 72 hours.  Invalid input(s): "PCO2", "PO2"  Studies/Results: CT HEAD WO CONTRAST ( )  Result Date: 12/13/2022 CLINICAL DATA:  Head trauma, moderate to severe. EXAM: CT HEAD WITHOUT CONTRAST TECHNIQUE: Contiguous axial images were obtained from the base of the skull through the vertex without intravenous contrast. RADIATION DOSE REDUCTION: This exam was performed according to the  departmental dose-optimization program which includes automated exposure control, adjustment of the mA and/or kV according to patient size and/or use of iterative reconstruction technique. COMPARISON:  CT head without contrast 12/08/2022 and 12/04/2022. FINDINGS: Brain: Bilateral burr holes again noted. The surgical drains were removed. Left greater than right pneumocephalus remains. The collections are decompressed bilaterally residual bilateral 5-6 mm extra-axial collections are present. No acute hemorrhage is present in either collection. Moderate atrophy and white matter changes are present. The ventricles are proportionate to the degree of atrophy. Deep brain nuclei are within normal limits. No acute or focal cortical infarcts are present. The brainstem and cerebellum are within normal limits. Vascular: No hyperdense vessel or unexpected calcification. Skull: The calvarium is otherwise intact. Skin stables are in place over the burr holes. Sinuses/Orbits: Mild mucosal thickening is present in the right maxillary and sphenoid sinus. No fluid levels are present. The globes and orbits are within normal limits. Right-sided NG tube is in place. IMPRESSION: 1. Interval removal of bilateral surgical drains with partial decompression of bilateral extra-axial collections. 2. Residual bilateral 5-6 mm extra-axial collections. 3. No acute hemorrhage. 4. Moderate atrophy and white matter disease likely reflects the sequela of chronic microvascular ischemia. Electronically Signed   By: Marin Roberts M.D.   On: 12/13/2022 14:21   DG Chest Port 1 View  Result Date: 12/12/2022 CLINICAL DATA:  Fever EXAM: PORTABLE CHEST 1 VIEW COMPARISON:  Chest  x-ray 12/09/2022 FINDINGS: Enteric tube extends below the diaphragm. There some increasing retrocardiac opacities. Right lung is clear. No pleural effusion or pneumothorax. Cardiomediastinal silhouette is within normal limits. No acute fractures are seen. IMPRESSION:  Increasing retrocardiac opacities, atelectasis versus pneumonia. Electronically Signed   By: Darliss Cheney M.D.   On: 12/12/2022 20:15   CT ABDOMEN WO CONTRAST  Result Date: 12/12/2022 CLINICAL DATA:  Preoperative for gastrostomy. EXAM: CT ABDOMEN WITHOUT CONTRAST TECHNIQUE: Multidetector CT imaging of the abdomen was performed following the standard protocol without IV contrast. RADIATION DOSE REDUCTION: This exam was performed according to the departmental dose-optimization program which includes automated exposure control, adjustment of the mA and/or kV according to patient size and/or use of iterative reconstruction technique. COMPARISON:  Abdominal radiographs 12/09/2022 FINDINGS: Lower chest: Moderate left and small right pleural effusions with basilar atelectasis or consolidation bilaterally. Hepatobiliary: No focal liver lesions. The gallbladder is distended with gallbladder wall thickening and pericholecystic edema/stranding. No stones are identified. The appearance is likely to represent cholecystitis, possibly acalculous cholecystitis or occult stones. No bile duct dilatation. Pancreas: Unremarkable. No pancreatic ductal dilatation or surrounding inflammatory changes. Spleen: Normal in size without focal abnormality. Adrenals/Urinary Tract: Adrenal glands are unremarkable. Kidneys are normal, without renal calculi, focal lesion, or hydronephrosis. Stomach/Bowel: Enteric tube terminates in the body of the stomach. No gastric wall thickening or distention. Visualized portions of small and large bowel are not abnormally distended. Stool-filled colon. Vascular/Lymphatic: Aortic atherosclerosis. No enlarged abdominal lymph nodes. Other: No free air or free fluid in the abdomen. Abdominal wall musculature appears intact. Musculoskeletal: Degenerative changes in the spine. IMPRESSION: 1. Gallbladder distention with gallbladder wall thickening and pericholecystic edema/stranding. Changes suggest acute  cholecystitis. No stones are identified. Electronically Signed   By: Burman Nieves M.D.   On: 12/12/2022 19:02    Anti-infectives: Anti-infectives (From admission, onward)    Start     Dose/Rate Route Frequency Ordered Stop   12/12/22 2200  piperacillin-tazobactam (ZOSYN) IVPB 3.375 g        3.375 g 12.5 mL/hr over 240 Minutes Intravenous Every 8 hours 12/12/22 2042     12/12/22 1930  cefTRIAXone (ROCEPHIN) 2 g in sodium chloride 0.9 % 100 mL IVPB  Status:  Discontinued        2 g 200 mL/hr over 30 Minutes Intravenous Every 24 hours 12/12/22 1840 12/12/22 2042   12/07/22 1520  ceFAZolin (ANCEF) IVPB 2g/100 mL premix        2 g 200 mL/hr over 30 Minutes Intravenous 30 min pre-op 12/07/22 1520 12/07/22 1638       Assessment/Plan:  72 year old who was recently admitted for subdural hematomas who has undergone evacuation.  Her drain has been removed but she started to have fevers.  She has a leukocytosis.  On CT scan there is concern for acalculous cholecystitis.   Her pain is improved and her vital signs have also improved today.  Her leukocytosis has gone down as well.  Again would continue to treat her with antibiotics.  I would hold n.p.o. for today and if continues to get better tomorrow can likely start tube feeds.  If she has a backslide it starts to become he reported apically unstable, spikes fevers or has worsening pain then she would need a cholecystostomy tube  Baker Pierini, M.D. [facility]

## 2022-12-14 NOTE — Plan of Care (Signed)
  Problem: Health Behavior/Discharge Planning: Goal: Ability to manage health-related needs will improve Outcome: Progressing   Problem: Clinical Measurements: Goal: Will remain free from infection Outcome: Progressing Goal: Cardiovascular complication will be avoided Outcome: Progressing   Problem: Activity: Goal: Risk for activity intolerance will decrease Outcome: Progressing   Problem: Nutrition: Goal: Adequate nutrition will be maintained Outcome: Not Progressing   Problem: Skin Integrity: Goal: Risk for impaired skin integrity will decrease Outcome: Progressing   Problem: Clinical Measurements: Goal: Cardiovascular complication will be avoided Outcome: Progressing   Problem: Skin Integrity: Goal: Risk for impaired skin integrity will decrease Outcome: Progressing   Problem: Nutrition: Goal: Adequate nutrition will be maintained Outcome: Not Progressing

## 2022-12-14 NOTE — Telephone Encounter (Signed)
Patient admitted in ICU

## 2022-12-14 NOTE — Progress Notes (Signed)
Hypoglycemic Event  CBG: 69  Treatment: D50 25 mL (12.5 gm)  Symptoms: None  Follow-up CBG: Time:1624 CBG Result:90  Possible Reasons for Event: Unknown  Comments/MD notified:Dr. Chipper Herb notified    Christine Hull

## 2022-12-14 NOTE — Progress Notes (Signed)
Neurosurgery Progress Note  History: Christine Hull POD 6 s/p bilateral burr holes for  bilateral subdural hematomas with symptomatic mass effect.   Per history, baseline is Ox2.    24-hour events: 12/1: Stable today and brighter on exam. Moving more spontaneously.  She appears to be having some abdominal pain and given recent increase in WBC, labs ordered and CXR showing possible pneumonia   Hospital Course 11/28: Patient appears at baseline with eyes open and nonverbal, she is moving extremities.  Her drains do have some continued output.  11/29: Stable today. DVT prophylaxis started. Subdural drains removed today  11/30: Patient doing well after drain removal yesterday.  No changes neurologically.  Incisions remain clean and dry  Physical Exam: Vitals:   12/14/22 0800 12/14/22 0900  BP: (!) 143/79 (!) 117/92  Pulse: 69 64  Resp: 15 17  Temp:  98.5 F (36.9 C)  SpO2: 100% 97%    Eyes open spontaneously, but does not follow commands.  Nonverbal.   Intermittently mumbles Moves all extremities x4 symmetrically  Dressings and drains removed, incisions clean and dry  Data:  Output by Drain (mL) 12/12/22 0701 - 12/12/22 1900 12/12/22 1901 - 12/13/22 0700 12/13/22 0701 - 12/13/22 1900 12/13/22 1901 - 12/14/22 0700 12/14/22 0701 - 12/14/22 0931  Requested LDAs do not have output data documented.    CT head without contrast: IMPRESSION: Motion degraded exam.   1. Interval postsurgical changes from bilateral burr hole craniotomies and placement of subdural drainage catheters. The right-sided drainage catheter terminates in the craniotomy defect. 2. Postoperative pneumocephalus causes mass effect on the bilateral frontal lobes resulting in a somewhat peaked appearance of the bilateral frontal lobes. While nonspecific, this appearance could be seen in the setting of tension pneumocephalus, which is not excluded. 3. Interval decrease in size of bilateral subdural  collections which now measure up to 5 mm on the right and 6 mm on the left, previously 1.5 cm on the right 1.1 cm on the left. Interval resolution of midline shift. 4. Slight interval increase in size of the right temporal horn, which may be secondary to decreased mass effect, but early hydrocephalus is not entirely excluded. Recommend attention on follow up.   Assessment/Plan:  [name] POD 5 s/p bilateral burr holes for  bilateral subdural hematomas with symptomatic mass effect.   Patient being treated for likely pneumonia and medicine helping with possible cholecystitis. We will get updated CT to monitor improvement.    -Okay for transfer to the floor from neurosurgery perspective -CT head on 11/26 with resolution of midline shift, no further imaging needed at this time.  If patient has a change in neurologic exam, stat CT head should be ordered -Drains removed 11/29 -Okay for DVT prophylaxis - Dispo per medical team  Lucy Chris, MD

## 2022-12-14 NOTE — Progress Notes (Signed)
Patient alert, able to track RN in room, stable pressures, on room air.  Patient occasionally speaks one-two words that seem appropriate to situation. Unable to follow commands.  Husband at bedside, updated.

## 2022-12-14 NOTE — Progress Notes (Signed)
   Neurosurgery Progress Note  History: Christine Hull is s/p bilateral burr holes for evacuation of bilateral subdural hematomas with mass effect  POD7: NAEO  Physical Exam: Vitals:   12/14/22 0500 12/14/22 0600  BP: 118/79 135/75  Pulse: 70 67  Resp: 15 17  Temp:    SpO2: 99% 99%    Awake and alert. Says yes intermittently and not appropriately  CNI Does not follow commands.  Moves all extremities spontaneously. Incisions c.d.I with staples in place  Data:  Other tests/results:  See results review  Assessment/Plan:  Christine Hull is a 72 year old presenting after an unwitnessed fall several weeks ago found to have bilateral subdural hematomas with underlying mass effect status post bilateral bur holes for evacuation on 12/07/2022  - ok to transfer to floor from neurosurgical standpoint - CT head on 11/26 with resolution of midline shift, no further imaging needed at this time.  If patient has a change in neurologic exam, stat CT head should be ordered  - drains removed 12/11/22 - ok for DVT ppx from neurosurgical standpoint - staples can be removed on 12/9. - remainder of care per medical team.  Manning Charity PA-C Department of Neurosurgery

## 2022-12-14 NOTE — Progress Notes (Addendum)
Daily Progress Note   Patient Name: Christine Hull       Date: 12/14/2022 DOB: 1950/02/11  Age: 72 y.o. MRN#: [id number] Attending Physician: Marrion Coy, MD Primary Care Physician: Annita Brod, MD Admit Date: 12/04/2022  Reason for Consultation/Follow-up: Establishing goals of care  Subjective: Notes labs reviewed.  Per notes, patient's status has improved from a surgery service perspective, and neurosurgery perspective.   In to see patient. She does not awaken to my entry.  She is currently resting in bed no family at bedside.  Continuing full code/full scope treatment.  Length of Stay: 10  Current Medications: Scheduled Meds:   Chlorhexidine Gluconate Cloth  6 each Topical Daily   enoxaparin (LOVENOX) injection  40 mg Subcutaneous Daily   levothyroxine  50 mcg Per Tube Q0600   liothyronine  5 mcg Per Tube Daily   mouth rinse  15 mL Mouth Rinse 4 times per day   sertraline  50 mg Per Tube Daily   sodium chloride flush  3 mL Intravenous Q12H   thiamine  100 mg Per Tube Daily    Continuous Infusions:  dextrose 5 % and 0.9 % NaCl 75 mL/hr at 12/14/22 1518   levETIRAcetam Stopped (12/14/22 0938)   piperacillin-tazobactam (ZOSYN)  IV 12.5 mL/hr at 12/14/22 1518    PRN Meds: acetaminophen **OR** acetaminophen, mouth rinse, oxyCODONE-acetaminophen  Physical Exam Constitutional:      Comments: Eyes closed  HENT:     Head:     Comments: Bilateral incisions to top of head closed by staples.  No signs of infection noted. Pulmonary:     Effort: Pulmonary effort is normal.  Skin:    General: Skin is warm and dry.             Vital Signs: BP (!) 143/87 (BP Location: Right Arm)   Pulse 83   Temp 98.6 F (37 C) (Axillary)   Resp 16   Ht 5\' 5"  (1.651 m)   Wt 58.4 kg    SpO2 93%   BMI 21.42 kg/m  SpO2: SpO2: 93 % O2 Device: O2 Device: Room Air O2 Flow Rate: O2 Flow Rate (L/min): 8 L/min  Intake/output summary:  Intake/Output Summary (Last 24 hours) at 12/14/2022 1524 Last data filed at 12/14/2022 1518 Gross per 24 hour  Intake 1864.88 ml  Output 1250 ml  Net 614.88 ml   LBM: Last BM Date : 12/14/22 Baseline Weight: Weight: 54.8 kg Most recent weight: Weight: 58.4 kg   Patient Active Problem List   Diagnosis Date Noted   Acute acalculous cholecystitis 12/13/2022   Hyponatremia 12/12/2022   Hypophosphatemia 12/11/2022   Protein-calorie malnutrition, severe 12/09/2022   Traumatic subdural hematoma without loss of consciousness (HCC) 12/04/2022   History of fall 12/04/2022   Acute metabolic encephalopathy 12/04/2022   History of CVA (cerebrovascular accident) 12/04/2022   Traumatic subarachnoid hemorrhage (HCC) 12/04/2022   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) 08/09/2016   Late effect of lacunar infarction 07/19/2016   Hyperlipidemia LDL goal <100 04/28/2016   Alcohol abuse 05/31/2015   Cardiac device in situ 05/09/2013   Sinus bradycardia 05/09/2013   Essential hypertension 04/01/2013   Near syncope 02/02/2013   Headache 02/02/2013   VAD (vascular dementia) (HCC) 02/02/2013   Hypokalemia 02/02/2013   Prolonged QT interval 02/02/2013   Encounter for preventive health examination 11/17/2012   Chronic back pain greater than 3 months duration 08/16/2011   Hypothyroidism 08/14/2011   Tobacco abuse 08/13/2011    Palliative Care Assessment & Plan   Recommendations/Plan: PMT following along. Full Code/Full Scope Treatment  Code Status:    Code Status Orders  (From admission, onward)           Start     Ordered   12/04/22 2257  Full code  Continuous       Question:  By:  Answer:  Other   12/04/22 2257           Code Status History     Date Active Date Inactive Code Status Order ID Comments User  Context   02/02/2013 1332 02/03/2013 2033 Full Code [id number]  Alba Cory, MD ED      Thank you for allowing the Palliative Medicine Team to assist in the care of this patient.    Morton Stall, NP  Please contact Palliative Medicine Team phone at [phone] for questions and concerns.

## 2022-12-15 DIAGNOSIS — K810 Acute cholecystitis: Secondary | ICD-10-CM | POA: Diagnosis not present

## 2022-12-15 DIAGNOSIS — Z7189 Other specified counseling: Secondary | ICD-10-CM | POA: Diagnosis not present

## 2022-12-15 DIAGNOSIS — S066X0A Traumatic subarachnoid hemorrhage without loss of consciousness, initial encounter: Secondary | ICD-10-CM | POA: Diagnosis not present

## 2022-12-15 DIAGNOSIS — F01C18 Vascular dementia, severe, with other behavioral disturbance: Secondary | ICD-10-CM | POA: Diagnosis not present

## 2022-12-15 LAB — CBC
HCT: 29.1 % — ABNORMAL LOW (ref 36.0–46.0)
Hemoglobin: 9.7 g/dL — ABNORMAL LOW (ref 12.0–15.0)
MCH: 30.5 pg (ref 26.0–34.0)
MCHC: 33.3 g/dL (ref 30.0–36.0)
MCV: 91.5 fL (ref 80.0–100.0)
Platelets: 381 10*3/uL (ref 150–400)
RBC: 3.18 MIL/uL — ABNORMAL LOW (ref 3.87–5.11)
RDW: 13.4 % (ref 11.5–15.5)
WBC: 12.5 10*3/uL — ABNORMAL HIGH (ref 4.0–10.5)
nRBC: 0 % (ref 0.0–0.2)

## 2022-12-15 LAB — GLUCOSE, CAPILLARY
Glucose-Capillary: 101 mg/dL — ABNORMAL HIGH (ref 70–99)
Glucose-Capillary: 110 mg/dL — ABNORMAL HIGH (ref 70–99)
Glucose-Capillary: 112 mg/dL — ABNORMAL HIGH (ref 70–99)
Glucose-Capillary: 83 mg/dL (ref 70–99)
Glucose-Capillary: 91 mg/dL (ref 70–99)
Glucose-Capillary: 91 mg/dL (ref 70–99)

## 2022-12-15 MED ORDER — FREE WATER
75.0000 mL | Status: DC
  Administered 2022-12-15 – 2022-12-22 (×40): 75 mL

## 2022-12-15 MED ORDER — OSMOLITE 1.2 CAL PO LIQD
1000.0000 mL | ORAL | Status: DC
  Administered 2022-12-15 – 2022-12-21 (×9): 1000 mL

## 2022-12-15 NOTE — Progress Notes (Signed)
Per Dr Zhang, dc tele monitoring  

## 2022-12-15 NOTE — Progress Notes (Signed)
Progress Note   Patient: Christine Hull WUJ:[id number] DOB: 15-Oct-1950 DOA: 12/04/2022     11 DOS: the patient was seen and examined on 12/15/2022   Brief hospital course: 72 year old female with past medical history of hypothyroidism, hypertension, prior stroke with vascular dementia brought into the ED after decreased mental status and not walking as much.  She had a fall around a week ago.  In the ER found to have bilateral subdural hematomas with resultant 4 mm midline shift to the left and possible subarachnoid hemorrhage.  Bilateral burr hole procedure done by neurosurgery on 11/25.   11/26. Decrease in size of bilateral subdural collections 5 mm on the right (was 1.5 cm previously), 6 mm on the left (was previously 1.1 cm).  Resolution of midline shift. 11/27.  Patient is evaluated by speech therapy, still not safe to for oral intake.  Place NG tube start tube feeding. 11/29.  Neurosurgery has removed the drain. 11/30.  Patient developed a high fever, CT abdomen showed acalculous cholecystitis.  Started Zosyn. 12/3.  Leukocytosis essentially resolved, restart tube feeding at two thirds of required rate.    Principal Problem:   Traumatic subdural hematoma without loss of consciousness (HCC) Active Problems:   Traumatic subarachnoid hemorrhage (HCC)   Acute metabolic encephalopathy   Hypothyroidism   VAD (vascular dementia) (HCC)   Hypokalemia   Essential hypertension   Sinus bradycardia   History of fall   History of CVA (cerebrovascular accident)   Protein-calorie malnutrition, severe   Hypophosphatemia   Hyponatremia   Acute acalculous cholecystitis   Assessment and Plan: Acute acalculous cholecystitis. Severe sepsis. Patient developed significant fever and tachycardia, CT abdomen pelvis showed acute acalculous cholecystitis 11/30.  Patient was started on Zosyn. General surgery has seen the patient, currently will be treated with antibiotics, general surgery seen the  patient, no need for intervention. Patient was hypotensive 12/1, received a liter of fluid bolus.  Lactic acid was normal.  Blood pressure has been stabilized since. Patient condition is much improved, leukocytosis much better.  Discussed with general surgery, they are okay for restart NG tube feeding with two thirds of normal rate.  will reconsider PEG tube placement after  stable on tube feeding with NG tube.   * Traumatic subdural hematoma without loss of consciousness (HCC) Traumatic subarachnoid hemorrhage History of fall with head trauma 2 weeks prior to admission Bilateral burr hole procedure done on 11/25.  Decrease in the amount of blood on subdural hematomas and resolution of midline shift.  Patient with bilateral drains.  Patient has been seen by speech therapy, still not safe for any p.o. intake, given her altered mental status.  NG tube was placed on 11/27 after discussion with husband, tube feeding started.  Patient seem to be tolerating well.  Neurosurgery has removed the drain on 11/29.  Patient has been stable from neurosurgical standpoint. Tube feeding was discontinued on 12/1 due to acute cholecystitis.  Will restart today.     Acute metabolic encephalopathy VAD (vascular dementia) (HCC) History of stroke. Failure to thrive. Patient has severe dementia at baseline, she is nonverbal.  She has a worsening mental status since admission.  Her dementia probably will be getting worse after brain bleed.  Long-term prognosis very poor, palliative care consult obtained.   Hypokalemia. Hypophosphatemia. Hyponatremia. Conditions are resolved, continue monitoring.    Hypoglycemia secondary to poor p.o. intake. Start tube feeding.     Essential hypertension Discontinued losartan due to low blood pressure.   Hypothyroidism  Continue levothyroxine      Subjective:  Patient is nonverbal, currently she has no complaint.  Physical Exam: Vitals:   12/15/22 0020 12/15/22 0052  12/15/22 0428 12/15/22 0731  BP:  (!) 121/95 (!) 132/92 133/87  Pulse: 70 76 68 65  Resp:   16 20  Temp:  98.7 F (37.1 C) 97.6 F (36.4 C) 98.3 F (36.8 C)  TempSrc:      SpO2:  100% 100% 100%  Weight:      Height:       General exam: Appears calm and comfortable  Respiratory system: Clear to auscultation. Respiratory effort normal. Cardiovascular system: S1 & S2 heard, RRR. No JVD, murmurs, rubs, gallops or clicks. No pedal edema. Gastrointestinal system: Abdomen is nondistended, soft and RUQ tender. No organomegaly or masses felt. Normal bowel sounds heard. Central nervous system: Alert and nonverbal. No focal neurological deficits. Extremities: Symmetric 5 x 5 power. Skin: No rashes, lesions or ulcers    Data Reviewed:  Lab results reviewed.  Family Communication: Husband updated at bedside.  Disposition: Status is: Inpatient Remains inpatient appropriate because: Severity of disease,     Time spent: 55 minutes  Author: Marrion Coy, MD 12/15/2022 12:48 PM  For on call review [email or web address].

## 2022-12-15 NOTE — Plan of Care (Signed)

## 2022-12-15 NOTE — Progress Notes (Signed)
Daily Progress Note   Patient Name: Christine Hull       Date: 12/15/2022 DOB: [birth year]  Age: 72 y.o. MRN#: [id number] Attending Physician: Marrion Coy, MD Primary Care Physician: Annita Brod, MD Admit Date: 12/04/2022  Reason for Consultation/Follow-up: Establishing goals of care  Subjective: Notes and labs reviewed.  In to see patient.  Patient is currently in a semiprivate room sleeping in bed, with her husband at bedside.  Husband states he is retired from a [employer]up[employer]ist.  He discusses that he has been married to his wife for 50 years and they have children together.  He discusses her decline with dementia.  He discusses that she was a woman that did not want assistance with ADLs, but would allow care from paid caregivers.  He states with further cognitive decline she allowed her family to help with ADLs.  He discusses that at baseline she was ambulatory, and was able to feed herself at times.  We discussed her diagnosis, prognosis, GOC, EOL wishes disposition and options.  Created space and opportunity for patient  to explore thoughts and feelings regarding current medical information.   A detailed discussion was had today regarding advanced directives.  Concepts specific to code status, artifical feeding and hydration, IV antibiotics and rehospitalization were discussed.  The difference between an aggressive medical intervention path and a comfort care path was discussed.  Values and goals of care important to patient and family were attempted to be elicited.  Discussed limitations of medical interventions to prolong quality of life in some situations and discussed the concept of human mortality.  Husband states he is a man of faith as is his wife.  Husband states his wife's  current quality of life is not acceptable, but he has spoken with his family and they would like to give her time to see how she does.  Discussed family having a conversation regarding boundaries of an acceptable quality of life, goals for improvement, and timelines for those improvements.    He states they are amenable to a PEG tube.  Patient is currently wearing mitten restraints to keep NG tube in place. Discussed the ease at which a PEG tube can be removed by the patient if the patient wishes.  Patient awoke at the end of my visit, but unable to understand what  she was trying to say.    Husband with concerns regarding patient's care and hospital experience.  Charge RN made aware and per conversation A/C will be speaking with patient's husband.     Length of Stay: 11  Current Medications: Scheduled Meds:   enoxaparin (LOVENOX) injection  40 mg Subcutaneous Daily   free water  75 mL Per Tube Q4H   levothyroxine  50 mcg Per Tube Q0600   liothyronine  5 mcg Per Tube Daily   sertraline  50 mg Per Tube Daily   sodium chloride flush  3 mL Intravenous Q12H   thiamine  100 mg Per Tube Daily    Continuous Infusions:  dextrose 5 % and 0.9 % NaCl 75 mL/hr at 12/15/22 0436   feeding supplement (OSMOLITE 1.2 CAL)     levETIRAcetam 500 mg (12/15/22 9147)   piperacillin-tazobactam (ZOSYN)  IV 3.375 g (12/15/22 0607)    PRN Meds: acetaminophen **OR** acetaminophen, mouth rinse, oxyCODONE-acetaminophen  Physical Exam Pulmonary:     Effort: Pulmonary effort is normal.  Neurological:     Mental Status: She is alert.             Vital Signs: BP 133/87 (BP Location: Right Arm)   Pulse 65   Temp 98.3 F (36.8 C)   Resp 20   Ht 5\' 5"  (1.651 m)   Wt 58.4 kg   SpO2 100%   BMI 21.42 kg/m  SpO2: SpO2: 100 % O2 Device: O2 Device: Room Air O2 Flow Rate: O2 Flow Rate (L/min): 8 L/min  Intake/output summary:  Intake/Output Summary (Last 24 hours) at 12/15/2022 1241 Last data filed at  12/15/2022 0849 Gross per 24 hour  Intake 534.31 ml  Output 1450 ml  Net -915.69 ml   LBM: Last BM Date : 12/14/22 (smear) Baseline Weight: Weight: 54.8 kg Most recent weight: Weight: 58.4 kg        Patient Active Problem List   Diagnosis Date Noted   Acute acalculous cholecystitis 12/13/2022   Hyponatremia 12/12/2022   Hypophosphatemia 12/11/2022   Protein-calorie malnutrition, severe 12/09/2022   Traumatic subdural hematoma without loss of consciousness (HCC) 12/04/2022   History of fall 12/04/2022   Acute metabolic encephalopathy 12/04/2022   History of CVA (cerebrovascular accident) 12/04/2022   Traumatic subarachnoid hemorrhage (HCC) 12/04/2022   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) 08/09/2016   Late effect of lacunar infarction 07/19/2016   Hyperlipidemia LDL goal <100 04/28/2016   Alcohol abuse 05/31/2015   Cardiac device in situ 05/09/2013   Sinus bradycardia 05/09/2013   Essential hypertension 04/01/2013   Near syncope 02/02/2013   Headache 02/02/2013   VAD (vascular dementia) (HCC) 02/02/2013   Hypokalemia 02/02/2013   Prolonged QT interval 02/02/2013   Encounter for preventive health examination 11/17/2012   Chronic back pain greater than 3 months duration 08/16/2011   Hypothyroidism 08/14/2011   Tobacco abuse 08/13/2011    Palliative Care Assessment & Plan     Recommendations/Plan: Continue full code/full scope at this time.  Code Status:    Code Status Orders  (From admission, onward)           Start     Ordered   12/04/22 2257  Full code  Continuous       Question:  By:  Answer:  Other   12/04/22 2257           Code Status History     Date Active Date Inactive Code Status Order ID Comments User  Context   02/02/2013 1332 02/03/2013 2033 Full Code [id number]  Alba Cory, MD ED        Care plan was discussed with charge RN  Thank you for allowing the Palliative Medicine Team to assist in the care  of this patient.    Morton Stall, NP  Please contact Palliative Medicine Team phone at [phone] for questions and concerns.

## 2022-12-15 NOTE — Progress Notes (Signed)
[facility] SURGICAL PROGRESS NOTE (cpt [phone])  Hospital Day(s): 11.   Post op day(s): 8 Days Post-Op.   Interval History: Patient seen and examined, no acute events or new complaints overnight. Patient sleeping. She is unable to provide any history. Leukocytosis with marked improvements; now 12.5K (from 21.7K). She continues on Zosyn.   Review of Systems:  Unable to reliably perform secondary to non-verbal status   Vital signs in last 24 hours: [min-max] current  Temp:  [97.2 F (36.2 C)-98.7 F (37.1 C)] 98.3 F (36.8 C) (12/03 0731) Pulse Rate:  [64-83] 65 (12/03 0731) Resp:  [15-20] 20 (12/03 0731) BP: (108-149)/(74-99) 133/87 (12/03 0731) SpO2:  [93 %-100 %] 100 % (12/03 0731)     Height: 5\' 5"  (165.1 cm) Weight: 58.4 kg BMI (Calculated): 21.42   Intake/Output last 2 shifts:  12/02 0701 - 12/03 0700 In: 574.3 [I.V.:218.8; NG/GT:100; IV Piggyback:255.6] Out: 1600 [Urine:1600]   Physical Exam:  Constitutional: alert, cooperative and no distress  HENT NGT in place Eyes: PERRL, EOM's grossly intact and symmetric  Respiratory: breathing non-labored at rest  Cardiovascular: regular rate and sinus rhythm  Gastrointestinal: soft, she does wince with palpation of RUQ, and non-distended.    Labs:     Latest Ref Rng & Units 12/15/2022    8:02 AM 12/14/2022    3:03 AM 12/13/2022    3:03 AM  CBC  WBC 4.0 - 10.5 K/uL 12.5  21.7  25.2   Hemoglobin 12.0 - 15.0 g/dL 9.7  8.5  9.6   Hematocrit 36.0 - 46.0 % 29.1  25.8  28.7   Platelets 150 - 400 K/uL 381  356  401       Latest Ref Rng & Units 12/14/2022    3:03 AM 12/13/2022    3:03 AM 12/12/2022    3:08 AM  CMP  Glucose 70 - 99 mg/dL 657  846  962   BUN 8 - 23 mg/dL 11  21  16    Creatinine 0.44 - 1.00 mg/dL 9.52  8.41  3.24   Sodium 135 - 145 mmol/L 137  136  133   Potassium 3.5 - 5.1 mmol/L 3.7  3.9  4.2   Chloride 98 - 111 mmol/L 108  102  101   CO2 22 - 32 mmol/L 22  25  23    Calcium 8.9 - 10.3 mg/dL  7.9  7.8  8.0   Total Protein 6.5 - 8.1 g/dL   6.9   Total Bilirubin <1.2 mg/dL   1.3   Alkaline Phos 38 - 126 U/L   145   AST 15 - 41 U/L   36   ALT 0 - 44 U/L   26      Imaging studies: No new pertinent imaging studies   Assessment/Plan: (ICD-10's: K81.0) 72 y.o. female with acalculous cholecystitis complicated by history of bilateral subdural hematomas s/p burr hole 11/25, non-verbal status    - Although she winces with some palpation to RUQ, she otherwise is afebrile with resolution in her leukocytosis. I think we can start tube feedings slowly and monitor response.   - For now, I think we can hold off on percutaneous cholecystostomy tube placement. She is certainly not a surgical candidate in any setting and this will likely be a lifelong intervention. If she deteriorates, would recommend placement.   - Continue IV Abx (Zosyn)  - Monitor abdominal examination  - Monitor leukocytosis; resolved  - Further management per primary service; we will follow  All of the above findings and recommendations were discussed with the medical team. No family present    -- Lynden Oxford, PA-C [facility] 12/15/2022, 8:58 AM M-F: 7am - 4pm

## 2022-12-15 NOTE — Progress Notes (Signed)
Nutrition Follow-up  DOCUMENTATION CODES:   Severe malnutrition in context of social or environmental circumstances  INTERVENTION:   -TF via NGT:   Initiate Osmolite 1.2 @ 25 ml/hr and increase by 10 ml every 4 hours to goal rate of 65 ml/hr.   75 ml free water flush every 4 hours  Tube feeding regimen provides 1872 kcal (100% of needs), 87 grams of protein, and 1279 ml of H2O.  Total free water: 1729 ml daily  NUTRITION DIAGNOSIS:   Severe Malnutrition related to social / environmental circumstances as evidenced by severe fat depletion, severe muscle depletion.  Ongoing  GOAL:   Patient will meet greater than or equal to 90% of their needs  Progressing   MONITOR:   Diet advancement, Labs, Weight trends, TF tolerance, Skin, I & O's  REASON FOR ASSESSMENT:   Consult Enteral/tube feeding initiation and management  ASSESSMENT:   72 y/o female with h/o hypothyroidism, HTN, HLD, CVA, mood disorder and dementia who is admitted with chronic bilateral subdural hematoma now s/p right & left sided burr holes for drainage of subdural hematoma 11/25.  11/27- s/p BSE- NPO, NGT placed, TF initiated 11/29- subdural drains removed by neurosurgery 11/30- s/p BSE- recommend continued NPO, pt developed fever- CT abdomen showed acalculous cholecystitis- zosyn started   Reviewed I/O's: -1 L x 24 hours and +4.7 L since admission  UOP: 1.6 L x 24 hours  Pt lying in bed. NGT clamped. No family at bedside. Pt unable to provide additional history.   Per MD notes, TF d/c on 12/13/22 secondary to cholecystitis. Plan to treat with antibiotics; no plan for cholecystostomy tube unless pt deteriorates (this would be lifelong). Plan to re-start TF today per general surgery.   Palliative care following for goals of care discussions. Pt family interested in pursuing PEG per previous discussions. PEG was initially planned for 12/14/22, however, cancelled due to changes in medical status.   No wt  loss since admission.   Medications reviewed and include thiamine, lovenox, keppra, and dextrose 5%-0.9% sodium chloride infusion @ 75 ml/hr.   Labs reviewed: CBGS: 69-101 (inpatient orders for glycemic control are none).    Diet Order:   Diet Order             Diet NPO time specified  Diet effective now                   EDUCATION NEEDS:   No education needs have been identified at this time  Skin:  Skin Assessment: Skin Integrity Issues: Skin Integrity Issues:: Incisions Incisions: closed head  Last BM:  12/14/22 (type 6)  Height:   Ht Readings from Last 1 Encounters:  12/07/22 5\' 5"  (1.651 m)    Weight:   Wt Readings from Last 1 Encounters:  12/14/22 58.4 kg    Ideal Body Weight:  56.8 kg  BMI:  Body mass index is 21.42 kg/m.  Estimated Nutritional Needs:   Kcal:  1700-1900  Protein:  85-100 grams  Fluid:  > 1.7 L    Levada Schilling, RD, LDN, CDCES Registered Dietitian III Certified Diabetes Care and Education Specialist Please refer to [facility] for RD and/or RD on-call/weekend/after hours pager

## 2022-12-16 DIAGNOSIS — Z7189 Other specified counseling: Secondary | ICD-10-CM | POA: Diagnosis not present

## 2022-12-16 DIAGNOSIS — I609 Nontraumatic subarachnoid hemorrhage, unspecified: Secondary | ICD-10-CM | POA: Diagnosis not present

## 2022-12-16 DIAGNOSIS — K810 Acute cholecystitis: Secondary | ICD-10-CM | POA: Diagnosis not present

## 2022-12-16 LAB — BASIC METABOLIC PANEL
Anion gap: 8 (ref 5–15)
BUN: 8 mg/dL (ref 8–23)
CO2: 25 mmol/L (ref 22–32)
Calcium: 8.2 mg/dL — ABNORMAL LOW (ref 8.9–10.3)
Chloride: 106 mmol/L (ref 98–111)
Creatinine, Ser: 0.66 mg/dL (ref 0.44–1.00)
GFR, Estimated: >60 mL/min (ref >60)
Glucose, Bld: 127 mg/dL — ABNORMAL HIGH (ref 70–99)
Potassium: 3.1 mmol/L — ABNORMAL LOW (ref 3.5–5.1)
Sodium: 139 mmol/L (ref 135–145)

## 2022-12-16 LAB — CBC
HCT: 26.5 % — ABNORMAL LOW (ref 36.0–46.0)
Hemoglobin: 8.9 g/dL — ABNORMAL LOW (ref 12.0–15.0)
MCH: 30 pg (ref 26.0–34.0)
MCHC: 33.6 g/dL (ref 30.0–36.0)
MCV: 89.2 fL (ref 80.0–100.0)
Platelets: 447 10*3/uL — ABNORMAL HIGH (ref 150–400)
RBC: 2.97 MIL/uL — ABNORMAL LOW (ref 3.87–5.11)
RDW: 13.2 % (ref 11.5–15.5)
WBC: 10.7 10*3/uL — ABNORMAL HIGH (ref 4.0–10.5)
nRBC: 0.2 % (ref 0.0–0.2)

## 2022-12-16 LAB — GLUCOSE, CAPILLARY
Glucose-Capillary: 110 mg/dL — ABNORMAL HIGH (ref 70–99)
Glucose-Capillary: 119 mg/dL — ABNORMAL HIGH (ref 70–99)
Glucose-Capillary: 120 mg/dL — ABNORMAL HIGH (ref 70–99)
Glucose-Capillary: 123 mg/dL — ABNORMAL HIGH (ref 70–99)
Glucose-Capillary: 124 mg/dL — ABNORMAL HIGH (ref 70–99)
Glucose-Capillary: 136 mg/dL — ABNORMAL HIGH (ref 70–99)
Glucose-Capillary: 137 mg/dL — ABNORMAL HIGH (ref 70–99)

## 2022-12-16 LAB — PHOSPHORUS: Phosphorus: 2.9 mg/dL (ref 2.5–4.6)

## 2022-12-16 LAB — MAGNESIUM: Magnesium: 2.1 mg/dL (ref 1.7–2.4)

## 2022-12-16 MED ORDER — MORPHINE SULFATE (PF) 2 MG/ML IV SOLN: 1.0000 mg | Freq: Once | INTRAVENOUS | Status: DC | PRN

## 2022-12-16 NOTE — Plan of Care (Signed)
  Problem: Education: Goal: Knowledge of condition and prescribed therapy will improve Outcome: Progressing   Problem: Cardiac: Goal: Will achieve and/or maintain adequate cardiac output Outcome: Progressing   Problem: Physical Regulation: Goal: Complications related to the disease process, condition or treatment will be avoided or minimized Outcome: Progressing   Problem: Clinical Measurements: Goal: Respiratory complications will improve Outcome: Progressing Goal: Cardiovascular complication will be avoided Outcome: Progressing   Problem: Pain Management: Goal: General experience of comfort will improve Outcome: Progressing   Problem: Safety: Goal: Ability to remain free from injury will improve Outcome: Progressing   Problem: Skin Integrity: Goal: Risk for impaired skin integrity will decrease Outcome: Progressing

## 2022-12-16 NOTE — Progress Notes (Signed)
[facility] SURGICAL PROGRESS NOTE (cpt [phone])  Hospital Day(s): 12.   Interval History: Patient seen and examined, no acute events or new complaints overnight. Patient sleeping. Opens eyes to stimuli. She is unable to provide any history. Leukocytosis with marked improvements; now 10.7K. Mild hypokalemia to 3.1. Renal function normal; sCr - 0.66; UO - 2500 ccs. She continues on Zosyn. Tube feedings restarted; 65 ml/hr  Review of Systems:  Unable to reliably perform secondary to non-verbal status   Vital signs in last 24 hours: [min-max] current  Temp:  [97.8 F (36.6 C)-99 F (37.2 C)] 97.8 F (36.6 C) (12/04 0437) Pulse Rate:  [65-79] 79 (12/04 0437) Resp:  [17-20] 17 (12/04 0437) BP: (112-138)/(61-87) 112/78 (12/04 0437) SpO2:  [100 %] 100 % (12/04 0437) Weight:  [61.3 kg] 61.3 kg (12/04 0423)     Height: 5\' 5"  (165.1 cm) Weight: 61.3 kg BMI (Calculated): 22.49   Intake/Output last 2 shifts:  12/03 0701 - 12/04 0700 In: 1048.3 [NG/GT:852.9; IV Piggyback:195.4] Out: 2500 [Urine:2500]   Physical Exam:  Constitutional: alert, cooperative and no distress  HENT NGT in place; tube feedings running at 65 ml/hr Eyes: PERRL, EOM's grossly intact and symmetric  Respiratory: breathing non-labored at rest  Cardiovascular: regular rate and sinus rhythm  Gastrointestinal: soft, she does wince with palpation of RUQ but this is stable vs improved from yesterday, and non-distended.    Labs:     Latest Ref Rng & Units 12/16/2022    5:03 AM 12/15/2022    8:02 AM 12/14/2022    3:03 AM  CBC  WBC 4.0 - 10.5 K/uL 10.7  12.5  21.7   Hemoglobin 12.0 - 15.0 g/dL 8.9  9.7  8.5   Hematocrit 36.0 - 46.0 % 26.5  29.1  25.8   Platelets 150 - 400 K/uL 447  381  356       Latest Ref Rng & Units 12/16/2022    5:03 AM 12/14/2022    3:03 AM 12/13/2022    3:03 AM  CMP  Glucose 70 - 99 mg/dL 413  244  010   BUN 8 - 23 mg/dL 8  11  21    Creatinine 0.44 - 1.00 mg/dL 2.72  5.36  6.44    Sodium 135 - 145 mmol/L 139  137  136   Potassium 3.5 - 5.1 mmol/L 3.1  3.7  3.9   Chloride 98 - 111 mmol/L 106  108  102   CO2 22 - 32 mmol/L 25  22  25    Calcium 8.9 - 10.3 mg/dL 8.2  7.9  7.8      Imaging studies: No new pertinent imaging studies   Assessment/Plan: (ICD-10's: K81.0) 71 y.o. female with acalculous cholecystitis complicated by history of bilateral subdural hematomas s/p burr hole 11/25, non-verbal status    - Leukocytosis continues to improve without fever and she is tolerating tube feedings without issue. I think we can continue hold off on percutaneous cholecystostomy tube placement. She is certainly not a surgical candidate in this setting and this will likely be a lifelong intervention. If she deteriorates, would recommend placement.   - Continue IV Abx (Zosyn); would complete at least 10 day course   - Monitor abdominal examination  - Monitor leukocytosis; improving  - Further management per primary service   - Discharge Planning; From a gallbladder perspective, her leukocytosis is improving and examination is reassuring. She has been tolerating tube feedings without worsening. Would continue to treat with Abx as  above. We will follow peripherally for now.   All of the above findings and recommendations were discussed with the medical team. No family present    -- Lynden Oxford, PA-C [facility] 12/16/2022, 7:17 AM M-F: 7am - 4pm

## 2022-12-16 NOTE — Progress Notes (Signed)
Daily Progress Note   Patient Name: Christine Hull       Date: 12/16/2022 DOB: 03-14-1950  Age: 72 y.o. MRN#: [id number] Attending Physician: Leeroy Bock, MD Primary Care Physician: Annita Brod, MD Admit Date: 12/04/2022  Reason for Consultation/Follow-up: Establishing goals of care  Subjective: Note and labs reviewed. Patient is resting in bed with eyes closed, no family at bedside. Family wants time for outcomes.   Length of Stay: 12  Current Medications: Scheduled Meds:   enoxaparin (LOVENOX) injection  40 mg Subcutaneous Daily   free water  75 mL Per Tube Q4H   levothyroxine  50 mcg Per Tube Q0600   liothyronine  5 mcg Per Tube Daily   sertraline  50 mg Per Tube Daily   sodium chloride flush  3 mL Intravenous Q12H   thiamine  100 mg Per Tube Daily    Continuous Infusions:  feeding supplement (OSMOLITE 1.2 CAL) 1,000 mL (12/16/22 0617)   levETIRAcetam 500 mg (12/16/22 0749)   piperacillin-tazobactam (ZOSYN)  IV 3.375 g (12/16/22 1436)    PRN Meds: acetaminophen **OR** acetaminophen, mouth rinse, oxyCODONE-acetaminophen  Physical Exam Constitutional:      Comments: Eyes closed.   Pulmonary:     Effort: Pulmonary effort is normal.             Vital Signs: BP 119/74 (BP Location: Right Arm)   Pulse 72   Temp 98.6 F (37 C)   Resp 17   Ht 5\' 5"  (1.651 m)   Wt 61.3 kg   SpO2 98%   BMI 22.49 kg/m  SpO2: SpO2: 98 % O2 Device: O2 Device: Room Air O2 Flow Rate: O2 Flow Rate (L/min): 8 L/min  Intake/output summary:  Intake/Output Summary (Last 24 hours) at 12/16/2022 1549 Last data filed at 12/16/2022 1500 Gross per 24 hour  Intake 2378.32 ml  Output 1800 ml  Net 578.32 ml   LBM: Last BM Date : 12/15/22 Baseline Weight: Weight: 54.8 kg Most  recent weight: Weight: 61.3 kg    Patient Active Problem List   Diagnosis Date Noted   Acute acalculous cholecystitis 12/13/2022   Hyponatremia 12/12/2022   Hypophosphatemia 12/11/2022   Protein-calorie malnutrition, severe 12/09/2022   Traumatic subdural hematoma without loss of consciousness (HCC) 12/04/2022   History of fall 12/04/2022   Acute metabolic  encephalopathy 12/04/2022   History of CVA (cerebrovascular accident) 12/04/2022   Traumatic subarachnoid hemorrhage (HCC) 12/04/2022   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) 08/09/2016   Late effect of lacunar infarction 07/19/2016   Hyperlipidemia LDL goal <100 04/28/2016   Alcohol abuse 05/31/2015   Cardiac device in situ 05/09/2013   Sinus bradycardia 05/09/2013   Essential hypertension 04/01/2013   Near syncope 02/02/2013   Headache 02/02/2013   VAD (vascular dementia) (HCC) 02/02/2013   Hypokalemia 02/02/2013   Prolonged QT interval 02/02/2013   Encounter for preventive health examination 11/17/2012   Chronic back pain greater than 3 months duration 08/16/2011   Hypothyroidism 08/14/2011   Tobacco abuse 08/13/2011    Palliative Care Assessment & Plan     Recommendations/Plan: Continue current care. Family would like time for outcomes.   Code Status:    Code Status Orders  (From admission, onward)           Start     Ordered   12/04/22 2257  Full code  Continuous       Question:  By:  Answer:  Other   12/04/22 2257           Code Status History     Date Active Date Inactive Code Status Order ID Comments User Context    Thank you for allowing the Palliative Medicine Team to assist in the care of this patient.   Morton Stall, NP  Please contact Palliative Medicine Team phone at [phone] for questions and concerns.

## 2022-12-16 NOTE — Progress Notes (Signed)
Progress Note Patient: Christine Hull BMW:[id number] DOB: [birth year] DOA: 12/04/2022     12 DOS: the patient was seen and examined on 12/16/2022   Brief hospital course: 72 year old female with past medical history of hypothyroidism, hypertension, prior stroke with vascular dementia brought into the ED after decreased alertness and activity level.  She had a fall around a week ago.  In the ER found to have bilateral subdural hematomas with resultant 4 mm midline shift to the left and possible subarachnoid hemorrhage.  Bilateral burr hole procedure done by neurosurgery on 11/25.   11/26. Decrease in size of bilateral subdural collections 5 mm on the right (was 1.5 cm previously), 6 mm on the left (was previously 1.1 cm).  Resolution of midline shift. 11/27.  Patient is evaluated by speech therapy, still not safe to for oral intake.  Place NG tube start tube feeding. 11/29.  Neurosurgery has removed wound the drain. 11/30.  Patient developed a high fever, CT abdomen showed acalculous cholecystitis.  Started Zosyn. 12/3.  Leukocytosis essentially resolved, restart tube feeding at two thirds of required rate. 12/4: minimally responsive. Does not appear to be in acute pain on exam.   Principal Problem:   Traumatic subdural hematoma without loss of consciousness (HCC) Active Problems:   Traumatic subarachnoid hemorrhage (HCC)   Acute metabolic encephalopathy   Hypothyroidism   VAD (vascular dementia) (HCC)   Hypokalemia   Essential hypertension   Sinus bradycardia   History of fall   History of CVA (cerebrovascular accident)   Protein-calorie malnutrition, severe   Hypophosphatemia   Hyponatremia   Acute acalculous cholecystitis  Assessment and Plan: Acute acalculous cholecystitis. Severe sepsis. Patient developed significant fever and tachycardia, CT abdomen pelvis showed acute acalculous cholecystitis 11/30.  Patient was started on Zosyn. General surgery has seen the patient,  currently will be treated with antibiotics. Patient was hypotensive 12/1, received a liter of fluid bolus.  Lactic acid was normal.  Blood pressure has been stabilized since. Patient condition is much improved, leukocytosis much better. Tolerating tube feeds. Per speech consult, unlikely that she will recover swallowing ability so will need chronic enteral feeds if in line with GOC.  Palliative is following.    Traumatic subdural hematoma without loss of consciousness (HCC) Traumatic subarachnoid hemorrhage History of fall with head trauma 2 weeks prior to admission Bilateral burr hole procedure done on 11/25.  Decrease in the amount of blood on subdural hematomas and resolution of midline shift.  Patient with bilateral drains.  Patient has been seen by speech therapy, still not safe for any p.o. intake, given her altered mental status.  NG tube was placed on 11/27 after discussion with husband, tube feeding started.  Patient seem to be tolerating well.  Neurosurgery has removed the drain on 11/29.  Patient has been stable from neurosurgical standpoint. Staples removal on 12/0 Neurosurgery signed off    Acute metabolic encephalopathy VAD (vascular dementia) (HCC) History of stroke. Failure to thrive. Patient has severe dementia at baseline, she is nonverbal.  She has a worsening mental status since admission.  Her dementia probably will be getting worse after brain bleed.  Long-term prognosis very poor, palliative care consult obtained.   Hypokalemia. Hypophosphatemia. Hyponatremia. Conditions are resolved, continue monitoring.    Hypoglycemia secondary to poor p.o. intake. Start tube feeding.   Essential hypertension Discontinued losartan due to low blood pressure.   Hypothyroidism Continue levothyroxine     Subjective:  Patient is nonverbal, currently she has no complaint.  Physical  Exam: Vitals:   12/15/22 1833 12/15/22 2032 12/16/22 0423 12/16/22 0437  BP: 121/61 138/84   112/78  Pulse: 67 77  79  Resp:  17  17  Temp: 99 F (37.2 C) 98.5 F (36.9 C)  97.8 F (36.6 C)  TempSrc: Axillary     SpO2: 100% 100%  100%  Weight:   61.3 kg   Height:       General exam: Appears calm and comfortable  Respiratory system: Clear to auscultation. Respiratory effort normal. Cardiovascular system: S1 & S2 heard, RRR. No JVD, murmurs, rubs, gallops or clicks. No pedal edema. Gastrointestinal system: Abdomen is nondistended, soft and does not appear tender to palpation. No organomegaly or masses felt.  Central nervous system: Alert and nonverbal. Does not follow commands Extremities: trace edema Skin: No rashes, lesions or ulcers  Data Reviewed:  Lab results reviewed.  Family Communication: none at bedside.  Disposition: Status is: Inpatient Remains inpatient appropriate because: Severity of disease,     Time spent: 55 minutes  Author: Leeroy Bock, MD 12/16/2022 7:22 AM  For on call review [email or web address].

## 2022-12-16 NOTE — Plan of Care (Signed)

## 2022-12-17 DIAGNOSIS — S065XAA Traumatic subdural hemorrhage with loss of consciousness status unknown, initial encounter: Secondary | ICD-10-CM

## 2022-12-17 DIAGNOSIS — R5383 Other fatigue: Secondary | ICD-10-CM | POA: Diagnosis not present

## 2022-12-17 DIAGNOSIS — S065X0A Traumatic subdural hemorrhage without loss of consciousness, initial encounter: Secondary | ICD-10-CM | POA: Diagnosis not present

## 2022-12-17 DIAGNOSIS — K810 Acute cholecystitis: Secondary | ICD-10-CM | POA: Diagnosis not present

## 2022-12-17 DIAGNOSIS — Z7189 Other specified counseling: Secondary | ICD-10-CM

## 2022-12-17 DIAGNOSIS — K819 Cholecystitis, unspecified: Secondary | ICD-10-CM

## 2022-12-17 LAB — BASIC METABOLIC PANEL
Anion gap: 8 (ref 5–15)
BUN: 10 mg/dL (ref 8–23)
CO2: 26 mmol/L (ref 22–32)
Calcium: 8.2 mg/dL — ABNORMAL LOW (ref 8.9–10.3)
Chloride: 106 mmol/L (ref 98–111)
Creatinine, Ser: 0.57 mg/dL (ref 0.44–1.00)
GFR, Estimated: >60 mL/min (ref >60)
Glucose, Bld: 128 mg/dL — ABNORMAL HIGH (ref 70–99)
Potassium: 3.6 mmol/L (ref 3.5–5.1)
Sodium: 140 mmol/L (ref 135–145)

## 2022-12-17 LAB — GLUCOSE, CAPILLARY
Glucose-Capillary: 116 mg/dL — ABNORMAL HIGH (ref 70–99)
Glucose-Capillary: 120 mg/dL — ABNORMAL HIGH (ref 70–99)
Glucose-Capillary: 107 mg/dL — ABNORMAL HIGH (ref 70–99)
Glucose-Capillary: 122 mg/dL — ABNORMAL HIGH (ref 70–99)
Glucose-Capillary: 134 mg/dL — ABNORMAL HIGH (ref 70–99)

## 2022-12-17 LAB — MAGNESIUM: Magnesium: 1.9 mg/dL (ref 1.7–2.4)

## 2022-12-17 LAB — PHOSPHORUS: Phosphorus: 2.6 mg/dL (ref 2.5–4.6)

## 2022-12-17 MED ORDER — AMOXICILLIN-POT CLAVULANATE 400-57 MG/5ML PO SUSR
800.0000 mg | Freq: Two times a day (BID) | ORAL | Status: AC
  Administered 2022-12-18 – 2022-12-22 (×10): 800 mg
  Filled 2022-12-17 (×10): qty 10

## 2022-12-17 NOTE — Plan of Care (Addendum)
PMT following for goals of care.  Patient resting in bed.  In to see patient x 2 and husband is not present; advised he had left to run some errands. PMT will follow-up tomorrow.

## 2022-12-17 NOTE — Care Management Important Message (Signed)
Important Message  Patient Details  Name: Christine Hull MRN: [id number] Date of Birth: [birth year]   Important Message Given:  Yes - Medicare and [employer] IM  Patient's spouse, Jonny Ruiz was in the room so reviewed with him.   Olegario Messier A Zion Lint 12/17/2022, 10:20 AM

## 2022-12-17 NOTE — Evaluation (Signed)
Occupational Therapy Evaluation Patient Details Name: Christine Hull MRN: [id number] DOB: 05-26-1950 Today's Date: 12/17/2022   History of Present Illness Pt is a 72 year old female presenting to the ED after a fall, Bilateral burr hole procedure done by neurosurgery on 11/25; pt also developed acalculous cholecystitis   PMH significant for hypothyroidism, hypertension, prior stroke with vascular dementia   Clinical Impression   Chart reviewed, pt seen as a co tx with PT. Pt is alert, however essentially non verbal and does not follow commands consistently. Pt tracks throughout room approx 50% of the time. MAX-TOTAL A required for mobility, all ADLs at this time. BUE rigid during all attempts at PROM. Contacted team re: goals for therapy and will need to confirm PLOF. Pt will be placed on a brief trial of services to optimize ADL participation.       If plan is discharge home, recommend the following: Two people to help with walking and/or transfers;Two people to help with bathing/dressing/bathroom    Functional Status Assessment  Patient has had a recent decline in their functional status and/or demonstrates limited ability to make significant improvements in function in a reasonable and predictable amount of time  Equipment Recommendations  Other (comment) (defer)    Recommendations for Other Services       Precautions / Restrictions Precautions Precautions: Fall Precaution Comments: NG tube feeds Restrictions Weight Bearing Restrictions: No      Mobility Bed Mobility Overal bed mobility: Needs Assistance Bed Mobility: Rolling Rolling: Total assist, +2 for physical assistance         General bed mobility comments: pt does perform semi supine>long sit 1 attempt with CGA and remains in long sit for approx 1 minute unsuppoprted    Transfers                   General transfer comment: unsafe to attempt on this date      Balance       Sitting balance -  Comments: Fair blance noted with long sit in bed                                   ADL either performed or assessed with clinical judgement   ADL Overall ADL's : Needs assistance/impaired Eating/Feeding: NPO   Grooming: Wash/dry face;Total assistance       Lower Body Bathing: Maximal assistance;Total assistance;Bed level   Upper Body Dressing : Maximal assistance;Total assistance;Bed level   Lower Body Dressing: Maximal assistance;Total assistance;Bed level       Toileting- Clothing Manipulation and Hygiene: Maximal assistance;Total assistance;Bed level;+2 for physical assistance Toileting - Clothing Manipulation Details (indicate cue type and reason): after incontinent of BM/bladder in bed             Vision   Additional Comments: will continue to assess, inconsistent tracking in the room     Perception         Praxis         Pertinent Vitals/Pain Pain Assessment Pain Assessment: PAINAD Breathing: normal Negative Vocalization: none Facial Expression: smiling or inexpressive Body Language: tense, distressed pacing, fidgeting Consolability: no need to console PAINAD Score: 1     Extremity/Trunk Assessment Upper Extremity Assessment Upper Extremity Assessment: Difficult to assess due to impaired cognition (rigitidy noted throuhgout BUE with decreased tolerance for B shoulder flexion, elbows resting in extension, difficult to flex)   Lower Extremity Assessment Lower Extremity Assessment:  Difficult to assess due to impaired cognition       Communication Communication Communication: Difficulty following commands/understanding;Difficulty communicating thoughts/reduced clarity of speech Cueing Techniques: Verbal cues;Gestural cues;Tactile cues;Visual cues   Cognition Arousal: Alert Behavior During Therapy: Flat affect Overall Cognitive Status: No family/caregiver present to determine baseline cognitive functioning Area of Impairment:  Orientation, Attention, Memory, Following commands, Safety/judgement, Awareness                               General Comments: Pt is non verbal, tracks inconsistently throughout the room(approx 50% of the time), visual focus on therapist moving in room approx 50% oof the time     General Comments  vss throughout    Exercises     Shoulder Instructions      Home Living Family/patient expects to be discharged to:: Private residence Living Arrangements: Spouse/significant other                               Additional Comments: pt is a poor historian, non verbal; will need to confirm;      Prior Functioning/Environment Prior Level of Function : Patient poor historian/Family not available             Mobility Comments: will need to confirm ADLs Comments: per chart, pt required assist for all ADL/IADL, but prior to admission was having increased difficulties feeding herself so she was participating in some ADLs        OT Problem List: Decreased activity tolerance;Decreased strength;Impaired balance (sitting and/or standing);Decreased cognition;Decreased knowledge of use of DME or AE      OT Treatment/Interventions: Self-care/ADL training;Therapeutic exercise;Patient/family education;Balance training;Therapeutic activities;DME and/or AE instruction    OT Goals(Current goals can be found in the care plan section) Acute Rehab OT Goals OT Goal Formulation: Patient unable to participate in goal setting  OT Frequency: Min 1X/week    Co-evaluation PT/OT/SLP Co-Evaluation/Treatment: Yes Reason for Co-Treatment: For patient/therapist safety;To address functional/ADL transfers;Necessary to address cognition/behavior during functional activity;Complexity of the patient's impairments (multi-system involvement)   OT goals addressed during session: ADL's and self-care      AM-PAC OT "6 Clicks" Daily Activity     Outcome Measure Help from another person  eating meals?: Total Help from another person taking care of personal grooming?: Total Help from another person toileting, which includes using toliet, bedpan, or urinal?: Total Help from another person bathing (including washing, rinsing, drying)?: Total Help from another person to put on and taking off regular upper body clothing?: Total Help from another person to put on and taking off regular lower body clothing?: Total 6 Click Score: 6   End of Session Nurse Communication: Mobility status (?re: goals of therapy)  Activity Tolerance: Other (comment) (limited by current cognitive status) Patient left: in bed;with call bell/phone within reach;with nursing/sitter in room;with bed alarm set  OT Visit Diagnosis: Other abnormalities of gait and mobility (R26.89)                Time: 8469-6295 OT Time Calculation (min): 15 min Charges:  OT General Charges $OT Visit: 1 Visit OT Evaluation $OT Eval Moderate Complexity: 1 Mod  Oleta Mouse, OTD OTR/L  12/17/22, 1:09 PM

## 2022-12-17 NOTE — Progress Notes (Signed)
[facility] SURGICAL PROGRESS NOTE (cpt [phone])  Hospital Day(s): 13.   Interval History: Patient seen and examined, husband is at bedside today. He reports that she seemed to have some pain last night but improved with pain medications. She is tolerating tube feeds.   Review of Systems:  Unable to reliably perform secondary to non-verbal status   Vital signs in last 24 hours: [min-max] current  Temp:  [98.3 F (36.8 C)-99.2 F (37.3 C)] 98.3 F (36.8 C) (12/05 0340) Pulse Rate:  [72-86] 81 (12/05 0340) Resp:  [18] 18 (12/05 0340) BP: (101-134)/(70-87) 118/70 (12/05 0340) SpO2:  [98 %-100 %] 99 % (12/05 0340) Weight:  [58.6 kg] 58.6 kg (12/05 0343)     Height: 5\' 5"  (165.1 cm) Weight: 58.6 kg BMI (Calculated): 21.5   Intake/Output last 2 shifts:  12/04 0701 - 12/05 0700 In: 3208.7 [NG/GT:2557.3; IV Piggyback:651.4] Out: 200 [Urine:200]   Physical Exam:  Constitutional: alert, mumbles some during exam but can track  HENT NGT in place; tube feedings running at 65 ml/hr Eyes: PERRL, EOM's grossly intact and symmetric  Respiratory: breathing non-labored at rest  Cardiovascular: regular rate and sinus rhythm  Gastrointestinal: soft, she barely winces during palpation in RUQ but also winces some with palpation throughout abdomen.    Labs:     Latest Ref Rng & Units 12/16/2022    5:03 AM 12/15/2022    8:02 AM 12/14/2022    3:03 AM  CBC  WBC 4.0 - 10.5 K/uL 10.7  12.5  21.7   Hemoglobin 12.0 - 15.0 g/dL 8.9  9.7  8.5   Hematocrit 36.0 - 46.0 % 26.5  29.1  25.8   Platelets 150 - 400 K/uL 447  381  356       Latest Ref Rng & Units 12/17/2022    4:31 AM 12/16/2022    5:03 AM 12/14/2022    3:03 AM  CMP  Glucose 70 - 99 mg/dL 604  540  981   BUN 8 - 23 mg/dL 10  8  11    Creatinine 0.44 - 1.00 mg/dL 1.91  4.78  2.95   Sodium 135 - 145 mmol/L 140  139  137   Potassium 3.5 - 5.1 mmol/L 3.6  3.1  3.7   Chloride 98 - 111 mmol/L 106  106  108   CO2 22 - 32 mmol/L 26   25  22    Calcium 8.9 - 10.3 mg/dL 8.2  8.2  7.9      Imaging studies: No new pertinent imaging studies   Assessment/Plan: (ICD-10's: K81.0) 72 y.o. female with acalculous cholecystitis complicated by history of bilateral subdural hematomas s/p burr hole 11/25, non-verbal status    - She is improving, tolerating tube feeds and her exam [place] much better.   - Continue IV Abx (Zosyn); would complete a total of 10 day course   - Monitor abdominal examination  - Further management per primary service   - Discharge Planning; Surgery to sign off for now, please call us if there are any questions.   All of the above findings and recommendations were discussed with the medical team. No family present    -- Baker Pierini, M.D. [facility]

## 2022-12-17 NOTE — Progress Notes (Signed)
Late entry from 12/16/22 around 1620. Mr. Heiney, the pt's husband came to the [facility] office. He was upset his wife was still in a semiprivate room. He stated he had talked with Arleta Creek in pt experience and Verl Bangs 1C CN on 12/3 and was told he would have his wife moved into a private room. When he arrived yesterday to visit her, he found she was still in a semiprivate room with a roommate. He said he was told the other bed would be blocked. He believed the treatment was "unfair" as he noticed private rooms around his wife's room had people placed after he was told she would be moved to a private. He asked if semi private rooms were charged at the same fee as the private rooms partly as he found the appointments of the semiprivate rooms not consistent with the private rooms. There were not as many recliners, or places for visitors to sit in the semiprivate rooms and they had a shared restroom. He used the word fair several times. I apologized for a situation that he felt was unfair. I explained the same amount of furniture could not go in the semi private rooms as the private rooms as having a large amount of furniture in a semiprivate room could post a safety issue if there were an emergency on either side of the room. I explained that I did not know what the fees were for private vs. Semi privates. Mr. Scites discussed his concerns for about 20 minutes. While he was in the office, I called the unit charge RN and requested she be moved to a private room when one became available. I gave him the [facility] card with the direct number and asked him to call anytime he felt needs/expectations  were not being met. I heard from the unit she had been placed to private room around 1700, I do not know when she was transferred into the room. I spoke with Arleta Creek in patient experience. Mr. Doakes was concerned during our conversation but was polite and clear about his expectations.

## 2022-12-17 NOTE — Evaluation (Signed)
Physical Therapy Evaluation Patient Details Name: Christine Hull MRN: [id number] DOB: 05/24/1950 Today's Date: 12/17/2022  History of Present Illness  Christine Hull is a 72 y.o. female with medical history significant for Hypothyroidism, hypertension, prior stroke with vascular dementia, brought into the ED due to concerns for a 2-week history of shuffling gait, increased somnolence and decreased ability to participate in ADLs like feeding herself.  Symptoms started following an unwitnessed fall with head strike 2 weeks prior.  History is provided by husband at bedside.  On the day after her fall, patient was noted to have a skin tear on her occipital scalp with a little bleeding.  Over the course of the next 2 weeks, she started declining.  They went to the PCP who directed them to the ED for evaluation. Patient now s/p bilateral burr holes for evacuation of bilateral subdural hematomas with mass effect performed 12/07/22.   Clinical Impression  Patient received in bed, alert. Not responsive to commands or questions. Patient is unable to follow direction at all to participate in PT. Required total +2 assist to roll in bed. She does not demonstrate ability to benefit from skilled PT at this time. Signing off.          If plan is discharge home, recommend the following: Two people to help with bathing/dressing/bathroom   Can travel by private vehicle   No    Equipment Recommendations None recommended by PT  Recommendations for Other Services       Functional Status Assessment Patient has had a recent decline in their functional status and/or demonstrates limited ability to make significant improvements in function in a reasonable and predictable amount of time     Precautions / Restrictions Precautions Precautions: Fall Precaution Comments: NG tube feeds Restrictions Weight Bearing Restrictions: No      Mobility  Bed Mobility Overal bed mobility: Needs Assistance Bed  Mobility: Rolling Rolling: Total assist, +2 for physical assistance              Transfers                        Ambulation/Gait                  Stairs            Wheelchair Mobility     Tilt Bed    Modified Rankin (Stroke Patients Only)       Balance                                             Pertinent Vitals/Pain Pain Assessment Pain Assessment: PAINAD Breathing: normal Negative Vocalization: none Facial Expression: smiling or inexpressive Body Language: tense, distressed pacing, fidgeting Consolability: no need to console PAINAD Score: 1    Home Living Family/patient expects to be discharged to:: Skilled nursing facility Living Arrangements: Spouse/significant other                      Prior Function               Mobility Comments: unsure, spouse not present in room       Extremity/Trunk Assessment   Upper Extremity Assessment Upper Extremity Assessment: Defer to OT evaluation    Lower Extremity Assessment Lower Extremity Assessment: Difficult to assess due to impaired cognition  Communication   Communication Communication: Difficulty communicating thoughts/reduced clarity of speech  Cognition Arousal: Alert Behavior During Therapy:  (not responsive) Overall Cognitive Status: No family/caregiver present to determine baseline cognitive functioning                                          General Comments      Exercises     Assessment/Plan    PT Assessment Patient does not need any further PT services  PT Problem List         PT Treatment Interventions      PT Goals (Current goals can be found in the Care Plan section)  Acute Rehab PT Goals PT Goal Formulation: Patient unable to participate in goal setting    Frequency       Co-evaluation PT/OT/SLP Co-Evaluation/Treatment: Yes Reason for Co-Treatment: For patient/therapist safety;To address  functional/ADL transfers;Necessary to address cognition/behavior during functional activity;Complexity of the patient's impairments (multi-system involvement)           AM-PAC PT "6 Clicks" Mobility  Outcome Measure Help needed turning from your back to your side while in a flat bed without using bedrails?: Total Help needed moving from lying on your back to sitting on the side of a flat bed without using bedrails?: Total Help needed moving to and from a bed to a chair (including a wheelchair)?: Total Help needed standing up from a chair using your arms (e.g., wheelchair or bedside chair)?: Total Help needed to walk in hospital room?: Total Help needed climbing 3-5 steps with a railing? : Total 6 Click Score: 6    End of Session   Activity Tolerance: Other (comment) (patient not able to participate) Patient left: in bed;with bed alarm set Nurse Communication: Mobility status      Time: 1610-9604 PT Time Calculation (min) (ACUTE ONLY): 15 min   Charges:   PT Evaluation $PT Eval Moderate Complexity: 1 Mod   PT General Charges $$ ACUTE PT VISIT: 1 Visit         Paizley Ramella, PT, GCS 12/17/22,11:50 AM

## 2022-12-17 NOTE — Progress Notes (Signed)
Progress Note Patient: Christine Hull MWN:[id number] DOB: 08-27-1950 DOA: 12/04/2022     13 DOS: the patient was seen and examined on 12/17/2022   Brief hospital course: 72 year old female with past medical history of hypothyroidism, hypertension, prior stroke with advanced vascular dementia brought into the ED after decreased alertness and activity level. She had a fall around 2 weeks prior to arrival. In the ER found to have bilateral subdural hematomas with resultant 4 mm midline shift to the left and possible subarachnoid hemorrhage.  Bilateral burr hole procedure done by neurosurgery on 11/25.  11/26: repeat head CT- Decrease in size of bilateral subdural collections 5 mm on the right (was 1.5 cm previously), 6 mm on the left (was previously 1.1 cm).  Resolution of midline shift. 11/27.  Patient is evaluated by speech therapy, still not safe to for oral intake.  Place NG tube start tube feeding. 11/29.  Neurosurgery has removed wound drain. 11/30.  Patient developed a high fever, CT abdomen showed acalculous cholecystitis.  Started Zosyn for medical management. General surgery consulted 12/3.  Leukocytosis essentially resolved, restart tube feeding at two thirds of required rate. 12/4: minimally responsive. Does not appear to be in acute pain on exam. 12/5: discussed prognosis with husband at bedside and given that she has not shown advancement in about 4 weeks since initial injury and her baseline quality of life prior to the injury was mobile but completely dependent for ADLs and minimal verbal, her prognosis of further improvement is very unlikely and I shared this with her husband. He expressed surprise at this information and stated that no one has ever told him that she might not get better. We further discussed GOC and he wanted to talk with his daughter and palliative again about the options. I discussed this with palliative who says that multiple team members have discussed this with him  already. I recommend further GOC conversations take place with both the husband and daughter. Encouraging family to consider comfort measures.   Principal Problem:   Traumatic subdural hematoma without loss of consciousness (HCC) Active Problems:   Traumatic subarachnoid hemorrhage (HCC)   Acute metabolic encephalopathy   Hypothyroidism   VAD (vascular dementia) (HCC)   Hypokalemia   Essential hypertension   Sinus bradycardia   History of fall   History of CVA (cerebrovascular accident)   Protein-calorie malnutrition, severe   Hypophosphatemia   Hyponatremia   Acute acalculous cholecystitis  Assessment and Plan: Acute acalculous cholecystitis. Severe sepsis. Patient developed significant fever and tachycardia, hypotension 12/1. CT abdomen pelvis showed acute acalculous cholecystitis 11/30.   General surgery consulted and did not recommend surgical intervention. Patient was started on Zosyn. Continues to remain stable at this point.  - transition to augmentin for total 10 day Abx course.   Traumatic subdural hematoma  Traumatic subarachnoid hemorrhage History of fall with head trauma 2 weeks POA Imaging showed bilateral subdural hematomas with resultant 4 mm midline shift to the left and possible subarachnoid hemorrhage Neurosurgery consulted and performed bilateral burr hole procedure 11/25.  Repeat head imaging post-procedure 11/26, 12/1 showed stable condition. Bilateral drains removed 11/29.  Her mental status appears to be stable and not improving significantly since admission and about 4 weeks from initial injury. I predict that further significant improvement is not likely at this time.  - analgesia PRN - repeat head imaging if focal changes occur - palliative consulted, appreciate your care - staples to be removed 12/9 - neurosurgery signed off - continue prophylactic keppra  for another couple days  Dysphagia- severe. Evaluated several times by speech therapy and she is  unable to take anything safely by mouth and unlikely to recover this function.  - continue NG tube. GOC discussions with family ongoing of PEG vs comfort care. Once decision by family is made, will proceed with arrangements.     Acute metabolic encephalopathy VAD (vascular dementia) (HCC)  History of stroke. Failure to thrive. Patient has severe dementia at baseline, she is nonverbal.  She has a worsening mental status since admission.  Her dementia probably will be getting worse after brain bleed.  Long-term prognosis very poor, palliative care consult obtained. Husband states that he would not be able to care for her at him in this condition.  - TOC consulted - ongoing GOC conversations   Hypokalemia. Hypophosphatemia. Hyponatremia. Conditions are resolved, continue monitoring.    Hypoglycemia secondary to poor p.o. intake. - continue tube feeding.   Essential hypertension Discontinued losartan due to low blood pressure.   Hypothyroidism Continue levothyroxine     Subjective:  Patient is nonverbal, currently she has no complaint. Opens eyes intermittently and does not appear to be in discomfort  Physical Exam: Vitals:   12/16/22 1702 12/16/22 2028 12/17/22 0340 12/17/22 0343  BP: 134/87 101/77 118/70   Pulse: 86 79 81   Resp:  18 18   Temp: 99.2 F (37.3 C) 98.7 F (37.1 C) 98.3 F (36.8 C)   TempSrc:      SpO2: 100% 99% 99%   Weight:    58.6 kg  Height:       General exam: Appears calm and comfortable  Respiratory system: Clear to auscultation. Respiratory effort normal. Cardiovascular system: S1 & S2 heard, RRR. No JVD, murmurs, rubs, gallops or clicks. No pedal edema. Gastrointestinal system: Abdomen is nondistended, soft and does not appear tender to palpation. No organomegaly or masses felt.  Central nervous system: Alert and nonverbal. Does not follow commands Extremities: trace edema Skin: No rashes, lesions or ulcers. Staples on bilateral scalp  Data  Reviewed:  Lab results reviewed.  Family Communication: husband at bedside.  Disposition: Status is: Inpatient Remains inpatient appropriate because: Severity of disease, GOC, placement decision     Time spent: 55 minutes  Author: Leeroy Bock, MD 12/17/2022 7:15 AM  For on call review [email or web address].

## 2022-12-17 NOTE — Progress Notes (Signed)
Patient's husband is interested in having a meeting with all medical staff to determine POC.  This RN notified Dr Dareen Piano and social work. Dr Dareen Piano also notified of dried blood on patient's mouth and gown.  Patient clean with gown changed.  This RN  unable to visualize inside oral cavity due to poor ability to follow directions from patient.

## 2022-12-18 DIAGNOSIS — Z515 Encounter for palliative care: Secondary | ICD-10-CM | POA: Diagnosis not present

## 2022-12-18 DIAGNOSIS — S065XAA Traumatic subdural hemorrhage with loss of consciousness status unknown, initial encounter: Secondary | ICD-10-CM | POA: Diagnosis not present

## 2022-12-18 DIAGNOSIS — R5383 Other fatigue: Secondary | ICD-10-CM | POA: Diagnosis not present

## 2022-12-18 DIAGNOSIS — S065X0D Traumatic subdural hemorrhage without loss of consciousness, subsequent encounter: Secondary | ICD-10-CM | POA: Diagnosis not present

## 2022-12-18 DIAGNOSIS — F01C18 Vascular dementia, severe, with other behavioral disturbance: Secondary | ICD-10-CM | POA: Diagnosis not present

## 2022-12-18 DIAGNOSIS — E43 Unspecified severe protein-calorie malnutrition: Secondary | ICD-10-CM | POA: Diagnosis not present

## 2022-12-18 DIAGNOSIS — K819 Cholecystitis, unspecified: Secondary | ICD-10-CM | POA: Diagnosis not present

## 2022-12-18 DIAGNOSIS — S065X0A Traumatic subdural hemorrhage without loss of consciousness, initial encounter: Secondary | ICD-10-CM | POA: Diagnosis not present

## 2022-12-18 LAB — GLUCOSE, CAPILLARY
Glucose-Capillary: 117 mg/dL — ABNORMAL HIGH (ref 70–99)
Glucose-Capillary: 124 mg/dL — ABNORMAL HIGH (ref 70–99)
Glucose-Capillary: 124 mg/dL — ABNORMAL HIGH (ref 70–99)
Glucose-Capillary: 127 mg/dL — ABNORMAL HIGH (ref 70–99)
Glucose-Capillary: 134 mg/dL — ABNORMAL HIGH (ref 70–99)
Glucose-Capillary: 142 mg/dL — ABNORMAL HIGH (ref 70–99)
Glucose-Capillary: 145 mg/dL — ABNORMAL HIGH (ref 70–99)

## 2022-12-18 NOTE — Progress Notes (Addendum)
[facility] Room 102- [facility] Liaison Note:  This patient is currently enrolled in the GUIDE/home based primary care program with [facility]. [facility] will continue to follow for discharge disposition.      Please don't hesitate to call with any questions or concerns.    Thank you for the opportunity to participate in this patient's care.  [facility] Liaison [phone]

## 2022-12-18 NOTE — Plan of Care (Signed)
  Problem: Cardiac: Goal: Will achieve and/or maintain adequate cardiac output Outcome: Progressing   Problem: Physical Regulation: Goal: Complications related to the disease process, condition or treatment will be avoided or minimized Outcome: Progressing   Problem: Health Behavior/Discharge Planning: Goal: Ability to manage health-related needs will improve Outcome: Progressing   Problem: Clinical Measurements: Goal: Ability to maintain clinical measurements within normal limits will improve Outcome: Progressing Goal: Will remain free from infection Outcome: Progressing   Problem: Activity: Goal: Risk for activity intolerance will decrease Outcome: Progressing   Problem: Nutrition: Goal: Adequate nutrition will be maintained Outcome: Progressing

## 2022-12-18 NOTE — TOC Progression Note (Signed)
Transition of Care [facility]) - Progression Note    Patient Details  Name: Christine Hull MRN: [id number] Date of Birth: 02-27-1950  Transition of [facility]) CM/SW Contact  Allena Katz, LCSW Phone Number: 12/18/2022, 8:34 AM  Clinical Narrative:   CSW spoke with patients spouse to talk about discharge plans. Spouse did not want to do this until he had a chance to have a family meeting with the MD, Neuro and palliative. Message sent to MD to coordinate.     Expected Discharge Plan:  (TBD) Barriers to Discharge: Continued Medical Work up  Expected Discharge Plan and Services       Living arrangements for the past 2 months: Single Family Home                                       Social Determinants of Health (SDOH) Interventions SDOH Screenings   Food Insecurity: Patient Unable To Answer (12/06/2022)  Housing: Patient Unable To Answer (12/06/2022)  Transportation Needs: Patient Unable To Answer (12/06/2022)  Utilities: Patient Unable To Answer (12/06/2022)  Tobacco Use: High Risk (12/07/2022)    Readmission Risk Interventions     No data to display

## 2022-12-18 NOTE — Progress Notes (Signed)
Daily Progress Note   Patient Name: Christine Hull       Date: 12/18/2022 DOB: [birth year]  Age: 72 y.o. MRN#: [id number] Attending Physician: Leeroy Bock, MD Primary Care Physician: Annita Brod, MD Admit Date: 12/04/2022  Reason for Consultation/Follow-up: Establishing goals of care  HPI/Brief Hospital Review: 72 y.o. female  with past medical history of hypothyroidism, hypertension, and prior stroke with vascular dementia admitted on 12/04/2022 with AMS and decreased function. Found to have bilateral subdural hematomas with resultant 4 mm midline shift to the left and possible subarachnoid hemorrhage. Bilateral burr hole procedure done by neurosurgery on 11/25.   11/26. Decrease in size of bilateral subdural collections 5 mm on the right (was 1.5 cm previously), 6 mm on the left (was previously 1.1 cm).  Resolution of midline shift. 11/27.  Patient is evaluated by speech therapy, still not safe to for oral intake.  Place NG tube start tube feeding. PMT consulted to discuss GOC.   11/30 CT obtained revealing cholecystitis-managed with antibiotic therapy  Remains minimally responsive, NGT with tube feedings remain in place  Palliative Medicine consulted for assisting with goals of care conversations.  Subjective: Extensive chart review has been completed prior to meeting patient including labs, vital signs, imaging, progress notes, orders, and available advanced directive documents from current and previous encounters.    Met collaboratively as requested by Dr. Dareen Piano with husband-Christine Hull and daughter-Christine Hull at bedside.  Dr. Dareen Piano reviewed prognosis and chance of further improvement being unlikely given no signs of advancement since initial injury and with poor functional  status at baseline.  As requested by family, we discussed the difference between continuing on current plan of care versus transitioning our focus to comfort based care. We also discussed the overall philosophy of hospice, services offered by hospice and ability of hospice to provide care within the home, LTC versus IPU-we discussed general eligibility criteria for IPU.  Collaboratively, answered and addressed questions and concerns from family regarding EOL expectations, PEG tube placement and risks associated, quality of life and overall prognosis. Requested family reflect on [name]'s life, quality of life expectations she would have and desires she would have at EOL.  At this time, husband and daughter request time to discuss options amongst themselves and other family members. Request palliative follow-up again tomorrow at bedside.  Thank you for allowing the Palliative Medicine Team to assist in the care of this patient.  Total time:  35 minutes  Time spent includes: Detailed review of medical records (labs, imaging, vital signs), medically appropriate exam (mental status, respiratory, cardiac, skin), discussed with treatment team, counseling and educating patient, family and staff, documenting clinical information, medication management and coordination of care.  Leeanne Deed, DNP, AGNP-C Palliative Medicine   Please contact Palliative Medicine Team phone at [phone] for questions and concerns.

## 2022-12-18 NOTE — Progress Notes (Signed)
Progress Note Patient: Christine Hull BMW:[id number] DOB: 02-03-1950 DOA: 12/04/2022     14 DOS: the patient was seen and examined on 12/18/2022   Brief hospital course: 72 year old female with past medical history of hypothyroidism, hypertension, prior stroke with advanced vascular dementia brought into the ED after decreased alertness and activity level. She had a fall around 2 weeks prior to arrival. In the ER found to have bilateral subdural hematomas with resultant 4 mm midline shift to the left and possible subarachnoid hemorrhage.  Bilateral burr hole procedure done by neurosurgery on 11/25.  11/26: repeat head CT- Decrease in size of bilateral subdural collections 5 mm on the right (was 1.5 cm previously), 6 mm on the left (was previously 1.1 cm).  Resolution of midline shift. 11/27.  Patient is evaluated by speech therapy, still not safe to for oral intake.  Place NG tube start tube feeding. 11/29.  Neurosurgery has removed wound drain. 11/30.  Patient developed a high fever, CT abdomen showed acalculous cholecystitis.  Started Zosyn for medical management. General surgery consulted 12/3.  Leukocytosis essentially resolved, restart tube feeding at two thirds of required rate. 12/4: minimally responsive. Does not appear to be in acute pain on exam. 12/5: discussed prognosis with husband at bedside and given that she has not shown advancement in about 4 weeks since initial injury and her baseline quality of life prior to the injury was mobile but completely dependent for ADLs and minimal verbal, her prognosis of further improvement is very unlikely and I shared this with her husband. He expressed surprise at this information and stated that no one has ever told him that she might not get better. We further discussed GOC and he wanted to talk with his daughter and palliative again about the options. I discussed this with palliative who says that multiple team members have discussed this with him  already. I recommend further GOC conversations take place with both the husband and daughter. Encouraging family to consider comfort measures.  12/6: no changes clinically. Planning family meeting today at 3:30 with husband and daughter and hopefully palliative to define GOC.   Principal Problem:   Traumatic subdural hematoma without loss of consciousness (HCC) Active Problems:   Traumatic subarachnoid hemorrhage (HCC)   Acute metabolic encephalopathy   Hypothyroidism   VAD (vascular dementia) (HCC)   Hypokalemia   Essential hypertension   Sinus bradycardia   History of fall   History of CVA (cerebrovascular accident)   Protein-calorie malnutrition, severe   Hypophosphatemia   Hyponatremia   Acute acalculous cholecystitis   Lethargy   Goals of care, counseling/discussion   Acalculous cholecystitis   SDH (subdural hematoma) (HCC)  Assessment and Plan: Acute acalculous cholecystitis. Severe sepsis. Patient developed significant fever and tachycardia, hypotension 12/1. CT abdomen pelvis showed acute acalculous cholecystitis 11/30.   General surgery consulted and did not recommend surgical intervention. Patient was started on Zosyn. Continues to remain stable at this point.  - transition to augmentin for total 10 day Abx course.   Traumatic subdural hematoma  Traumatic subarachnoid hemorrhage History of fall with head trauma 2 weeks POA Imaging showed bilateral subdural hematomas with resultant 4 mm midline shift to the left and possible subarachnoid hemorrhage Neurosurgery consulted and performed bilateral burr hole procedure 11/25.  Repeat head imaging post-procedure 11/26, 12/1 showed stable condition. Bilateral drains removed 11/29.  Her mental status appears to be stable and not improving significantly since admission and about 4 weeks from initial injury. I predict that further  significant improvement is not likely at this time. She was previously non-verbal at baseline. -  analgesia PRN - repeat head imaging if focal changes occur - palliative consulted, appreciate your care - staples to be removed 12/9 - neurosurgery signed off - continue prophylactic keppra for another couple days  Dysphagia- severe. Evaluated several times by speech therapy and she is unable to take anything safely by mouth and unlikely to recover this function.  - continue NG tube. GOC discussions with family ongoing of PEG vs comfort care. Once decision by family is made, will proceed with arrangements.     Acute metabolic encephalopathy VAD (vascular dementia) (HCC)  History of stroke. Failure to thrive. Patient has severe dementia at baseline, she is nonverbal.  She has a worsening mental status since admission and appears to have stabilized at new baseline. Long-term prognosis very poor, palliative care consult obtained. Husband states that he would not be able to care for her at home in this condition. - TOC consulted - ongoing GOC conversations   Hypokalemia. Hypophosphatemia. Hyponatremia. Conditions are resolved, continue monitoring.    Hypoglycemia secondary to poor p.o. intake. - continue tube feeding.   Essential hypertension Discontinued losartan due to low blood pressure.   Hypothyroidism Continue levothyroxine    Subjective:  Patient is nonverbal, currently she has no complaint. Opens eyes intermittently and does not appear to be in discomfort  Physical Exam: Vitals:   12/17/22 2030 12/18/22 0029 12/18/22 0349 12/18/22 0608  BP: 125/88 111/70 113/84   Pulse: 88 89 93   Resp: 18  20   Temp: 98.5 F (36.9 C)  98.8 F (37.1 C)   TempSrc: Axillary  Axillary   SpO2: 99% 100% 98%   Weight:    58.3 kg  Height:       General exam: Appears calm and comfortable  Respiratory system: Respiratory effort normal. Cardiovascular system: peripheral perfusion intact, No pedal edema. Gastrointestinal system: Abdomen is nondistended, soft and does not appear tender  to palpation. No organomegaly or masses felt.  Central nervous system: does not respond to verbal stimuli and nonverbal. Does not follow commands Extremities: trace edema Skin: No rashes, lesions or ulcers. Staples on bilateral scalp  Data Reviewed:  Lab results reviewed.  Family Communication: husband at bedside.  Disposition: Status is: Inpatient Remains inpatient appropriate because: Severity of disease, GOC, placement decision     Time spent: 55 minutes  Author: Leeroy Bock, MD 12/18/2022 7:13 AM  For on call review [email or web address].

## 2022-12-18 NOTE — Progress Notes (Signed)
Nutrition Follow-up  DOCUMENTATION CODES:   Severe malnutrition in context of social or environmental circumstances  INTERVENTION:   -TF via NGT:   Osmolite 1.2 @ 65 ml/hr    75 ml free water flush every 4 hours   Tube feeding regimen provides 1872 kcal (100% of needs), 87 grams of protein, and 1279 ml of H2O.  Total free water: 1729 ml daily  NUTRITION DIAGNOSIS:   Severe Malnutrition related to social / environmental circumstances as evidenced by severe fat depletion, severe muscle depletion.  Ongoing  GOAL:   Patient will meet greater than or equal to 90% of their needs  Met with TF  MONITOR:   Diet advancement, Labs, Weight trends, TF tolerance, Skin, I & O's  REASON FOR ASSESSMENT:   Consult Enteral/tube feeding initiation and management  ASSESSMENT:   72 y/o female with h/o hypothyroidism, HTN, HLD, CVA, mood disorder and dementia who is admitted with chronic bilateral subdural hematoma now s/p right & left sided burr holes for drainage of subdural hematoma 11/25.  11/27- s/p BSE- NPO, NGT placed, TF initiated 11/29- subdural drains removed by neurosurgery 11/30- s/p BSE- recommend continued NPO, pt developed fever- CT abdomen showed acalculous cholecystitis- zosyn started   Reviewed I/O's: +93 ml x 24 hours and +6.3 L since admission  Case discussed with RN, who reports pt tolerating TF well.    Spoke with pt husband at bedside. He reports that he thinks pt is a little more alert today. He has multiple questions regarding PEG placement, but is waiting updates from medical team, including Palliative Care, prior to making a decision. He reports he is struggling, as he was told that this is her new baseline, but was also told per neurosurgery that brain healing can take 4-6 weeks. His desire is to have pt to go rehab to get better. RD reviewed that pt is NPO and SLP has not medically cleared pt for a diet and will need enteral nutrition support if full scope care  is desired. RD explained that NGT is not permanent and cannot discharge the hospital with it. Per husband, pt is interested in pursuing PEG placement as he would like to rehab her. RD reports that if pt does improve and able to take PO's, pt could potentially eat and still use PEG for nutritional support. Emotional support provided. Pt husband expressed appreciation for update.   Medications reviewed and include keppra.   Labs reviewed: CBGS: 107-145 (inpatient orders for glycemic control are none).    Diet Order:   Diet Order             Diet NPO time specified  Diet effective now                   EDUCATION NEEDS:   No education needs have been identified at this time  Skin:  Skin Assessment: Skin Integrity Issues: Skin Integrity Issues:: Incisions Incisions: closed head  Last BM:  12/14/22 (type 6)  Height:   Ht Readings from Last 1 Encounters:  12/07/22 5\' 5"  (1.651 m)    Weight:   Wt Readings from Last 1 Encounters:  12/18/22 58.3 kg    Ideal Body Weight:  56.8 kg  BMI:  Body mass index is 21.39 kg/m.  Estimated Nutritional Needs:   Kcal:  1700-1900  Protein:  85-100 grams  Fluid:  > 1.7 L    Levada Schilling, RD, LDN, CDCES Registered Dietitian III Certified Diabetes Care and Education Specialist Please refer to  AMION for RD and/or RD on-call/weekend/after hours pager

## 2022-12-18 NOTE — Progress Notes (Signed)
Spoke with Christine Hull and his daughter Christine Hull for about 15 minutes. The primary concern they shared was that twice this week on Wednesday and Friday they had arrived to find her bed soiled and they did not believe that staff had checked on the patient frequently. I explained the expectation was that staff would be in there every hour during the day and at least every 2 hours at night. Spoke with Lupita Leash the Charge on the unit tonight and Victorino Dike the NT assigned  to the room to reinforce expectations.

## 2022-12-19 DIAGNOSIS — G9341 Metabolic encephalopathy: Secondary | ICD-10-CM | POA: Diagnosis not present

## 2022-12-19 DIAGNOSIS — Z515 Encounter for palliative care: Secondary | ICD-10-CM | POA: Diagnosis not present

## 2022-12-19 DIAGNOSIS — I10 Essential (primary) hypertension: Secondary | ICD-10-CM | POA: Diagnosis not present

## 2022-12-19 DIAGNOSIS — K810 Acute cholecystitis: Secondary | ICD-10-CM | POA: Diagnosis not present

## 2022-12-19 DIAGNOSIS — F01C18 Vascular dementia, severe, with other behavioral disturbance: Secondary | ICD-10-CM | POA: Diagnosis not present

## 2022-12-19 DIAGNOSIS — S065X0D Traumatic subdural hemorrhage without loss of consciousness, subsequent encounter: Secondary | ICD-10-CM | POA: Diagnosis not present

## 2022-12-19 DIAGNOSIS — E43 Unspecified severe protein-calorie malnutrition: Secondary | ICD-10-CM | POA: Diagnosis not present

## 2022-12-19 LAB — GLUCOSE, CAPILLARY
Glucose-Capillary: 126 mg/dL — ABNORMAL HIGH (ref 70–99)
Glucose-Capillary: 122 mg/dL — ABNORMAL HIGH (ref 70–99)
Glucose-Capillary: 126 mg/dL — ABNORMAL HIGH (ref 70–99)
Glucose-Capillary: 110 mg/dL — ABNORMAL HIGH (ref 70–99)
Glucose-Capillary: 114 mg/dL — ABNORMAL HIGH (ref 70–99)

## 2022-12-19 NOTE — Progress Notes (Signed)
Patient noted to have private duty aide at bedside now. She informed Jill Alexanders, NT that she would be providing the patient's care.

## 2022-12-19 NOTE — Plan of Care (Signed)
  Problem: Clinical Measurements: Goal: Ability to maintain clinical measurements within normal limits will improve Outcome: Progressing Goal: Will remain free from infection Outcome: Progressing Goal: Diagnostic test results will improve Outcome: Progressing Goal: Respiratory complications will improve Outcome: Progressing Goal: Cardiovascular complication will be avoided Outcome: Progressing   Problem: Elimination: Goal: Will not experience complications related to bowel motility Outcome: Progressing Goal: Will not experience complications related to urinary retention Outcome: Progressing   Problem: Pain Management: Goal: General experience of comfort will improve Outcome: Progressing   Problem: Safety: Goal: Ability to remain free from injury will improve Outcome: Progressing   Problem: Skin Integrity: Goal: Risk for impaired skin integrity will decrease Outcome: Progressing

## 2022-12-19 NOTE — Progress Notes (Signed)
[facility]  - [place] at [facility]   PATIENT NAME: Christine Hull    MR#:  [id number]  DATE OF BIRTH:  Aug 10, 1950  SUBJECTIVE:  patient nonverbal noncommunicative. Sitting upright. Eyes closed. NG feeding present. Patient's husband at bedside. Went into see patient with palliative care nurse practitioner Ladona Ridgel. Husband tells me no change in mentation. She remains about the same. No new issues per RN.    VITALS:  Blood pressure 111/71, pulse 87, temperature 98.9 F (37.2 C), temperature source Oral, resp. rate 20, height 5\' 5"  (1.651 m), weight 56.3 kg, SpO2 99%.  PHYSICAL EXAMINATION:  limited exam GENERAL:  72 y.o.-year-old patient with no acute distress. NG+ LUNGS: Normal breath sounds bilaterally CARDIOVASCULAR: S1, S2 normal.  ABDOMEN: Soft, nontender, nondistended. Bowel sounds present.  EXTREMITIES: No  edema b/l.    NEUROLOGIC: nonverbal noncommunicative SKIN: per RN  LABORATORY PANEL:  CBC Recent Labs  Lab 12/16/22 0503  WBC 10.7*  HGB 8.9*  HCT 26.5*  PLT 447*    Chemistries  Recent Labs  Lab 12/17/22 0431  NA 140  K 3.6  CL 106  CO2 26  GLUCOSE 128*  BUN 10  CREATININE 0.57  CALCIUM 8.2*  MG 1.9    Assessment and Plan  72 year old female with past medical history of hypothyroidism, hypertension, prior stroke with advanced vascular dementia brought into the ED after decreased alertness and activity level. She had a fall around 2 weeks prior to arrival. In the ER found to have bilateral subdural hematomas with resultant 4 mm midline shift to the left and possible subarachnoid hemorrhage.  Bilateral burr hole procedure done by neurosurgery on 11/25.   12/7: discussed with Mr. Altobelli in the room along with palliative care nurse practitioner Ladona Ridgel. Mr. Christos expressed family is leaning towards peg placement and will make final decision before end of today. He understands overall poor prognosis along with risk and complications  involved with peg feeding.   Acute acalculous cholecystitis. Severe sepsis. --Patient developed significant fever and tachycardia, hypotension 12/1. -- CT abdomen pelvis showed acute acalculous cholecystitis 11/30.   --General surgery consulted and did not recommend surgical intervention. Patient was started on Zosyn. Continues to remain stable at this point.  - transitioned to augmentin for total 10 day Abx course. -- Remains afebrile   Traumatic subdural hematoma  Traumatic subarachnoid hemorrhage History of fall with head trauma 2 weeks POA --Imaging showed bilateral subdural hematomas with resultant 4 mm midline shift to the left and possible subarachnoid hemorrhage --Neurosurgery consulted and performed bilateral burr hole procedure 11/25.  Repeat head imaging post-procedure 11/26, 12/1 showed stable condition. Bilateral drains removed 11/29.  Her mental status appears  not improving significantly since admission and about 4 weeks from initial injury.She was previously non-verbal at baseline. -- palliative consulted, appreciate your care -- staples to be removed 12/9 -- neurosurgery signed off -- continue prophylactic keppra for another couple days -- mentation remains the same. No improvement.   Dysphagia- severe.  --Evaluated several times by speech therapy and she is unable to take anything safely by mouth and unlikely to recover this function.  - continue NG tube. GOC discussions with family ongoing of PEG vs comfort care. In conversation with husband today looks like families intuits peg feeding --once final decision by family is made, will proceed with arrangements.     Acute metabolic encephalopathy VAD (vascular dementia) (HCC)  History of stroke. Failure to thrive. Patient has severe dementia at baseline, she is nonverbal.  She has a worsening mental status since admission and appears to have stabilized at new baseline. Long-term prognosis very poor,  Husband states that  he would not be able to care for her at home in this condition. - TOC consulted - ongoing GOC conversations   Hypokalemia. Hypophosphatemia. Hyponatremia. Conditions are resolved, continue monitoring.   Hypoglycemia secondary to poor p.o. intake. Nutrition Status: Nutrition Problem: Severe Malnutrition Etiology: social / environmental circumstances Signs/Symptoms: severe fat depletion, severe muscle depletion   - continue tube feeding.   Essential hypertension --Discontinued losartan due to low blood pressure.   Hypothyroidism --Continue levothyroxine      Family communication : husband at bedside Consults : neurosurgery CODE STATUS: full DVT Prophylaxis : Lovenox Level of care: Med-Surg Status is: Inpatient Remains inpatient appropriate because: complex medical issues. Awaiting family's decision for peg tube placement.    TOTAL TIME TAKING CARE OF THIS PATIENT: 35 minutes.  >50% time spent on counselling and coordination of care  Note: This dictation was prepared with Dragon dictation along with smaller phrase technology. Any transcriptional errors that result from this process are unintentional.  Enedina Finner M.D    [facility]   CC: Primary care physician; Annita Brod, MD

## 2022-12-19 NOTE — Progress Notes (Signed)
Patient's husband to nurses's station to let staff know he was leaving and be "back with his daughter around 4pm." He further requested that charge RN and bedside RN "make sure [his wife] was perfect" prior their arrival

## 2022-12-19 NOTE — Progress Notes (Signed)
Daily Progress Note   Patient Name: JAINA ROTZ       Date: 12/19/2022 DOB: 04-Nov-1950  Age: 72 y.o. MRN#: [id number] Attending Physician: Enedina Finner, MD Primary Care Physician: Annita Brod, MD Admit Date: 12/04/2022  Reason for Consultation/Follow-up: Establishing goals of care  HPI/Brief Hospital Review: 72 y.o. female  with past medical history of hypothyroidism, hypertension, and prior stroke with vascular dementia admitted on 12/04/2022 with AMS and decreased function. Found to have bilateral subdural hematomas with resultant 4 mm midline shift to the left and possible subarachnoid hemorrhage. Bilateral burr hole procedure done by neurosurgery on 11/25.   11/26. Decrease in size of bilateral subdural collections 5 mm on the right (was 1.5 cm previously), 6 mm on the left (was previously 1.1 cm).  Resolution of midline shift. 11/27.  Patient is evaluated by speech therapy, still not safe to for oral intake.  Place NG tube start tube feeding. PMT consulted to discuss GOC.    11/30 CT obtained revealing cholecystitis-managed with antibiotic therapy   Remains minimally responsive, NGT with tube feedings remain in place   Palliative Medicine consulted for assisting with goals of care conversations.  Subjective: Extensive chart review has been completed prior to meeting patient including labs, vital signs, imaging, progress notes, orders, and available advanced directive documents from current and previous encounters.    Met collaboratively with Dr. Allena Katz at bedside. Husband-John at bedside during time of visit. Ms. Diamond resting in bed with eyes closed, she does not acknowledge our presence in room.  Jonny Ruiz shares he is planning a family meeting for later this afternoon to discuss  goals of care for Ms. Mosco. Jonny Ruiz shares he and his daughter discussed goals after discussions had yesterday and at this time they are leaning towards pursuing PEG tube placement and would ultimately wish for Ms. Kamm to be at home with care aids.  John to reach out to nursing/primary team once final decision has been made after family discussion. PMT will follow along peripherally at this time. Encouraged family to reach out with any questions/concerns.  Care plan was discussed with primary team.  Thank you for allowing the Palliative Medicine Team to assist in the care of this patient.  Total time:  25 minutes  Time spent includes: Detailed review of medical records (labs, imaging, vital  signs), medically appropriate exam (mental status, respiratory, cardiac, skin), discussed with treatment team, counseling and educating patient, family and staff, documenting clinical information, medication management and coordination of care.  Leeanne Deed, DNP, AGNP-C Palliative Medicine   Please contact Palliative Medicine Team phone at [phone] for questions and concerns.

## 2022-12-19 NOTE — Progress Notes (Signed)
Patient checked for incont episode by [employer] and [place], NT. Patient noted to be clean and dry with skin intact. Peri care provided. Pt turned to right side and supported with pillows. HOB locked at 30 degrees or higher for TF safety/aspiration precautions.

## 2022-12-20 DIAGNOSIS — I10 Essential (primary) hypertension: Secondary | ICD-10-CM | POA: Diagnosis not present

## 2022-12-20 DIAGNOSIS — G9341 Metabolic encephalopathy: Secondary | ICD-10-CM | POA: Diagnosis not present

## 2022-12-20 DIAGNOSIS — S065X0D Traumatic subdural hemorrhage without loss of consciousness, subsequent encounter: Secondary | ICD-10-CM | POA: Diagnosis not present

## 2022-12-20 DIAGNOSIS — K810 Acute cholecystitis: Secondary | ICD-10-CM | POA: Diagnosis not present

## 2022-12-20 LAB — GLUCOSE, CAPILLARY
Glucose-Capillary: 110 mg/dL — ABNORMAL HIGH (ref 70–99)
Glucose-Capillary: 115 mg/dL — ABNORMAL HIGH (ref 70–99)
Glucose-Capillary: 117 mg/dL — ABNORMAL HIGH (ref 70–99)
Glucose-Capillary: 121 mg/dL — ABNORMAL HIGH (ref 70–99)
Glucose-Capillary: 124 mg/dL — ABNORMAL HIGH (ref 70–99)
Glucose-Capillary: 138 mg/dL — ABNORMAL HIGH (ref 70–99)

## 2022-12-20 NOTE — Plan of Care (Signed)
  Problem: Clinical Measurements: Goal: Will remain free from infection Outcome: Progressing   Problem: Activity: Goal: Risk for activity intolerance will decrease Outcome: Progressing   Problem: Nutrition: Goal: Adequate nutrition will be maintained Outcome: Progressing   Problem: Pain Management: Goal: General experience of comfort will improve Outcome: Progressing

## 2022-12-20 NOTE — Progress Notes (Signed)
[facility]  - [place] at [facility]   PATIENT NAME: Christine Hull    MR#:  [id number]  DATE OF BIRTH:  04/02/1950  SUBJECTIVE:  patient nonverbal noncommunicative. Sitting upright. Eyes closed. NG feeding present.  Patient's husband at bedside. She remains about the same. No new issues per RN.    VITALS:  Blood pressure 111/80, pulse 94, temperature 99.5 F (37.5 C), temperature source Axillary, resp. rate 18, height 5\' 5"  (1.651 m), weight 56.3 kg, SpO2 100%.  PHYSICAL EXAMINATION:  limited exam GENERAL:  72 y.o.-year-old patient with no acute distress. NG+ LUNGS: Normal breath sounds bilaterally CARDIOVASCULAR: S1, S2 normal.  ABDOMEN: Soft, nontender, nondistended. Bowel sounds present.  EXTREMITIES: No  edema b/l.    NEUROLOGIC: nonverbal noncommunicative SKIN: per RN  LABORATORY PANEL:  CBC Recent Labs  Lab 12/16/22 0503  WBC 10.7*  HGB 8.9*  HCT 26.5*  PLT 447*    Chemistries  Recent Labs  Lab 12/17/22 0431  NA 140  K 3.6  CL 106  CO2 26  GLUCOSE 128*  BUN 10  CREATININE 0.57  CALCIUM 8.2*  MG 1.9    Assessment and Plan  72 year old female with past medical history of hypothyroidism, hypertension, prior stroke with advanced vascular dementia brought into the ED after decreased alertness and activity level. She had a fall around 2 weeks prior to arrival. In the ER found to have bilateral subdural hematomas with resultant 4 mm midline shift to the left and possible subarachnoid hemorrhage.  Bilateral burr hole procedure done by neurosurgery on 11/25.   12/7: discussed with Mr. Blood in the room along with palliative care nurse practitioner Ladona Ridgel. Mr. Waye expressed family is leaning towards peg placement and will make final decision before end of today. He understands overall poor prognosis along with risk and complications involved with peg feeding.   Acute acalculous cholecystitis. Severe sepsis. --Patient developed  significant fever and tachycardia, hypotension 12/1. -- CT abdomen pelvis showed acute acalculous cholecystitis 11/30.   --General surgery consulted and did not recommend surgical intervention. Patient was started on Zosyn. Continues to remain stable at this point.  - transitioned to augmentin for total 10 day Abx course. -- Remains afebrile   Traumatic subdural hematoma  Traumatic subarachnoid hemorrhage History of fall with head trauma 2 weeks POA --Imaging showed bilateral subdural hematomas with resultant 4 mm midline shift to the left and possible subarachnoid hemorrhage --Neurosurgery consulted and performed bilateral burr hole procedure 11/25.  Repeat head imaging post-procedure 11/26, 12/1 showed stable condition. Bilateral drains removed 11/29.  Her mental status appears  not improving significantly since admission and about 4 weeks from initial injury.She was previously non-verbal at baseline. -- palliative consulted, appreciate your care -- staples to be removed 12/9 -- neurosurgery signed off -- continue prophylactic keppra for another couple days -- mentation remains the same. No improvement.   Dysphagia- severe.  --Evaluated several times by speech therapy and she is unable to take anything safely by mouth and unlikely to recover this function.  - continue NG tube. GOC discussions with family ongoing of PEG vs comfort care. In conversation with husband today looks like families intuits peg feeding --once final decision by family is made, will proceed with arrangements.  --12/8-- husband is agreeable for PEG placement. IR on call Dr Archer Asa is aware and will place on the schedule when feasible. -- Husband is requesting speech therapy to follow. Discussed with him they have already seen and given recommendation how were  he insisted patient see if his speech therapist. I have sent message to weekday speech therapist.    Acute metabolic encephalopathy VAD (vascular dementia)  (HCC)  History of stroke. Failure to thrive. Patient has severe dementia at baseline, she is nonverbal.  She has a worsening mental status since admission and appears to have stabilized at new baseline. Long-term prognosis very poor,  Husband states that he would not be able to care for her at home in this condition. - TOC consulted - ongoing GOC conversations   Hypokalemia. Hypophosphatemia. Hyponatremia. Conditions are resolved, continue monitoring.   Hypoglycemia secondary to poor p.o. intake. Nutrition Status: Nutrition Problem: Severe Malnutrition Etiology: social / environmental circumstances Signs/Symptoms: severe fat depletion, severe muscle depletion   - continue NG tube feeding.   Essential hypertension --Discontinued losartan due to low blood pressure.   Hypothyroidism --Continue levothyroxine   overall poor prognosis   Family communication : husband at bedside Consults : neurosurgery CODE STATUS: full DVT Prophylaxis : Lovenox Level of care: Med-Surg Status is: Inpatient Remains inpatient appropriate because: complex medical issues. PEG placement next week.  TOC for discharge planning to home per husband.  TOTAL TIME TAKING CARE OF THIS PATIENT: 35 minutes.  >50% time spent on counselling and coordination of care  Note: This dictation was prepared with Dragon dictation along with smaller phrase technology. Any transcriptional errors that result from this process are unintentional.  Enedina Finner M.D    [facility]   CC: Primary care physician; Annita Brod, MD

## 2022-12-20 NOTE — Plan of Care (Signed)
  Problem: Education: Goal: Knowledge of condition and prescribed therapy will improve Outcome: Progressing   Problem: Clinical Measurements: Goal: Will remain free from infection Outcome: Progressing   Problem: Nutrition: Goal: Adequate nutrition will be maintained Outcome: Progressing   Problem: Coping: Goal: Level of anxiety will decrease Outcome: Progressing   Problem: Elimination: Goal: Will not experience complications related to bowel motility Outcome: Progressing Goal: Will not experience complications related to urinary retention Outcome: Progressing   Problem: Pain Management: Goal: General experience of comfort will improve Outcome: Progressing   Problem: Skin Integrity: Goal: Risk for impaired skin integrity will decrease Outcome: Progressing

## 2022-12-21 ENCOUNTER — Encounter: Payer: Medicare Other | Admitting: Physician Assistant

## 2022-12-21 DIAGNOSIS — Z515 Encounter for palliative care: Secondary | ICD-10-CM | POA: Diagnosis not present

## 2022-12-21 DIAGNOSIS — E43 Unspecified severe protein-calorie malnutrition: Secondary | ICD-10-CM | POA: Diagnosis not present

## 2022-12-21 DIAGNOSIS — S065X0D Traumatic subdural hemorrhage without loss of consciousness, subsequent encounter: Secondary | ICD-10-CM | POA: Diagnosis not present

## 2022-12-21 DIAGNOSIS — F01C18 Vascular dementia, severe, with other behavioral disturbance: Secondary | ICD-10-CM | POA: Diagnosis not present

## 2022-12-21 LAB — BASIC METABOLIC PANEL
Anion gap: 9 (ref 5–15)
BUN: 17 mg/dL (ref 8–23)
CO2: 21 mmol/L — ABNORMAL LOW (ref 22–32)
Calcium: 8.7 mg/dL — ABNORMAL LOW (ref 8.9–10.3)
Chloride: 108 mmol/L (ref 98–111)
Creatinine, Ser: 0.69 mg/dL (ref 0.44–1.00)
GFR, Estimated: >60 mL/min (ref >60)
Glucose, Bld: 136 mg/dL — ABNORMAL HIGH (ref 70–99)
Potassium: 4.4 mmol/L (ref 3.5–5.1)
Sodium: 138 mmol/L (ref 135–145)

## 2022-12-21 LAB — GLUCOSE, CAPILLARY
Glucose-Capillary: 101 mg/dL — ABNORMAL HIGH (ref 70–99)
Glucose-Capillary: 112 mg/dL — ABNORMAL HIGH (ref 70–99)
Glucose-Capillary: 119 mg/dL — ABNORMAL HIGH (ref 70–99)
Glucose-Capillary: 126 mg/dL — ABNORMAL HIGH (ref 70–99)
Glucose-Capillary: 128 mg/dL — ABNORMAL HIGH (ref 70–99)
Glucose-Capillary: 131 mg/dL — ABNORMAL HIGH (ref 70–99)

## 2022-12-21 MED ORDER — CEFAZOLIN SODIUM-DEXTROSE 2-4 GM/100ML-% IV SOLN
2.0000 g | INTRAVENOUS | Status: AC
  Administered 2022-12-22: 2 g via INTRAVENOUS
  Filled 2022-12-21: qty 100

## 2022-12-21 NOTE — Progress Notes (Signed)
   12/21/22 1100  Spiritual Encounters  Type of Visit Follow up  Care provided to: Significant other  Referral source Chaplain assessment  Reason for visit Routine spiritual support  OnCall Visit Yes  Spiritual Framework  Presenting Themes Significant life change  Strengths Family and faith  Family Stress Factors Major life changes  Interventions  Spiritual Care Interventions Made Established relationship of care and support;Compassionate presence;Reflective listening;Normalization of emotions;Explored ethical dilemma;Meditation;Prayer  Intervention Outcomes  Outcomes Connection to spiritual care;Awareness of support  Spiritual Care Plan  Spiritual Care Issues Still Outstanding No further spiritual care needs at this time (see row info)   Spoke with the patient's husband and he spoke on his wife medical needs. He and the family has a plan for Mrs. Popson when patient is discharge to their home. I et Mr. Bester know that we are here for him if he need someone to talk .

## 2022-12-21 NOTE — Progress Notes (Signed)
Progress Note Patient: Christine Hull NFA:[id number] DOB: 10/15/1950 DOA: 12/04/2022     17 DOS: the patient was seen and examined on 12/21/2022   Brief hospital course: 72 year old female with past medical history of hypothyroidism, hypertension, prior stroke with advanced vascular dementia brought into the ED after decreased alertness and activity level. She had a fall around 2 weeks prior to arrival. In the ER found to have bilateral subdural hematomas with resultant 4 mm midline shift to the left and possible subarachnoid hemorrhage.  Bilateral burr hole procedure done by neurosurgery on 11/25.  11/26: repeat head CT- Decrease in size of bilateral subdural collections 5 mm on the right (was 1.5 cm previously), 6 mm on the left (was previously 1.1 cm).  Resolution of midline shift. 11/27.  Patient is evaluated by speech therapy, still not safe to for oral intake.  Place NG tube start tube feeding. 11/29.  Neurosurgery has removed wound drain. 11/30.  Patient developed a high fever, CT abdomen showed acalculous cholecystitis.  Started Zosyn for medical management. General surgery consulted  12/6: patient stable from neurological standpoint. Staples can be removed today. Awaiting PEG placement per family decision as well as PT/OT, SLP evaluation and home discharge planning. Completed seizure ppx and almost completed with Abx course for cholecystitis.   Principal Problem:   Traumatic subdural hematoma without loss of consciousness (HCC) Active Problems:   Traumatic subarachnoid hemorrhage (HCC)   Acute metabolic encephalopathy   Hypothyroidism   VAD (vascular dementia) (HCC)   Hypokalemia   Essential hypertension   Sinus bradycardia   History of fall   History of CVA (cerebrovascular accident)   Protein-calorie malnutrition, severe   Hypophosphatemia   Hyponatremia   Acute acalculous cholecystitis   Lethargy   Goals of care, counseling/discussion   Acalculous cholecystitis   SDH  (subdural hematoma) (HCC)  Assessment and Plan: Acute acalculous cholecystitis. Severe sepsis. Patient developed significant fever and tachycardia, hypotension 12/1. CT abdomen pelvis showed acute acalculous cholecystitis 11/30.   General surgery consulted and did not recommend surgical intervention. Patient was started on Zosyn. Continues to remain stable at this point.  - transition to augmentin for total 10 day Abx course.   Traumatic subdural hematoma  Traumatic subarachnoid hemorrhage History of fall with head trauma 2 weeks POA Imaging showed bilateral subdural hematomas with resultant 4 mm midline shift to the left and possible subarachnoid hemorrhage Neurosurgery consulted and performed bilateral burr hole procedure 11/25.  Repeat head imaging post-procedure 11/26, 12/1 showed stable condition. Bilateral drains removed 11/29.  Her mental status appears to be stable and not improving significantly since admission and about 4 weeks from initial injury. I predict that further significant improvement is not likely at this time. She was previously non-verbal at baseline. - analgesia PRN - repeat head imaging if focal changes occur - palliative consulted, appreciate your care - staples to be removed 12/9 - neurosurgery signed off - prophylactic keppra completed - PT/OT to reevaluate   Dysphagia- severe. Evaluated several times by speech therapy and she is unable to take anything safely by mouth and unlikely to recover this function.  - continue NG tube. GOC discussions with family and they have decided to move forward with PEG. IR was consulted over the weekend and will evaluate for placement of PEG.  - SLP reevaluation today     Acute metabolic encephalopathy VAD (vascular dementia) (HCC)  History of stroke. Failure to thrive. Patient has severe dementia at baseline, she is nonverbal.  She has  a worsening mental status since admission and appears to have stabilized at new baseline.  Long-term prognosis very poor, palliative care consult obtained. Husband has now decided that he will care for her at home.  - TOC consulted - ongoing GOC conversations   Hypokalemia. Hypophosphatemia. Hyponatremia. Conditions are resolved, continue monitoring.    Hypoglycemia secondary to poor p.o. intake. - continue tube feeding.   Essential hypertension Discontinued losartan due to low blood pressure.   Hypothyroidism Continue levothyroxine    Subjective:  Patient is nonverbal, currently she has no complaint. Opens eyes but does not appear to acknowledge those around her or follow those talking with her. Unable to follow commands.  Physical Exam: Vitals:   12/20/22 0508 12/20/22 0907 12/20/22 2053 12/21/22 0431  BP: (!) 106/92 111/80 114/87 117/71  Pulse: 91 94 90 89  Resp: 16 18 16 16   Temp: 98.7 F (37.1 C) 99.5 F (37.5 C) 99.3 F (37.4 C) 99 F (37.2 C)  TempSrc:  Axillary    SpO2: 100% 100% 96% 100%  Weight:      Height:       General exam: Appears calm and comfortable  Respiratory system: Respiratory effort normal. Cardiovascular system: peripheral perfusion intact, No pedal edema. Gastrointestinal system: Abdomen is nondistended, soft and does not appear tender to palpation. No organomegaly or masses felt.  Central nervous system: does not respond to verbal stimuli and nonverbal. Does not follow commands Extremities: trace edema Skin: No rashes, lesions or ulcers. Staples on bilateral scalp  Data Reviewed:  Lab results reviewed.  Family Communication: husband at bedside.  Disposition: Status is: Inpatient Remains inpatient appropriate because: Peg placement, GOC, placement     Time spent: 55 minutes  Author: Leeroy Bock, MD 12/21/2022 7:14 AM  For on call review [email or web address].

## 2022-12-21 NOTE — Evaluation (Addendum)
Physical Therapy Evaluation Patient Details Name: Christine Hull MRN: [id number] DOB: 1950-08-29 Today's Date: 12/21/2022  History of Present Illness  Pt is a 72 year old female presenting to the ED after a fall, noted with progressive weakness, functional decline.  Admitted for management of bila SDH, s/p bilateral burr holes (11/25).  Hospital course additionally significnat for acalculous cholecystitis.  Plan for PEG placement.   PMH significant for hypothyroidism, hypertension, prior stroke with vascular dementia  Clinical Impression  Patient resting in bed upon arrival to room, nursing just finishing up gown/linen change. Husband at bedside; very engaged in patient care. Upon conclusion of nursing care, patient resting with eyes closed.  Minimally responsive to light touch, name/voice; ultimately requiring cool washcloth to face for increased wakefulness.  Does visually acknowledge and track therapist in bilat visual fields; very minimal attempts at any vocalization throughout session. Patient with some spontaneous movement throughout all extremities (at least 3-/5); intermittently resistant to facilitated or act assist movement beyond her self-preferred range.  No clinical indicators of pain with movement.  Does demonstrate some response to light touch and noxious stim to bilat LEs/feet and bilat arms/hands; no significant sensory deficit suspected. Currently requiring max/total assist for repositioning and bed mobility; mod to total assist for unsupported sitting balance.  Requires total assist for LE position/management; does partially assist truncal elevation, but requires max manual cuing to initiate/facilitate. Unsupported sitting balance fluctuates - mod to dependant assist - for heavy posterior LOB (intermittent retropulsion), minimal/no spontaneous righting reactions noted. Increased fidgeting/agitation with sitting >3-4 minutes, requiring return to supine for calming and repositioning.   Unsafe to attempt additional mobility or OOB efforts at this time; will continue to assess/progress as appropriate. Would benefit from skilled PT ton a 3- visit trial o address above deficits and promote optimal return to PLOF (pending ability to actively participate/progress); recommend post-acute PT follow up as indicated by interdisciplinary care team.     Of note, if patient/family opt for discharge home (to facilitate familiar environment, routine), recommend 24/7 assist (+2 with mobility), max HH support (PT, OT, RN, aide) as well as hospital bed, manual WC with cushion, hoyer lift and BSC to assist with routine care needs.  Husband aware of recommendations and current level of care, equipment required to support.      If plan is discharge home, recommend the following: Two people to help with bathing/dressing/bathroom;Two people to help with walking and/or transfers   Can travel by private vehicle   No    Equipment Recommendations BSC/3in1;Wheelchair cushion (measurements PT);Wheelchair (measurements PT);Hoyer lift;Hospital bed  Recommendations for Other Services       Functional Status Assessment Patient has had a recent decline in their functional status and/or demonstrates limited ability to make significant improvements in function in a reasonable and predictable amount of time     Precautions / Restrictions Precautions Precautions: Fall Precaution Comments: NPO/NGT (pending PEG) Restrictions Weight Bearing Restrictions: No      Mobility  Bed Mobility Overal bed mobility: Needs Assistance Bed Mobility: Rolling Rolling: Total assist, +2 for physical assistance, Max assist         General bed mobility comments: total assist for LE position/management; does partially assist truncal elevation, but requires max manual cuing to initiate/facilitate.  Unsupported sitting balance fluctuates - mod to dependant assist - for heavy posterior LOB (intermittent retropulsion),  minimal/no spontaneous righting reactions noted.  Increased fidgeting/agitation with sitting >3-4 minutes, requiring return to supine for calming and repositioning.    Transfers  General transfer comment: unsafe/unable    Ambulation/Gait               General Gait Details: unsafe/unable  Stairs            Wheelchair Mobility     Tilt Bed    Modified Rankin (Stroke Patients Only)       Balance Overall balance assessment: Needs assistance Sitting-balance support: No upper extremity supported, Feet supported Sitting balance-Leahy Scale: Poor Sitting balance - Comments: fluctuates from mod to max/total assist, intermittent posterior LOB vs active retropulsion, minimal/no sponateous righting                                     Pertinent Vitals/Pain Pain Assessment Pain Assessment: PAINAD Breathing: normal Negative Vocalization: none Facial Expression: sad, frightened, frown Body Language: tense, distressed pacing, fidgeting Consolability: no need to console PAINAD Score: 2    Home Living Family/patient expects to be discharged to:: Private residence Living Arrangements: Spouse/significant other;Other (Comment) (Private pay aide, 2 hours/day) Available Help at Discharge: Family Type of Home: House             Additional Comments: Social history provided by husband    Prior Function               Mobility Comments: Per husband, patient ambulatory without assist at recent baseline (prior ot fall), has experienced progressive functional decline since fall. ADLs Comments: Per husband, negotiates tub/shower combo (with aide assist) for standing shower; incontinent of bowel/bladder, reliant on depends for toileting needs (dep assist from husband/aide for hygiene).  Husband responsible for iADLs, household chores/responsibilities.  Does have aide assist 2 hours/day (assist largely for ADLs and some mobility)      Extremity/Trunk Assessment   Upper Extremity Assessment Upper Extremity Assessment: Generalized weakness (grossly at least 3-/5 as noted with spontaneous movement during eval; limited ability to follow act assist ROM, intermittent resistant to facilitated movement by therapist)    Lower Extremity Assessment Lower Extremity Assessment: Generalized weakness (grossly at least 3-/5 as noted with spontaneous movement during eval; limited ability to follow act assist ROM, intermittent resistant to facilitated movement by therapist. Responds to light touch, pain to bilat feet)       Communication   Communication Communication: Difficulty communicating thoughts/reduced clarity of speech;Difficulty following commands/understanding Cueing Techniques: Verbal cues;Visual cues;Gestural cues;Tactile cues  Cognition Arousal: Alert Behavior During Therapy: Flat affect Overall Cognitive Status: History of cognitive impairments - at baseline                                 General Comments: Some spontanous vocalizations (unintelligble, non-meaningful), largely towards husband rather than therapist.  Does visually track therapist bilat visual fields.  Unable to follow purposeful, isolated commands; intermittent participation with act assist movement and activities (occasionally resistant).        General Comments      Exercises Other Exercises Other Exercises: Extended discussion with husband re: evaluation process, therapy considerations and decision-making specific to recommendations (acute change, potential to participate/progress), discharge options and implications of each, equpiment needs, rehab potential/prognosis (and recs for +2) in current state, benefit of normal routine/familiarity in the setting of advanced dementia, signs of agitation/intolerance of activity (increased restlessness/fidgeting) and implications on mobility progression.  Husband voiced understanding of all  information; all questions within scope answered to best of ability.  Husband appreciative of information and therpaist involvement.   Assessment/Plan    PT Assessment Patient needs continued PT services  PT Problem List Decreased knowledge of precautions;Decreased strength;Decreased range of motion;Decreased activity tolerance;Decreased balance;Decreased mobility;Decreased coordination;Decreased cognition;Decreased knowledge of use of DME;Decreased safety awareness       PT Treatment Interventions Gait training;DME instruction;Functional mobility training;Therapeutic activities;Therapeutic exercise;Balance training;Cognitive remediation;Patient/family education;Neuromuscular re-education    PT Goals (Current goals can be found in the Care Plan section)  Acute Rehab PT Goals Patient Stated Goal: per husband, to trial rehab; considering STR vs HHPT (leaning towards home) PT Goal Formulation: With family Time For Goal Achievement: 01/04/23 Potential to Achieve Goals: Fair    Frequency Min 1X/week     Co-evaluation               AM-PAC PT "6 Clicks" Mobility  Outcome Measure Help needed turning from your back to your side while in a flat bed without using bedrails?: Total Help needed moving from lying on your back to sitting on the side of a flat bed without using bedrails?: Total Help needed moving to and from a bed to a chair (including a wheelchair)?: Total Help needed standing up from a chair using your arms (e.g., wheelchair or bedside chair)?: Total Help needed to walk in hospital room?: Total Help needed climbing 3-5 steps with a railing? : Total 6 Click Score: 6    End of Session     Patient left: in bed;with bed alarm set;with family/visitor present;with call bell/phone within reach Nurse Communication: Mobility status PT Visit Diagnosis: Other symptoms and signs involving the nervous system (R29.898)    Time: 3086-5784 PT Time Calculation (min) (ACUTE ONLY): 44  min   Charges:   PT Evaluation $PT Re-evaluation: 1 Re-eval PT Treatments $Therapeutic Activity: 8-22 mins PT General Charges $$ ACUTE PT VISIT: 1 Visit         Alaysha Jefcoat H. Manson Passey, PT, DPT, NCS 12/21/22, 10:09 PM [phone]

## 2022-12-21 NOTE — TOC Progression Note (Signed)
Transition of Care [facility]) - Progression Note    Patient Details  Name: Christine Hull MRN: [id number] Date of Birth: [birth year]  [facility] CM/SW Contact  Allena Katz, LCSW Phone Number: 12/21/2022, 10:48 AM  Clinical Narrative:   CSW spoke with spouse who reports he is wanting to take pt home. He would like pt re evaluated by PT/OT/SPEECH and is likely electing, per our conversation for [facility] measures. He is using a [facility]CS agency called [facility] out of [place] and has aides in for two hours a day with them. CSW will follow back up with spouse once PT has worked with patient.    Expected Discharge Plan:  (TBD) Barriers to Discharge: Continued Medical Work up  Expected Discharge Plan and Services       Living arrangements for the past 2 months: Single Family Home                                       Social Determinants of Health (SDOH) Interventions SDOH Screenings   Food Insecurity: Patient Unable To Answer (12/06/2022)  Housing: Patient Unable To Answer (12/06/2022)  Transportation Needs: Patient Unable To Answer (12/06/2022)  Utilities: Patient Unable To Answer (12/06/2022)  Tobacco Use: High Risk (12/07/2022)    Readmission Risk Interventions     No data to display

## 2022-12-21 NOTE — Consult Note (Signed)
Chief Complaint: Patient was seen in consultation today for  Chief Complaint  Patient presents with   Head Injury   Referring Physician(s): Dr. Enedina Finner   Supervising Physician: Ruel Favors  Patient Status: [facility] - In-pt  History of Present Illness: Christine Hull is a 72 y.o. female with a medical history significant for HTN, prior stroke and advanced vascular dementia. She presented to the ED 12/04/22 with decreased alertness and activity level. She had fallen two weeks prior to arrival. In the ED she was found to have bilateral subdural hematomas with a midline shift and possible subarachnoid hemorrhage. She underwent bilateral burr hold procedure by neurosurgery 12/07/22.   She has exhibited significant dysphagia since then and has received nutrition via NG tube. Interventional Radiology has been asked to evaluate this patient for an image-guided gastrostomy tube placement to facilitate her long-term nutritional needs. Imaging reviewed and procedure approved by Dr. Miles Costain.   Past Medical History:  Diagnosis Date   Dementia (HCC)    Heart murmur    Hypertension    Hypothyroidism    Medtronic LINQ 05/09/2013   Near syncope 02/02/2013   Prolonged QT interval 02/02/2013   Sinus bradycardia 05/09/2013    Past Surgical History:  Procedure Laterality Date   AUGMENTATION MAMMAPLASTY Bilateral 1990's   "had implants put in; had them taken out 6 months later" (02/02/2013)   BREAST BIOPSY Right 2011   Sankar,  normal   BURR HOLE Bilateral 12/07/2022   Procedure: Ezekiel Ina;  Surgeon: Lovenia Kim, MD;  Location: [facility] ORS;  Service: Neurosurgery;  Laterality: Bilateral;  Bilateral Burr holes   LOOP RECORDER IMPLANT N/A 02/03/2013   Procedure: LOOP RECORDER IMPLANT;  Surgeon: Duke Salvia, MD;  Location: [facility] CATH LAB;  Service: Cardiovascular;  Laterality: N/A;    Allergies: Patient has no known allergies.  Medications: Prior to Admission medications   Medication  Sig Start Date End Date Taking? Authorizing Provider  donepezil (ARICEPT) 10 MG tablet Take 10 mg by mouth daily. 11/01/22  Yes [provider]  liothyronine (CYTOMEL) 5 MCG tablet TAKE 1 TABLET DAILY (PLEASE CONTACT OFFICE FOR A FOLLOW UP FOR FURTHER REFILLS. LAST REFILL WITHOUT OFFICE VISIT) 12/14/18  Yes Sherlene Shams, MD  liver oil-zinc oxide (DESITIN) 40 % ointment Apply 1 application topically as needed for irritation. 02/16/21  Yes Fondaw, Wylder S, PA  nadolol (CORGARD) 20 MG tablet Take 1 tablet by mouth daily. 07/21/21  Yes [provider]  sertraline (ZOLOFT) 50 MG tablet TAKE 1 TABLET DAILY 08/22/19  Yes Sherlene Shams, MD  SYNTHROID 50 MCG tablet TAKE 1 TABLET DAILY BEFORE BREAKFAST (PLEASE CONTACT OFFICE FOR A FOLLOW UP FOR FURTHER REFILLS) 04/12/17  Yes Sherlene Shams, MD  triamcinolone cream (KENALOG) 0.5 % APPLY THIN LAYER TOPICALLY TO THE AFFECTED AREA TWICE DAILY   Yes [provider]  losartan (COZAAR) 100 MG tablet Take 1 tablet (100 mg total) by mouth daily. 02/19/17 05/20/17  Sherlene Shams, MD     Family History  Problem Relation Age of Onset   Early death Mother        stroke resulting from prior head injury   Stroke Mother    Early death Father        MVA   Cancer Maternal Aunt        stomach cancer   Breast cancer Neg Hx     Social History   Socioeconomic History   Marital status: Married  Spouse name: Not on file   Number of children: Not on file   Years of education: Not on file   Highest education level: Not on file  Occupational History   Not on file  Tobacco Use   Smoking status: Every Day    Current packs/day: 0.50    Average packs/day: 0.5 packs/day for 41.0 years (20.5 ttl pk-yrs)    Types: Cigarettes   Smokeless tobacco: Never  Substance and Sexual Activity   Alcohol use: Yes    Alcohol/week: 5.0 standard drinks of alcohol    Types: 5 Shots of liquor per week    Comment: 02/02/2013 "drink a shot  in my coffee ~  nightly before bed"   Drug use: No   Sexual activity: Not Currently  Other Topics Concern   Not on file  Social History Narrative   Not on file   Social Determinants of Health   Financial Resource Strain: Not on file  Food Insecurity: Patient Unable To Answer (12/06/2022)   Hunger Vital Sign    Worried About Running Out of Food in the Last Year: Patient unable to answer    Ran Out of Food in the Last Year: Patient unable to answer  Transportation Needs: Patient Unable To Answer (12/06/2022)   PRAPARE - Administrator, Civil Service (Medical): Patient unable to answer    Lack of Transportation (Non-Medical): Patient unable to answer  Physical Activity: Not on file  Stress: Not on file  Social Connections: Not on file    Review of Systems: A 12 point ROS discussed and pertinent positives are indicated in the HPI above.  All other systems are negative.  Review of Systems  Unable to perform ROS: Patient nonverbal    Vital Signs: BP 100/75 (BP Location: Right Arm)   Pulse 90   Temp 99.1 F (37.3 C) (Axillary)   Resp 19   Ht 5\' 5"  (1.651 m)   Wt 124 lb 1.9 oz (56.3 kg)   SpO2 99%   BMI 20.65 kg/m   Physical Exam Constitutional:      General: She is not in acute distress.    Comments: Eyes open. Patient unresponsive to verbal stimuli. Withdraws from pain per RN documentation.   Cardiovascular:     Rate and Rhythm: Normal rate.  Pulmonary:     Effort: Pulmonary effort is normal.  Skin:    Comments: Surgical site on scalp.      Imaging: CT HEAD WO CONTRAST ( )  Result Date: 12/13/2022 CLINICAL DATA:  Head trauma, moderate to severe. EXAM: CT HEAD WITHOUT CONTRAST TECHNIQUE: Contiguous axial images were obtained from the base of the skull through the vertex without intravenous contrast. RADIATION DOSE REDUCTION: This exam was performed according to the departmental dose-optimization program which includes automated exposure control, adjustment of the mA  and/or kV according to patient size and/or use of iterative reconstruction technique. COMPARISON:  CT head without contrast 12/08/2022 and 12/04/2022. FINDINGS: Brain: Bilateral burr holes again noted. The surgical drains were removed. Left greater than right pneumocephalus remains. The collections are decompressed bilaterally residual bilateral 5-6 mm extra-axial collections are present. No acute hemorrhage is present in either collection. Moderate atrophy and white matter changes are present. The ventricles are proportionate to the degree of atrophy. Deep brain nuclei are within normal limits. No acute or focal cortical infarcts are present. The brainstem and cerebellum are within normal limits. Vascular: No hyperdense vessel or unexpected calcification. Skull: The calvarium is otherwise intact. Skin  stables are in place over the burr holes. Sinuses/Orbits: Mild mucosal thickening is present in the right maxillary and sphenoid sinus. No fluid levels are present. The globes and orbits are within normal limits. Right-sided NG tube is in place. IMPRESSION: 1. Interval removal of bilateral surgical drains with partial decompression of bilateral extra-axial collections. 2. Residual bilateral 5-6 mm extra-axial collections. 3. No acute hemorrhage. 4. Moderate atrophy and white matter disease likely reflects the sequela of chronic microvascular ischemia. Electronically Signed   By: Marin Roberts M.D.   On: 12/13/2022 14:21   DG Chest Port 1 View  Result Date: 12/12/2022 CLINICAL DATA:  Fever EXAM: PORTABLE CHEST 1 VIEW COMPARISON:  Chest x-ray 12/09/2022 FINDINGS: Enteric tube extends below the diaphragm. There some increasing retrocardiac opacities. Right lung is clear. No pleural effusion or pneumothorax. Cardiomediastinal silhouette is within normal limits. No acute fractures are seen. IMPRESSION: Increasing retrocardiac opacities, atelectasis versus pneumonia. Electronically Signed   By: Darliss Cheney  M.D.   On: 12/12/2022 20:15   CT ABDOMEN WO CONTRAST  Result Date: 12/12/2022 CLINICAL DATA:  Preoperative for gastrostomy. EXAM: CT ABDOMEN WITHOUT CONTRAST TECHNIQUE: Multidetector CT imaging of the abdomen was performed following the standard protocol without IV contrast. RADIATION DOSE REDUCTION: This exam was performed according to the departmental dose-optimization program which includes automated exposure control, adjustment of the mA and/or kV according to patient size and/or use of iterative reconstruction technique. COMPARISON:  Abdominal radiographs 12/09/2022 FINDINGS: Lower chest: Moderate left and small right pleural effusions with basilar atelectasis or consolidation bilaterally. Hepatobiliary: No focal liver lesions. The gallbladder is distended with gallbladder wall thickening and pericholecystic edema/stranding. No stones are identified. The appearance is likely to represent cholecystitis, possibly acalculous cholecystitis or occult stones. No bile duct dilatation. Pancreas: Unremarkable. No pancreatic ductal dilatation or surrounding inflammatory changes. Spleen: Normal in size without focal abnormality. Adrenals/Urinary Tract: Adrenal glands are unremarkable. Kidneys are normal, without renal calculi, focal lesion, or hydronephrosis. Stomach/Bowel: Enteric tube terminates in the body of the stomach. No gastric wall thickening or distention. Visualized portions of small and large bowel are not abnormally distended. Stool-filled colon. Vascular/Lymphatic: Aortic atherosclerosis. No enlarged abdominal lymph nodes. Other: No free air or free fluid in the abdomen. Abdominal wall musculature appears intact. Musculoskeletal: Degenerative changes in the spine. IMPRESSION: 1. Gallbladder distention with gallbladder wall thickening and pericholecystic edema/stranding. Changes suggest acute cholecystitis. No stones are identified. Electronically Signed   By: Burman Nieves M.D.   On: 12/12/2022 19:02    DG Chest Port 1 View  Result Date: 12/09/2022 CLINICAL DATA:  Fever NG tube placement EXAM: PORTABLE CHEST 1 VIEW COMPARISON:  None Available. FINDINGS: Electronic device over the central chest. Atelectasis at the bases. No consolidation or effusion. Normal cardiac size. No pneumothorax. Esophageal tube tip below the diaphragm but incompletely visualized IMPRESSION: Esophageal tube tip below the diaphragm but incompletely visualized. Atelectasis at the bases. Electronically Signed   By: Jasmine Pang M.D.   On: 12/09/2022 21:47   DG Abd Portable 1V  Result Date: 12/09/2022 CLINICAL DATA:  Nasogastric tube placement. EXAM: PORTABLE ABDOMEN - 1 VIEW COMPARISON:  None Available. FINDINGS: A nasogastric tube is seen with the tip overlying the mid gastric body. No dilated bowel loops seen in the visualized upper and mid abdomen IMPRESSION: Nasogastric tube tip overlies the mid gastric body. Electronically Signed   By: Danae Orleans M.D.   On: 12/09/2022 18:47   CT HEAD WO CONTRAST ( )  Result Date: 12/08/2022 CLINICAL DATA:  follow up burr hole EXAM: CT HEAD WITHOUT CONTRAST TECHNIQUE: Contiguous axial images were obtained from the base of the skull through the vertex without intravenous contrast. RADIATION DOSE REDUCTION: This exam was performed according to the departmental dose-optimization program which includes automated exposure control, adjustment of the mA and/or kV according to patient size and/or use of iterative reconstruction technique. COMPARISON:  CT head 12/04/22 FINDINGS: Limitations: Motion degraded exam Brain: Interval postsurgical changes from bilateral burr hole craniotomies and placement of subdural drainage catheters. The right-sided drainage catheter terminates in the craniotomy. There is postoperative pneumocephalus that causes mass effect on the bilateral frontal lobes resulting in a somewhat peaked appearance of the bilateral frontal lobes. While nonspecific, this appearance  could be seen in the setting of tension pneumocephalus, which is not excluded. Compared to prior exam there is interval decrease in size of bilateral subdural collections which now measure up to 5 mm on the right 6 mm on the left, previously 1.5 cm on the right 1.1 cm on the left. There is interval resolution of midline shift. No new sites of hemorrhage. No CT evidence of an acute cortical infarct. There is slight interval increase in size of the right temporal horn, which may be secondary to decreased mass effect, but early hydrocephalus is not entirely excluded. Vascular: No hyperdense vessel or unexpected calcification. Skull: Bifrontal burr hole craniotomies. Sinuses/Orbits: No middle ear or mastoid effusion. Paranasal sinuses are grossly clear. Orbits are unremarkable. Other: None IMPRESSION: Motion degraded exam. 1. Interval postsurgical changes from bilateral burr hole craniotomies and placement of subdural drainage catheters. The right-sided drainage catheter terminates in the craniotomy defect. 2. Postoperative pneumocephalus causes mass effect on the bilateral frontal lobes resulting in a somewhat peaked appearance of the bilateral frontal lobes. While nonspecific, this appearance could be seen in the setting of tension pneumocephalus, which is not excluded. 3. Interval decrease in size of bilateral subdural collections which now measure up to 5 mm on the right and 6 mm on the left, previously 1.5 cm on the right 1.1 cm on the left. Interval resolution of midline shift. 4. Slight interval increase in size of the right temporal horn, which may be secondary to decreased mass effect, but early hydrocephalus is not entirely excluded. Recommend attention on follow up. Electronically Signed   By: Lorenza Cambridge M.D.   On: 12/08/2022 08:08   CT Cervical Spine Wo Contrast  Result Date: 12/04/2022 CLINICAL DATA:  Neck trauma (Age >= 65y) EXAM: CT CERVICAL SPINE WITHOUT CONTRAST TECHNIQUE: Multidetector CT  imaging of the cervical spine was performed without intravenous contrast. Multiplanar CT image reconstructions were also generated. RADIATION DOSE REDUCTION: This exam was performed according to the departmental dose-optimization program which includes automated exposure control, adjustment of the mA and/or kV according to patient size and/or use of iterative reconstruction technique. COMPARISON:  None Available. FINDINGS: Alignment: Straightening of normal lordosis. No traumatic subluxation. Skull base and vertebrae: No acute fracture. Vertebral body heights are maintained. The dens and skull base are intact. Soft tissues and spinal canal: No prevertebral fluid or swelling. No visible canal hematoma. Disc levels: Mild diffuse disc space narrowing and spurring. No high-grade canal stenosis. Upper chest: No acute findings. Other: None. IMPRESSION: Mild degenerative change. No acute fracture or subluxation of the cervical spine. Electronically Signed   By: Narda Rutherford M.D.   On: 12/04/2022 21:01   CT Head Wo Contrast  Result Date: 12/04/2022 CLINICAL DATA:  Trauma EXAM: CT HEAD WITHOUT CONTRAST TECHNIQUE: Contiguous axial  images were obtained from the base of the skull through the vertex without intravenous contrast. RADIATION DOSE REDUCTION: This exam was performed according to the departmental dose-optimization program which includes automated exposure control, adjustment of the mA and/or kV according to patient size and/or use of iterative reconstruction technique. COMPARISON:  MRI brain 07/17/2016.  CT head 11/12/2014. FINDINGS: Brain: There is right-sided holo hemispheric subdural hematoma measuring up to 17 mm in thickness. This is intermediate density. There is also left-sided holo hemispheric subdural hematoma measuring up to 10 mm which is predominantly intermediate density with a small amount of hyperdensity seen. There are additional very small areas of hyperdense acute subdural hematoma overlying  the falx measuring 3 mm or left predominantly anteriorly. There is resultant 4 mm of midline shift to the left. There is likely a small amount of subarachnoid hemorrhage in the left temporal lobe in the middle cranial fossa. There is no hydrocephalus or acute infarct. Gray-white matter distinction is preserved. Vascular: No hyperdense vessel or unexpected calcification. Skull: Normal. Negative for fracture or focal lesion. Sinuses/Orbits: No acute finding. Other: None. IMPRESSION: 1. Bilateral holo hemispheric subdural hematomas (likely acute on subacute), right greater than left. There is resultant 4 mm of midline shift to the left. 2. Likely small amount of subarachnoid hemorrhage in the left temporal lobe in the middle cranial fossa. These results were called by telephone at the time of interpretation on 12/04/2022 at 8:32 pm to provider [facility] , who verbally acknowledged these results. Electronically Signed   By: Darliss Cheney M.D.   On: 12/04/2022 20:33    Labs:  CBC: Recent Labs    12/13/22 0303 12/14/22 0303 12/15/22 0802 12/16/22 0503  WBC 25.2* 21.7* 12.5* 10.7*  HGB 9.6* 8.5* 9.7* 8.9*  HCT 28.7* 25.8* 29.1* 26.5*  PLT 401* 356 381 447*    COAGS: Recent Labs    12/13/22 0303  INR 1.3*    BMP: Recent Labs    12/14/22 0303 12/16/22 0503 12/17/22 0431 12/21/22 0909  NA 137 139 140 138  K 3.7 3.1* 3.6 4.4  CL 108 106 106 108  CO2 22 25 26  21*  GLUCOSE 105* 127* 128* 136*  BUN 11 8 10 17   CALCIUM 7.9* 8.2* 8.2* 8.7*  CREATININE 0.60 0.66 0.57 0.69  GFRNONAA >60 >60 >60 >60    LIVER FUNCTION TESTS: Recent Labs    12/10/22 0337 12/11/22 0437 12/12/22 0308  BILITOT 0.9 0.8 1.3*  AST 29 20 36  ALT 23 20 26   ALKPHOS 110 111 145*  PROT 6.7 6.8 6.9  ALBUMIN 2.4* 2.4* 2.3*    TUMOR MARKERS: No results for input(s): "AFPTM", "CEA", "CA199", "CHROMGRNA" in the last 8760 hours.  Assessment and Plan:  Fall with subarachnoid hemorrhage and persistent  dysphagia: Christine Hull, 72 year old female, is tentatively scheduled 12/22/22 for an image-guided gastrostomy tube placement. The procedure was discussed with the patient's husband at the bedside and he signed the consent form.   Risks and benefits image guided gastrostomy tube placement was discussed with the patient including, but not limited to the need for a barium enema during the procedure, bleeding, infection, peritonitis and/or damage to adjacent structures.  All of the patient's questions were answered, patient is agreeable to proceed. Tube feeds will be held starting at midnight. CBC and PT/INR ordered for the morning. Last dose of lovenox was today at 1021.  Consent signed and in chart.  Thank you for this interesting consult.  I greatly enjoyed meeting  Christine Hull and look forward to participating in their care.  A copy of this report was sent to the requesting provider on this date.  Electronically Signed: Alwyn Ren, AGACNP-BC [phone] 12/21/2022, 3:17 PM   I spent a total of 40 Minutes    in face to face in clinical consultation, greater than 50% of which was counseling/coordinating care for gastrostomy tube placement.

## 2022-12-21 NOTE — Progress Notes (Signed)
26 Staples removed from head per order.

## 2022-12-21 NOTE — Telephone Encounter (Signed)
Patient still admitted.

## 2022-12-21 NOTE — Progress Notes (Signed)
Speech Language Pathology Treatment: Dysphagia  Patient Details Name: Christine Hull MRN: [id number] DOB: [birth year] Today's Date: 12/21/2022 Time: 1030-1130 SLP Time Calculation (min) (ACUTE ONLY): 60 min  Assessment / Plan / Recommendation Clinical Impression  Pt seen for ongoing assessment of swallowing today per Family request; Husband present in room. Pt has remained NPO w/ NGT TFs d/t her inability to take/safely consume any oral intake - Cognitive decline is present in setting of new illness(SDH). Pt was made NPO on 12/06/22 s/p BSE results revealing no obvious response to presentation of po's. NGT present for nutrition/hydration needs; PEG placement is planned per Husband. Noted Neurosurgery notes: pt is s/p bilateral burr hole surgery for bilateral subdural hematomas with symptomatic mass effect. Drains removed now. Per report, pt is mostly nonverbal at baseline in setting of prior stroke with Vascular Dementia though she attempted a few mumbled phonations during session today w/ this SLP and Husband.   At this session, pt was positioned fully upright in bed and given MOD-MAX verbal/tactile/visual stimulation by this SLP to complete Oral care via swabs; for oral stimulation and swallowing practice. Oral care was attempted w/ aversive behavior responses pressing lips together, turning head, grimacing. Moved to trials of single ice chips(2) were able to be placed anteriorly in her mouth w/ MAX cues and encouragement. She allowed water to dribble anteriorly. When 1/2 tsps of applesauce were attempted, pt was somewhat accepting of small amounts (1/4 tsp amounts x3) b/f turning her head away on the last trial. Noted min increased Oral phase deficits c/b lengthier bolus management and smacking behaviors b/f a pharyngeal swallow was appreciated.    W/ no further engagement despite the cues and encouragement, No further po trials were attempted as pt was NOT Cognitively engaged to safely be given  po's; also did NOT want to increase risk for aversive behaviors.   Education provided to Husband on pt's presentation of Dysphagia in setting of Cognitive decline, her response behaviors, risk for aspiration/aspiration pneumonia w/ any oral intake, and need for suppoert and/or discussion of more long-term alternative means of feeding(PEG) w/ MD/Team d/t pt NOT taking po's -- Oral phase/Cognitive deficits. Discussed offering attempts for oral care for hygiene and stimulation of swallowing as well as f/u ST services at next venue of care to continue to address oral stimulation and presentation of po's (therapeutic) to address her Dysphagia (in hopes of establishing some sort of oral diet again in the future). Husband agreed.   Pt appears to present w/ Severe-Profound oropharyngeal phase Dysphagia in setting of baseline Dementia, stroke and now new subdural hematomas w/ evacuation. HIGH risk for poor intake d/t Oral phase deficits and not accepting oral intake, and aspiration/aspiration pneumonia if given po's. Recommend NPO status w/ frequent oral care when receptive to it for hygiene and stimulation of swallowing; aspiration precautions.  Recommend ST services for dysphagia tx at her next venue of care, Rehab setting, when pt's medical and alert status' have improved for participation in therapy. Recommend ongoing discussion w/ Palliative Care for GOC including alternative means of feeding, dysphagia w/ Cognitive decline; Dietician f/u. MD/NSG updated and agreed.     HPI HPI: Christine Hull is a 72 y.o. female w/ Baseline Dementia who presented to ED following 2-week hx of shuffling gait, increased somnolence, and decreased ability to participate in ADLs. Symptoms started following an unwitnessed fall with head strike. Pt medical history significant for hypothyroidism, hypertension, prior stroke with vascular dementia. Head CT, "1. Bilateral holo hemispheric subdural hematomas (  likely acute on  subacute),  right greater than left. There is resultant 4 mm of  midline shift to the left.  2. Likely small amount of subarachnoid hemorrhage in the left  temporal lobe in the middle cranial fossa.".  Per Neurosurgery note: POD 2 s/p bilateral burr holes for  bilateral subdural hematomas with symptomatic mass effect.   Pt continues w/ a shutdown State presentation; not responding except to localized tactile stim.  NGT present.  Plan now is for a PEG for nutrition/hydration needs.      SLP Plan  All goals met      Recommendations for follow up therapy are one component of a multi-disciplinary discharge planning process, led by the attending physician.  Recommendations may be updated based on patient status, additional functional criteria and insurance authorization.    Recommendations  Diet recommendations: NPO (w/ oral care for stimulation of swallowing) Medication Administration: Via alternative means Supervision: Full supervision/cueing for compensatory strategies Compensations: Minimize environmental distractions                Rehab consult (f/u ST services at next venue of care) Oral care QID;Staff/trained caregiver to provide oral care   Frequent or constant Supervision/Assistance (w/ oral care) Dysphagia, oropharyngeal phase (R13.12) (baseline Dementia; new subdural hematomas w/ evacuation)     All goals met      [name], MS, CCC-SLP Speech Language Pathologist Rehab Services; [facility] [place] [phone] (ascom) Labria Wos  12/21/2022, 3:42 PM

## 2022-12-22 ENCOUNTER — Inpatient Hospital Stay: Payer: Medicare Other | Admitting: Radiology

## 2022-12-22 DIAGNOSIS — S065XAA Traumatic subdural hemorrhage with loss of consciousness status unknown, initial encounter: Secondary | ICD-10-CM | POA: Diagnosis not present

## 2022-12-22 DIAGNOSIS — R5383 Other fatigue: Secondary | ICD-10-CM | POA: Diagnosis not present

## 2022-12-22 DIAGNOSIS — K819 Cholecystitis, unspecified: Secondary | ICD-10-CM | POA: Diagnosis not present

## 2022-12-22 DIAGNOSIS — S065X0A Traumatic subdural hemorrhage without loss of consciousness, initial encounter: Secondary | ICD-10-CM | POA: Diagnosis not present

## 2022-12-22 HISTORY — PX: IR GASTROSTOMY TUBE MOD SED: IMG625

## 2022-12-22 LAB — GLUCOSE, CAPILLARY
Glucose-Capillary: 100 mg/dL — ABNORMAL HIGH (ref 70–99)
Glucose-Capillary: 119 mg/dL — ABNORMAL HIGH (ref 70–99)
Glucose-Capillary: 94 mg/dL (ref 70–99)
Glucose-Capillary: 94 mg/dL (ref 70–99)
Glucose-Capillary: 99 mg/dL (ref 70–99)
Glucose-Capillary: 99 mg/dL (ref 70–99)

## 2022-12-22 LAB — PROTIME-INR
INR: 1.2 (ref 0.8–1.2)
Prothrombin Time: 15.4 s — ABNORMAL HIGH (ref 11.4–15.2)

## 2022-12-22 LAB — CBC
HCT: 27.5 % — ABNORMAL LOW (ref 36.0–46.0)
Hemoglobin: 8.9 g/dL — ABNORMAL LOW (ref 12.0–15.0)
MCH: 30 pg (ref 26.0–34.0)
MCHC: 32.4 g/dL (ref 30.0–36.0)
MCV: 92.6 fL (ref 80.0–100.0)
Platelets: 527 10*3/uL — ABNORMAL HIGH (ref 150–400)
RBC: 2.97 MIL/uL — ABNORMAL LOW (ref 3.87–5.11)
RDW: 14.6 % (ref 11.5–15.5)
WBC: 9.4 10*3/uL (ref 4.0–10.5)
nRBC: 0 % (ref 0.0–0.2)

## 2022-12-22 MED ORDER — FREE WATER
120.0000 mL | Status: DC
Start: 2022-12-22 — End: 2022-12-28
  Administered 2022-12-22 – 2022-12-28 (×29): 120 mL

## 2022-12-22 MED ORDER — FENTANYL CITRATE (PF) 100 MCG/2ML IJ SOLN
INTRAMUSCULAR | Status: AC
  Filled 2022-12-22: qty 2

## 2022-12-22 MED ORDER — GLUCAGON HCL RDNA (DIAGNOSTIC) 1 MG IJ SOLR
INTRAMUSCULAR | Status: AC
Start: 2022-12-22 — End: ?
  Filled 2022-12-22: qty 1

## 2022-12-22 MED ORDER — GLUCAGON HCL RDNA (DIAGNOSTIC) 1 MG IJ SOLR
INTRAMUSCULAR | Status: AC | PRN
  Administered 2022-12-22: .5 mg via INTRAVENOUS

## 2022-12-22 MED ORDER — MIDAZOLAM HCL 2 MG/2ML IJ SOLN
INTRAMUSCULAR | Status: AC
  Filled 2022-12-22: qty 2

## 2022-12-22 MED ORDER — OSMOLITE 1.5 CAL PO LIQD
1000.0000 mL | ORAL | Status: DC
  Administered 2022-12-22 – 2022-12-27 (×5): 1000 mL

## 2022-12-22 MED ORDER — LIDOCAINE HCL 1 % IJ SOLN
INTRAMUSCULAR | Status: AC
  Filled 2022-12-22: qty 20

## 2022-12-22 MED ORDER — CEFAZOLIN SODIUM-DEXTROSE 2-4 GM/100ML-% IV SOLN
INTRAVENOUS | Status: AC
  Filled 2022-12-22: qty 100

## 2022-12-22 MED ORDER — LIDOCAINE HCL 1 % IJ SOLN
20.0000 mL | Freq: Once | INTRAMUSCULAR | Status: DC
  Filled 2022-12-22: qty 20

## 2022-12-22 MED ORDER — IOHEXOL 300 MG/ML  SOLN: 50.0000 mL | Freq: Once | INTRAMUSCULAR | Status: DC | PRN

## 2022-12-22 NOTE — TOC Progression Note (Signed)
Transition of Care [facility]) - Progression Note    Patient Details  Name: Christine Hull MRN: [id number] Date of Birth: 1950/10/17  Transition [facility]) CM/SW Contact  Allena Katz, LCSW Phone Number: 12/22/2022, 12:12 PM  Clinical Narrative:   Pt was supposed to have Peg tube today. Procedure was canceled.     Expected Discharge Plan:  (TBD) Barriers to Discharge: Continued Medical Work up  Expected Discharge Plan and Services       Living arrangements for the past 2 months: Single Family Home                                       Social Determinants of Health (SDOH) Interventions SDOH Screenings   Food Insecurity: Patient Unable To Answer (12/06/2022)  Housing: Patient Unable To Answer (12/06/2022)  Transportation Needs: Patient Unable To Answer (12/06/2022)  Utilities: Patient Unable To Answer (12/06/2022)  Tobacco Use: High Risk (12/07/2022)    Readmission Risk Interventions     No data to display

## 2022-12-22 NOTE — Progress Notes (Signed)
Progress Note Patient: Christine Hull:[id number] DOB: 27-Jan-1950 DOA: 12/04/2022     18 DOS: the patient was seen and examined on 12/22/2022   Brief hospital course: 72 year old female with past medical history of hypothyroidism, hypertension, prior stroke with advanced vascular dementia brought into the ED after decreased alertness and activity level. She had a fall around 2 weeks prior to arrival. In the ER found to have bilateral subdural hematomas with resultant 4 mm midline shift to the left and possible subarachnoid hemorrhage.  Bilateral burr hole procedure done by neurosurgery on 11/25.  11/26: repeat head CT- Decrease in size of bilateral subdural collections 5 mm on the right (was 1.5 cm previously), 6 mm on the left (was previously 1.1 cm).  Resolution of midline shift. 11/27.  Patient is evaluated by speech therapy, still not safe to for oral intake.  Place NG tube start tube feeding. 11/29.  Neurosurgery has removed wound drain. 11/30.  Patient developed a high fever, CT abdomen showed acalculous cholecystitis.  Started Zosyn for medical management. General surgery consulted  patient stable from neurological standpoint. Her prophylactic keppra has been discontinued. Staples removed 12/9.  Per patient family preference, PEG placement attempted today with IR but was not attempted due to overlying bowel. General surgery has been consulted for this as well and waiting to hear back.  Antibiotics for the cholecystitis will be completed tonight.  Family wanting to pursue SNF. TOC is informed.   Principal Problem:   Traumatic subdural hematoma without loss of consciousness (HCC) Active Problems:   Traumatic subarachnoid hemorrhage (HCC)   Acute metabolic encephalopathy   Hypothyroidism   VAD (vascular dementia) (HCC)   Hypokalemia   Essential hypertension   Sinus bradycardia   History of fall   History of CVA (cerebrovascular accident)   Protein-calorie malnutrition, severe    Hypophosphatemia   Hyponatremia   Acute acalculous cholecystitis   Lethargy   Goals of care, counseling/discussion   Acalculous cholecystitis   SDH (subdural hematoma) (HCC)  Assessment and Plan: Acute acalculous cholecystitis. Severe sepsis. Patient developed significant fever and tachycardia, hypotension 12/1. CT abdomen pelvis showed acute acalculous cholecystitis 11/30.   General surgery consulted and did not recommend surgical intervention. Patient was started on Zosyn. Continues to remain stable at this point.  - transition to augmentin for total 10 day Abx course. To complete 12/10.   Traumatic subdural hematoma  Traumatic subarachnoid hemorrhage History of fall with head trauma 2 weeks POA Imaging showed bilateral subdural hematomas with resultant 4 mm midline shift to the left and possible subarachnoid hemorrhage Neurosurgery consulted and performed bilateral burr hole procedure 11/25.  Repeat head imaging post-procedure 11/26, 12/1 showed stable condition. Bilateral drains removed 11/29.  Her mental status appears to be stable and not improving significantly since admission and >4 weeks from initial injury. I predict that further significant improvement is not likely at this time. She was previously non-verbal at baseline. - analgesia PRN - repeat head imaging if focal changes occur - palliative consulted, appreciate your care - staples removed 12/9 - neurosurgery signed off - prophylactic keppra completed - PT/OT to reevaluate   Dysphagia- severe. Evaluated several times by speech therapy and she is unable to take anything safely by mouth and unlikely to recover this function.  - continue NG tube. PEG placement not attempted by IR after evaluation 12/10, re-consulted surgery to evaluate.  - SLP following    Acute metabolic encephalopathy VAD (vascular dementia) (HCC)  History of stroke. Failure to thrive.  Patient has severe dementia at baseline, she is nonverbal.  She  has a worsening mental status since admission and appears to have stabilized at new baseline. Long-term prognosis very poor, palliative care consult obtained. Husband has now decided that he will care for her at home.  - TOC consulted - ongoing GOC conversations   Hypokalemia. Hypophosphatemia. Hyponatremia. Conditions are resolved, continue monitoring and reevaluate as transitioning to PEG feedings.  - registered dietician consulted    Hypoglycemia secondary to poor p.o. intake. - continue tube feeding.   Essential hypertension Discontinued losartan due to low blood pressure.   Hypothyroidism Continue levothyroxine    Subjective:  Patient is nonverbal, currently she has no complaint. Opens eyes and did appear to look at me just briefly when I addressed her today.   Physical Exam: Vitals:   12/21/22 0835 12/21/22 1526 12/21/22 1922 12/22/22 0523  BP: 100/75 119/85 107/83 93/68  Pulse: 90 95 95 91  Resp: 19 18 18    Temp: 99.1 F (37.3 C) 99.1 F (37.3 C) 99.3 F (37.4 C) 98 F (36.7 C)  TempSrc: Axillary Axillary    SpO2: 99% 97% 92% 98%  Weight:      Height:       General exam: Appears calm and comfortable  Respiratory system: Respiratory effort normal. Cardiovascular system: peripheral perfusion intact, No pedal edema. Gastrointestinal system: Abdomen is nondistended, soft and does not appear tender to palpation. No organomegaly or masses felt.  Central nervous system: does not respond to verbal stimuli and nonverbal. Does not follow commands Extremities: trace edema Skin: No rashes, lesions or ulcers. Staples on bilateral scalp  Data Reviewed:  Lab results reviewed.  Family Communication: husband at bedside.  Disposition: Status is: Inpatient Remains inpatient appropriate because: Peg placement, GOC, placement     Time spent: 55 minutes  Author: Leeroy Bock, MD 12/22/2022 7:20 AM  For on call review [email or web address].

## 2022-12-22 NOTE — Sedation Documentation (Signed)
No sedation per MD  

## 2022-12-22 NOTE — TOC Progression Note (Signed)
Transition of Care [facility]) - Progression Note    Patient Details  Name: EMAURI FOXALL MRN: [id number] Date of Birth: 1950-11-07  Transition of Care [facility]) CM/SW Contact  Allena Katz, LCSW Phone Number: 12/22/2022, 1:33 PM  Clinical Narrative:   CSW spoke with spouse regarding SNF. He states he is now agreeable to rehab and would like referrals sent out locally. He would like medicare.gov list. CSW to provide.     Expected Discharge Plan:  (TBD) Barriers to Discharge: Continued Medical Work up  Expected Discharge Plan and Services       Living arrangements for the past 2 months: Single Family Home                                       Social Determinants of Health (SDOH) Interventions SDOH Screenings   Food Insecurity: Patient Unable To Answer (12/06/2022)  Housing: Patient Unable To Answer (12/06/2022)  Transportation Needs: Patient Unable To Answer (12/06/2022)  Utilities: Patient Unable To Answer (12/06/2022)  Tobacco Use: High Risk (12/07/2022)    Readmission Risk Interventions     No data to display

## 2022-12-22 NOTE — Progress Notes (Signed)
Nutrition Follow-up  DOCUMENTATION CODES:   Severe malnutrition in context of social or environmental circumstances  INTERVENTION:   -D/c Osmolite 1.2 -TF via NGT:  Osmolite 1.5 @ 55 ml/hr   120 ml free water flush every 4 hours  Tube feeding regimen provides 1980 kcal (100% of needs), 83 grams of protein, and 1006 ml of H2O. Total free water: 1726 ml daily   NUTRITION DIAGNOSIS:   Severe Malnutrition related to social / environmental circumstances as evidenced by severe fat depletion, severe muscle depletion.  Ongoing  GOAL:   Patient will meet greater than or equal to 90% of their needs  Met with TF  MONITOR:   Diet advancement, Labs, Weight trends, TF tolerance, Skin, I & O's  REASON FOR ASSESSMENT:   Consult Enteral/tube feeding initiation and management  ASSESSMENT:   72 y/o female with h/o hypothyroidism, HTN, HLD, CVA, mood disorder and dementia who is admitted with chronic bilateral subdural hematoma now s/p right & left sided burr holes for drainage of subdural hematoma 11/25.  11/27- s/p BSE- NPO, NGT placed, TF initiated 11/29- subdural drains removed by neurosurgery 11/30- s/p BSE- recommend continued NPO, pt developed fever- CT abdomen showed acalculous cholecystitis- zosyn started  12/10- PEG placement unsuccessful  Reviewed I/O's: +10.1 L since 12/08/22   Case discussed with RN, MD and IR. PEG placement attempted by IR today, however, unable to place due to no safe window for percutaneous access. Per MD, plan to surgical g-tube placement. Received permission to resume TF via NGT today.   Per palliative care and TOC notes, pt family desire feeding tube placement and now desires SNF placement.   Medications reviewed.   Labs reviewed: CBGS: 94-126 (inpatient orders for glycemic control are none).    Diet Order:   Diet Order             Diet NPO time specified  Diet effective now                   EDUCATION NEEDS:   No education needs  have been identified at this time  Skin:  Skin Assessment: Skin Integrity Issues: Skin Integrity Issues:: Incisions Incisions: closed head  Last BM:  12/22/22 (type 6)  Height:   Ht Readings from Last 1 Encounters:  12/07/22 5\' 5"  (1.651 m)    Weight:   Wt Readings from Last 1 Encounters:  12/19/22 56.3 kg    Ideal Body Weight:  56.8 kg  BMI:  Body mass index is 20.65 kg/m.  Estimated Nutritional Needs:   Kcal:  1700-1900  Protein:  80-95 grams  Fluid:  > 1.7 L    Levada Schilling, RD, LDN, CDCES Registered Dietitian III Certified Diabetes Care and Education Specialist Please refer to [facility] for RD and/or RD on-call/weekend/after hours pager

## 2022-12-22 NOTE — Progress Notes (Signed)
Saw patient today for staple removal. Staples were removed successfully by RN yesterday without complications. This morning her incisions are clean dry and intact and healing well.  There are no signs of infection. Pt appears neurologically stable. She arouses easily to voice and independently moves all extremities.  She does not respond appropriately to orientation questions or follow commands. Per RN patient is to have PEG placement today. Will plan for outpatient follow-up as previously planned.  Please contact neurosurgery with any additional questions or concerns.   Manning Charity PA-C Neurosurgery.

## 2022-12-22 NOTE — Sedation Documentation (Signed)
Procedure cancelled for today by MD. Pt. VSS. Pt. To be sent back to her room now via teletracking.

## 2022-12-22 NOTE — Procedures (Signed)
Interventional Radiology  Note   fluoro exam of the abdomen performed with insufflation of the stomach with air.    Unfortunately, there is overlying gas distended bowel, mostly transverse colon and no safe window for fluoro percutaneous access.  Gtube unable to be performed by fluoro technique.  Rec surgery eval for placement       Sharen Counter, MD

## 2022-12-23 DIAGNOSIS — K810 Acute cholecystitis: Secondary | ICD-10-CM | POA: Diagnosis not present

## 2022-12-23 DIAGNOSIS — S066X0A Traumatic subarachnoid hemorrhage without loss of consciousness, initial encounter: Secondary | ICD-10-CM | POA: Diagnosis not present

## 2022-12-23 DIAGNOSIS — E43 Unspecified severe protein-calorie malnutrition: Secondary | ICD-10-CM | POA: Diagnosis not present

## 2022-12-23 DIAGNOSIS — K819 Cholecystitis, unspecified: Secondary | ICD-10-CM | POA: Diagnosis not present

## 2022-12-23 LAB — GLUCOSE, CAPILLARY
Glucose-Capillary: 113 mg/dL — ABNORMAL HIGH (ref 70–99)
Glucose-Capillary: 119 mg/dL — ABNORMAL HIGH (ref 70–99)
Glucose-Capillary: 121 mg/dL — ABNORMAL HIGH (ref 70–99)
Glucose-Capillary: 121 mg/dL — ABNORMAL HIGH (ref 70–99)
Glucose-Capillary: 121 mg/dL — ABNORMAL HIGH (ref 70–99)
Glucose-Capillary: 133 mg/dL — ABNORMAL HIGH (ref 70–99)

## 2022-12-23 NOTE — Consult Note (Signed)
GI Inpatient Consult Note  Reason for Consult: PEG Tube Consult    Attending Requesting Consult: Dr. Marrion Coy, MD  History of Present Illness: Christine Hull is a 72 y.o. female seen for evaluation of PEG tube consult at the request of hospitalist - Dr. Marrion Coy. Patient has a PMH of HTN, hypothyroidism, vascular dementia, hx of CVA, chronic back pain, and mood disorder. She was admitted to the hospital 11/22 for chief complaint of  decreased alertness and activity level found to have bilateral subdural hematomas with midline shift and possible subarachnoid hemorrhage s/p bilateral burr hole procedure 11/25. She has been evaluated by speech therapy on multiple occasions and deemed not safe for oral intake. She had NG tube placed for tube feedings on 11/27. She was found to have acalculous cholecystitis on 11/30 and has been treated non-operatively with IV antibiotics. IR was consulted for potential gastrostomy tube placement; however, no safe window was found for fluoro percutaneous access and evidence of overlying transverse colon. GI is consulted today for potential PEG tube placement.   Patient seen and examined this afternoon resting comfortably in hospital bed. Her husband, Allura Salameh, is present at bedside. Patient is unable to contribute to the history given her baseline mental status. She is no obvious distress at this time. She is currently receiving tube feeds. She has been unable to feed herself due to dysphagia and dementia.   Past Medical History:  Past Medical History:  Diagnosis Date   Dementia (HCC)    Heart murmur    Hypertension    Hypothyroidism    Medtronic LINQ 05/09/2013   Near syncope 02/02/2013   Prolonged QT interval 02/02/2013   Sinus bradycardia 05/09/2013    Problem List: Patient Active Problem List   Diagnosis Date Noted   Lethargy 12/17/2022   Goals of care, counseling/discussion 12/17/2022   Acalculous cholecystitis 12/17/2022   SDH  (subdural hematoma) (HCC) 12/17/2022   Acute acalculous cholecystitis 12/13/2022   Hyponatremia 12/12/2022   Hypophosphatemia 12/11/2022   Protein-calorie malnutrition, severe 12/09/2022   Traumatic subdural hematoma without loss of consciousness (HCC) 12/04/2022   History of fall 12/04/2022   Acute metabolic encephalopathy 12/04/2022   History of CVA (cerebrovascular accident) 12/04/2022   Traumatic subarachnoid hemorrhage (HCC) 12/04/2022   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) 08/09/2016   Late effect of lacunar infarction 07/19/2016   Hyperlipidemia LDL goal <100 04/28/2016   Alcohol abuse 05/31/2015   Cardiac device in situ 05/09/2013   Sinus bradycardia 05/09/2013   Essential hypertension 04/01/2013   Near syncope 02/02/2013   Headache 02/02/2013   VAD (vascular dementia) (HCC) 02/02/2013   Hypokalemia 02/02/2013   Prolonged QT interval 02/02/2013   Encounter for preventive health examination 11/17/2012   Chronic back pain greater than 3 months duration 08/16/2011   Hypothyroidism 08/14/2011   Tobacco abuse 08/13/2011    Past Surgical History: Past Surgical History:  Procedure Laterality Date   AUGMENTATION MAMMAPLASTY Bilateral 1990's   "had implants put in; had them taken out 6 months later" (02/02/2013)   BREAST BIOPSY Right 2011   Sankar,  normal   BURR HOLE Bilateral 12/07/2022   Procedure: Ezekiel Ina;  Surgeon: Lovenia Kim, MD;  Location: [facility] ORS;  Service: Neurosurgery;  Laterality: Bilateral;  Bilateral Burr holes   IR GASTROSTOMY TUBE MOD SED  12/22/2022   LOOP RECORDER IMPLANT N/A 02/03/2013   Procedure: LOOP RECORDER IMPLANT;  Surgeon: Duke Salvia, MD;  Location: [facility] CATH  LAB;  Service: Cardiovascular;  Laterality: N/A;    Allergies: No Known Allergies  Home Medications: Medications Prior to Admission  Medication Sig Dispense Refill Last Dose   donepezil (ARICEPT) 10 MG tablet Take 10 mg by mouth daily.   12/04/2022    liothyronine (CYTOMEL) 5 MCG tablet TAKE 1 TABLET DAILY (PLEASE CONTACT OFFICE FOR A FOLLOW UP FOR FURTHER REFILLS. LAST REFILL WITHOUT OFFICE VISIT) 90 tablet 3 12/04/2022   liver oil-zinc oxide (DESITIN) 40 % ointment Apply 1 application topically as needed for irritation. 56.7 g 0 prn at unk   nadolol (CORGARD) 20 MG tablet Take 1 tablet by mouth daily.   12/04/2022   sertraline (ZOLOFT) 50 MG tablet TAKE 1 TABLET DAILY 30 tablet 11 12/04/2022   SYNTHROID 50 MCG tablet TAKE 1 TABLET DAILY BEFORE BREAKFAST (PLEASE CONTACT OFFICE FOR A FOLLOW UP FOR FURTHER REFILLS) 90 tablet 0 12/04/2022   triamcinolone cream (KENALOG) 0.5 % APPLY THIN LAYER TOPICALLY TO THE AFFECTED AREA TWICE DAILY   prn at unk   losartan (COZAAR) 100 MG tablet Take 1 tablet (100 mg total) by mouth daily. 30 tablet 0    Home medication reconciliation was completed with the patient.   Scheduled Inpatient Medications:    enoxaparin (LOVENOX) injection  40 mg Subcutaneous Daily   free water  120 mL Per Tube Q4H   levothyroxine  50 mcg Per Tube Q0600   lidocaine  20 mL Intradermal Once   liothyronine  5 mcg Per Tube Daily   sertraline  50 mg Per Tube Daily   sodium chloride flush  3 mL Intravenous Q12H    Continuous Inpatient Infusions:    feeding supplement (OSMOLITE 1.5 CAL) 1,000 mL (12/22/22 1719)    PRN Inpatient Medications:  acetaminophen **OR** acetaminophen, iohexol, morphine injection, mouth rinse, oxyCODONE-acetaminophen  Family History: family history includes Cancer in her maternal aunt; Early death in her father and mother; Stroke in her mother.  [name] history is negative for inflammatory bowel disorders, GI malignancy, or solid organ transplantation.  Social History:   reports that she has been smoking cigarettes. She has a 20.5 pack-year smoking history. She has never used smokeless tobacco. She reports current alcohol use of about 5.0 standard drinks of alcohol per week. She reports  that she does not use drugs. The patient denies ETOH, tobacco, or drug use.   Review of Systems:  Unable to obtain due to patient's baseline mental status    Physical Examination: BP 125/73 (BP Location: Right Arm)   Pulse 97   Temp 99.9 F (37.7 C) (Axillary)   Resp 17   Ht 5\' 5"  (1.651 m)   Wt 56.3 kg   SpO2 99%   BMI 20.65 kg/m  Gen: NAD, alert and oriented x 4 HEENT: PEERLA, EOMI, NG tube in place Neck: supple, no JVD or thyromegaly Chest: CTA bilaterally, no wheezes, crackles, or other adventitious sounds CV: RRR, no m/g/c/r Abd: soft, ND, +BS in all four quadrants; no HSM, guarding, ridigity, or rebound tenderness Ext: no edema, well perfused with 2+ pulses, Skin: no rash or lesions noted Lymph: no LAD  Data: Lab Results  Component Value Date   WBC 9.4 12/22/2022   HGB 8.9 (L) 12/22/2022   HCT 27.5 (L) 12/22/2022   MCV 92.6 12/22/2022   PLT 527 (H) 12/22/2022   Recent Labs  Lab 12/22/22 0419  HGB 8.9*   Lab Results  Component Value Date   NA 138 12/21/2022   K  4.4 12/21/2022   CL 108 12/21/2022   CO2 21 (L) 12/21/2022   BUN 17 12/21/2022   CREATININE 0.69 12/21/2022   Lab Results  Component Value Date   ALT 26 12/12/2022   AST 36 12/12/2022   ALKPHOS 145 (H) 12/12/2022   BILITOT 1.3 (H) 12/12/2022   Recent Labs  Lab 12/22/22 0419  INR 1.2    Assessment/Plan:  72 y/o AA female with a PMH of HTN, hypothyroidism, vascular dementia, mood disorder, hx of CVA, and chronic back pain admitted to [facility] 11/22 for decreased mental status found to have bilateral subdural hematomas s/p bilateral burr hole procedure 11/25. GI consulted this morning for consideration of PEG tube placement.   Dysphagia - 2/2 vascular dementia   Traumatic subdural hematoma/traumatic SAH  Acute acalculous cholecystitis - non-operative management  Acute metabolic encephalopathy  Vascular dementia  Failure to thrive (FTT)  Electrolyte  derangement  Recommendations:  - PEG tube will be planned for tomorrow morning with Dr. Norma Fredrickson - No tube feeds after midnight tonight - See procedure note for findings and further recommendations  I reviewed the risks (including bleeding, perforation, infection, anesthesia complications, cardiac/respiratory complications), benefits and alternatives of PEG tube placement. Patient's husband, Jonny Ruiz, consents to proceed.    Thank you for the consult. Please call with questions or concerns.  Gilda Crease, PA-C [facility] [phone]

## 2022-12-23 NOTE — [place] FL2 (Signed)
[place] MEDICAID FL2 LEVEL OF CARE FORM     IDENTIFICATION  Patient Name: Christine Hull Birthdate: 08-08-1950 Sex: female Admission Date (Current Location): 12/04/2022  [facility] and [place] Number:  Chiropodist and Address:         Provider Number: [id number]  Attending Physician Name and Address:  Marrion Coy, MD  Relative Name and Phone Number:  Kassey, Khaled (Spouse)  [phone]    Current Level of Care: Hospital Recommended Level of Care: Skilled Nursing Facility Prior Approval Number:    Date Approved/Denied:   PASRR Number: [id number]  Discharge Plan: SNF    Current Diagnoses: Patient Active Problem List   Diagnosis Date Noted   Lethargy 12/17/2022   Goals of care, counseling/discussion 12/17/2022   Acalculous cholecystitis 12/17/2022   SDH (subdural hematoma) (HCC) 12/17/2022   Acute acalculous cholecystitis 12/13/2022   Hyponatremia 12/12/2022   Hypophosphatemia 12/11/2022   Protein-calorie malnutrition, severe 12/09/2022   Traumatic subdural hematoma without loss of consciousness (HCC) 12/04/2022   History of fall 12/04/2022   Acute metabolic encephalopathy 12/04/2022   History of CVA (cerebrovascular accident) 12/04/2022   Traumatic subarachnoid hemorrhage (HCC) 12/04/2022   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) 08/09/2016   Late effect of lacunar infarction 07/19/2016   Hyperlipidemia LDL goal <100 04/28/2016   Alcohol abuse 05/31/2015   Cardiac device in situ 05/09/2013   Sinus bradycardia 05/09/2013   Essential hypertension 04/01/2013   Near syncope 02/02/2013   Headache 02/02/2013   VAD (vascular dementia) (HCC) 02/02/2013   Hypokalemia 02/02/2013   Prolonged QT interval 02/02/2013   Encounter for preventive health examination 11/17/2012   Chronic back pain greater than 3 months duration 08/16/2011   Hypothyroidism 08/14/2011   Tobacco abuse 08/13/2011    Orientation RESPIRATION BLADDER Height &  Weight        Normal Incontinent Weight: 124 lb 1.9 oz (56.3 kg) Height:  5\' 5"  (165.1 cm)  BEHAVIORAL SYMPTOMS/MOOD NEUROLOGICAL BOWEL NUTRITION STATUS      Incontinent  (Pt getting a peg tube)  AMBULATORY STATUS COMMUNICATION OF NEEDS Skin   Extensive Assist Does not communicate  (incision to head)                       Personal Care Assistance Level of Assistance  Bathing, Dressing, Feeding Bathing Assistance: Maximum assistance Feeding assistance: Maximum assistance Dressing Assistance: Maximum assistance     Functional Limitations Info  Sight, Hearing, Speech Sight Info: Adequate Hearing Info: Impaired Speech Info: Impaired    SPECIAL CARE FACTORS FREQUENCY  PT (By licensed PT), OT (By licensed OT)     PT Frequency: 5 times a week OT Frequency: 5 Times a week            Contractures Contractures Info: Not present    Additional Factors Info  Code Status Code Status Info: FULL             Current Medications (12/23/2022):  This is the current hospital active medication list Current Facility-Administered Medications  Medication Dose Route Frequency Provider Last Rate Last Admin   acetaminophen (TYLENOL) tablet 650 mg  650 mg Per Tube Q6H PRN Otelia Sergeant, RPH   650 mg at 12/21/22 1832   Or   acetaminophen (TYLENOL) suppository 650 mg  650 mg Rectal Q6H PRN Otelia Sergeant, RPH       enoxaparin (LOVENOX) injection 40 mg  40 mg Subcutaneous Daily Marrion Coy, MD   40  mg at 12/23/22 0946   feeding supplement (OSMOLITE 1.5 CAL) liquid 1,000 mL  1,000 mL Per Tube Continuous Leeroy Bock, MD 55 mL/hr at 12/22/22 1719 1,000 mL at 12/22/22 1719   free water 120 mL  120 mL Per Tube Q4H Leeroy Bock, MD   120 mL at 12/23/22 1157   iohexol (OMNIPAQUE) 300 MG/ML solution 50 mL  50 mL Per Tube Once PRN Berdine Dance, MD       levothyroxine (SYNTHROID) tablet 50 mcg  50 mcg Per Tube Q0600 Otelia Sergeant, RPH   50 mcg at 12/23/22 0556   lidocaine  (XYLOCAINE) 1 % (with pres) injection 20 mL  20 mL Intradermal Once Berdine Dance, MD       liothyronine (CYTOMEL) tablet 5 mcg  5 mcg Per Tube Daily Lowella Bandy, RPH   5 mcg at 12/23/22 0947   morphine (PF) 2 MG/ML injection 1 mg  1 mg Intravenous Once PRN Manuela Schwartz, NP       Oral care mouth rinse  15 mL Mouth Rinse PRN Marrion Coy, MD       oxyCODONE-acetaminophen (PERCOCET/ROXICET) 5-325 MG per tablet 0.5 tablet  0.5 tablet Per NG tube Q4H PRN Marrion Coy, MD   0.5 tablet at 12/16/22 1817   sertraline (ZOLOFT) tablet 50 mg  50 mg Per Tube Daily Otelia Sergeant, RPH   50 mg at 12/23/22 0947   sodium chloride flush (NS) 0.9 % injection 3 mL  3 mL Intravenous Q12H Andris Baumann, MD   3 mL at 12/23/22 6045     Discharge Medications: Please see discharge summary for a list of discharge medications.  Relevant Imaging Results:  Relevant Lab Results:   Additional Information SS- [id number]  Allena Katz, LCSW

## 2022-12-23 NOTE — Progress Notes (Signed)
CC: Need for enteral access for feeding. Subjective: The patient is in bed.  She is unable to feed herself due to dysphagia and dementia  Objective: Vital signs in last 24 hours: Temp:  [98.1 F (36.7 C)-99.9 F (37.7 C)] 99.9 F (37.7 C) (12/11 0729) Pulse Rate:  [85-97] 97 (12/11 0729) Resp:  [15-20] 17 (12/11 0729) BP: (102-125)/(73-86) 125/73 (12/11 0729) SpO2:  [97 %-99 %] 99 % (12/11 0729) Last BM Date : 12/23/22  Intake/Output from previous day: No intake/output data recorded. Intake/Output this shift: No intake/output data recorded.  Physical exam: She has an NG tube in place getting tube feeds.  Her abdomen is soft, there is some mild tenderness in the right upper quadrant and left upper quadrant.  She is not distended.  There are no surgical scars   Lab Results: CBC  Recent Labs    12/22/22 0419  WBC 9.4  HGB 8.9*  HCT 27.5*  PLT 527*   BMET Recent Labs    12/21/22 0909  NA 138  K 4.4  CL 108  CO2 21*  GLUCOSE 136*  BUN 17  CREATININE 0.69  CALCIUM 8.7*   PT/INR Recent Labs    12/22/22 0419  LABPROT 15.4*  INR 1.2   ABG No results for input(s): "PHART", "HCO3" in the last 72 hours.  Invalid input(s): "PCO2", "PO2"  Studies/Results: IR GASTROSTOMY TUBE MOD SED  Result Date: 12/22/2022 INDICATION: Stroke, dysphagia EXAM: FLUORO EXAM OF THE ABDOMEN WITH STOMACH INSUFFLATION. MEDICATIONS: GLUCAGON 0.5 MG IV ANESTHESIA/SEDATION: Total intra-service moderate Sedation Time: NONE. The patient's level of consciousness and vital signs were monitored continuously by radiology nursing throughout the procedure under my direct supervision. CONTRAST:  NONE.-administered into the gastric lumen. FLUOROSCOPY: Radiation Exposure Index (as provided by the fluoroscopic device): 5.5 mGy Kerma COMPLICATIONS: None immediate. PROCEDURE: Informed written consent was obtained from [name] after a thorough discussion of the procedural risks, benefits and  alternatives. All questions were addressed. Maximal Sterile Barrier Technique was utilized including caps, mask, sterile gowns, sterile gloves, sterile drape, hand hygiene and skin antiseptic. A timeout was performed prior to the initiation of the procedure. Under fluoroscopy, a 5 French orogastric catheter was advanced into the stomach without difficulty. Fluoroscopy of the abdomen demonstrates marked gaseous distention of the bowel predominately in the transverse colon overlying the stomach. Stomach was insufflated with air. Despite insufflation, there is no clear safe fluoroscopic percutaneous access window related to the overlying air distended transverse colon. This correlates with the CT from 12/12/2022. Therefore, the procedure cannot be performed by fluoroscopic technique. Recommend surgical consultation for gastrostomy placement. IMPRESSION: Unable to perform percutaneous gastrostomy by fluoroscopic technique as above. See above recommendation. Electronically Signed   By: Judie Petit.  Shick M.D.   On: 12/22/2022 09:59    Anti-infectives: Anti-infectives (From admission, onward)    Start     Dose/Rate Route Frequency Ordered Stop   12/22/22 1100  ceFAZolin (ANCEF) IVPB 2g/100 mL premix        2 g 200 mL/hr over 30 Minutes Intravenous To Radiology 12/21/22 1523 12/22/22 0950   12/18/22 1000  amoxicillin-clavulanate (AUGMENTIN) 400-57 MG/5ML suspension 800 mg        800 mg Per Tube Every 12 hours 12/17/22 1038 12/22/22 2118   12/12/22 2200  piperacillin-tazobactam (ZOSYN) IVPB 3.375 g        3.375 g 12.5 mL/hr over 240 Minutes Intravenous Every 8 hours 12/12/22 2042 12/18/22 0230   12/12/22 1930  cefTRIAXone (ROCEPHIN) 2 g  in sodium chloride 0.9 % 100 mL IVPB  Status:  Discontinued        2 g 200 mL/hr over 30 Minutes Intravenous Every 24 hours 12/12/22 1840 12/12/22 2042   12/07/22 1520  ceFAZolin (ANCEF) IVPB 2g/100 mL premix        2 g 200 mL/hr over 30 Minutes Intravenous 30 min pre-op 12/07/22  1520 12/07/22 1638       Assessment/Plan:  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  She also had acute cholecystitis that was successfully treated with nonoperative management.  She has dementia and is unable to feed herself so we are consulted for potential gastrostomy tube.  She went to IR yesterday and they did not feel like they had a good window.  I had a long discussion about the ways of getting permanent enteral access for this patient with her husband.  I discussed that you can do this endoscopically or radiographically.  If these 2 methods fail then she would require surgical intervention for gastrostomy tube.  He seems quite reticent about having any surgical intervention.  I discussed the risk, benefits alternatives of this including the risk of the tube falling out damage to underlying structures, infection and bleeding.  I also reviewed her IR records.  They were concerned that there was overlying transverse colon.  Given that the patient's husband is quite concerned about surgical intervention which I think is appropriate I think it is a good idea to reach out to GI.  They could potentially reattempt a percutaneous endoscopically placed gastrostomy tube.  She has no anatomic abnormalities that may preclude this and I think with an endoscope he would be able to adequately insufflate the stomach up to the anterior abdominal wall.  Unfortunately, the operating room does not have these capabilities or I would try to do it in the operating room.  I did discuss with the patient's husband that if GI is unable to have a successful window then we would perform this surgically and would tentatively plan on doing this Friday if they cannot get a good window.  I have reached out to the primary team as well as the gastroenterology team to see if they would consider this.  Baker Pierini, M.D. [facility]

## 2022-12-23 NOTE — H&P (View-Only) (Signed)
GI Inpatient Consult Note  Reason for Consult: PEG Tube Consult    Attending Requesting Consult: Dr. Marrion Coy, MD  History of Present Illness: Christine Hull is a 72 y.o. female seen for evaluation of PEG tube consult at the request of hospitalist - Dr. Marrion Coy. Patient has a PMH of HTN, hypothyroidism, vascular dementia, hx of CVA, chronic back pain, and mood disorder. She was admitted to the hospital 11/22 for chief complaint of  decreased alertness and activity level found to have bilateral subdural hematomas with midline shift and possible subarachnoid hemorrhage s/p bilateral burr hole procedure 11/25. She has been evaluated by speech therapy on multiple occasions and deemed not safe for oral intake. She had NG tube placed for tube feedings on 11/27. She was found to have acalculous cholecystitis on 11/30 and has been treated non-operatively with IV antibiotics. IR was consulted for potential gastrostomy tube placement; however, no safe window was found for fluoro percutaneous access and evidence of overlying transverse colon. GI is consulted today for potential PEG tube placement.   Patient seen and examined this afternoon resting comfortably in hospital bed. Her husband, Christine Hull, is present at bedside. Patient is unable to contribute to the history given her baseline mental status. She is no obvious distress at this time. She is currently receiving tube feeds. She has been unable to feed herself due to dysphagia and dementia.   Past Medical History:  Past Medical History:  Diagnosis Date   Dementia (HCC)    Heart murmur    Hypertension    Hypothyroidism    Medtronic LINQ 05/09/2013   Near syncope 02/02/2013   Prolonged QT interval 02/02/2013   Sinus bradycardia 05/09/2013    Problem List: Patient Active Problem List   Diagnosis Date Noted   Lethargy 12/17/2022   Goals of care, counseling/discussion 12/17/2022   Acalculous cholecystitis 12/17/2022   SDH  (subdural hematoma) (HCC) 12/17/2022   Acute acalculous cholecystitis 12/13/2022   Hyponatremia 12/12/2022   Hypophosphatemia 12/11/2022   Protein-calorie malnutrition, severe 12/09/2022   Traumatic subdural hematoma without loss of consciousness (HCC) 12/04/2022   History of fall 12/04/2022   Acute metabolic encephalopathy 12/04/2022   History of CVA (cerebrovascular accident) 12/04/2022   Traumatic subarachnoid hemorrhage (HCC) 12/04/2022   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) 08/09/2016   Late effect of lacunar infarction 07/19/2016   Hyperlipidemia LDL goal <100 04/28/2016   Alcohol abuse 05/31/2015   Cardiac device in situ 05/09/2013   Sinus bradycardia 05/09/2013   Essential hypertension 04/01/2013   Near syncope 02/02/2013   Headache 02/02/2013   VAD (vascular dementia) (HCC) 02/02/2013   Hypokalemia 02/02/2013   Prolonged QT interval 02/02/2013   Encounter for preventive health examination 11/17/2012   Chronic back pain greater than 3 months duration 08/16/2011   Hypothyroidism 08/14/2011   Tobacco abuse 08/13/2011    Past Surgical History: Past Surgical History:  Procedure Laterality Date   AUGMENTATION MAMMAPLASTY Bilateral 1990's   "had implants put in; had them taken out 6 months later" (02/02/2013)   BREAST BIOPSY Right 2011   Sankar,  normal   BURR HOLE Bilateral 12/07/2022   Procedure: Ezekiel Ina;  Surgeon: Lovenia Kim, MD;  Location: [facility] ORS;  Service: Neurosurgery;  Laterality: Bilateral;  Bilateral Burr holes   IR GASTROSTOMY TUBE MOD SED  12/22/2022   LOOP RECORDER IMPLANT N/A 02/03/2013   Procedure: LOOP RECORDER IMPLANT;  Surgeon: Duke Salvia, MD;  Location: [facility] CATH  LAB;  Service: Cardiovascular;  Laterality: N/A;    Allergies: No Known Allergies  Home Medications: Medications Prior to Admission  Medication Sig Dispense Refill Last Dose   donepezil (ARICEPT) 10 MG tablet Take 10 mg by mouth daily.   12/04/2022    liothyronine (CYTOMEL) 5 MCG tablet TAKE 1 TABLET DAILY (PLEASE CONTACT OFFICE FOR A FOLLOW UP FOR FURTHER REFILLS. LAST REFILL WITHOUT OFFICE VISIT) 90 tablet 3 12/04/2022   liver oil-zinc oxide (DESITIN) 40 % ointment Apply 1 application topically as needed for irritation. 56.7 g 0 prn at unk   nadolol (CORGARD) 20 MG tablet Take 1 tablet by mouth daily.   12/04/2022   sertraline (ZOLOFT) 50 MG tablet TAKE 1 TABLET DAILY 30 tablet 11 12/04/2022   SYNTHROID 50 MCG tablet TAKE 1 TABLET DAILY BEFORE BREAKFAST (PLEASE CONTACT OFFICE FOR A FOLLOW UP FOR FURTHER REFILLS) 90 tablet 0 12/04/2022   triamcinolone cream (KENALOG) 0.5 % APPLY THIN LAYER TOPICALLY TO THE AFFECTED AREA TWICE DAILY   prn at unk   losartan (COZAAR) 100 MG tablet Take 1 tablet (100 mg total) by mouth daily. 30 tablet 0    Home medication reconciliation was completed with the patient.   Scheduled Inpatient Medications:    enoxaparin (LOVENOX) injection  40 mg Subcutaneous Daily   free water  120 mL Per Tube Q4H   levothyroxine  50 mcg Per Tube Q0600   lidocaine  20 mL Intradermal Once   liothyronine  5 mcg Per Tube Daily   sertraline  50 mg Per Tube Daily   sodium chloride flush  3 mL Intravenous Q12H    Continuous Inpatient Infusions:    feeding supplement (OSMOLITE 1.5 CAL) 1,000 mL (12/22/22 1719)    PRN Inpatient Medications:  acetaminophen **OR** acetaminophen, iohexol, morphine injection, mouth rinse, oxyCODONE-acetaminophen  Family History: family history includes Cancer in her maternal aunt; Early death in her father and mother; Stroke in her mother.  [name] history is negative for inflammatory bowel disorders, GI malignancy, or solid organ transplantation.  Social History:   reports that she has been smoking cigarettes. She has a 20.5 pack-year smoking history. She has never used smokeless tobacco. She reports current alcohol use of about 5.0 standard drinks of alcohol per week. She reports  that she does not use drugs. The patient denies ETOH, tobacco, or drug use.   Review of Systems:  Unable to obtain due to patient's baseline mental status    Physical Examination: BP 125/73 (BP Location: Right Arm)   Pulse 97   Temp 99.9 F (37.7 C) (Axillary)   Resp 17   Ht 5\' 5"  (1.651 m)   Wt 56.3 kg   SpO2 99%   BMI 20.65 kg/m  Gen: NAD, alert and oriented x 4 HEENT: PEERLA, EOMI, NG tube in place Neck: supple, no JVD or thyromegaly Chest: CTA bilaterally, no wheezes, crackles, or other adventitious sounds CV: RRR, no m/g/c/r Abd: soft, ND, +BS in all four quadrants; no HSM, guarding, ridigity, or rebound tenderness Ext: no edema, well perfused with 2+ pulses, Skin: no rash or lesions noted Lymph: no LAD  Data: Lab Results  Component Value Date   WBC 9.4 12/22/2022   HGB 8.9 (L) 12/22/2022   HCT 27.5 (L) 12/22/2022   MCV 92.6 12/22/2022   PLT 527 (H) 12/22/2022   Recent Labs  Lab 12/22/22 0419  HGB 8.9*   Lab Results  Component Value Date   NA 138 12/21/2022   K  4.4 12/21/2022   CL 108 12/21/2022   CO2 21 (L) 12/21/2022   BUN 17 12/21/2022   CREATININE 0.69 12/21/2022   Lab Results  Component Value Date   ALT 26 12/12/2022   AST 36 12/12/2022   ALKPHOS 145 (H) 12/12/2022   BILITOT 1.3 (H) 12/12/2022   Recent Labs  Lab 12/22/22 0419  INR 1.2    Assessment/Plan:  72 y/o AA female with a PMH of HTN, hypothyroidism, vascular dementia, mood disorder, hx of CVA, and chronic back pain admitted to [facility] 11/22 for decreased mental status found to have bilateral subdural hematomas s/p bilateral burr hole procedure 11/25. GI consulted this morning for consideration of PEG tube placement.   Dysphagia - 2/2 vascular dementia   Traumatic subdural hematoma/traumatic SAH  Acute acalculous cholecystitis - non-operative management  Acute metabolic encephalopathy  Vascular dementia  Failure to thrive (FTT)  Electrolyte  derangement  Recommendations:  - PEG tube will be planned for tomorrow morning with Dr. Norma Fredrickson - No tube feeds after midnight tonight - See procedure note for findings and further recommendations  I reviewed the risks (including bleeding, perforation, infection, anesthesia complications, cardiac/respiratory complications), benefits and alternatives of PEG tube placement. Patient's husband, Christine Hull, consents to proceed.    Thank you for the consult. Please call with questions or concerns.  Gilda Crease, PA-C [facility] [phone]

## 2022-12-23 NOTE — Progress Notes (Signed)
Physical Therapy Treatment Patient Details Name: Christine Hull MRN: [id number] DOB: May 24, 1950 Today's Date: 12/23/2022   History of Present Illness Pt is a 72 year old female presenting to the ED after a fall, noted with progressive weakness, functional decline.  Admitted for management of bila SDH, s/p bilateral burr holes (11/25).  Hospital course additionally significnat for acalculous cholecystitis.  Plan for PEG placement.   PMH significant for hypothyroidism, hypertension, prior stroke with vascular dementia    PT Comments  Patient resting in R sidelying upon arrival to room, just completing personal care with nursing staff.  Husband remains at bedside.  Patient alert; does track therapist and acknowledge presence, limited purposeful vocalization or command-following (spontaneous, on command) persists. Does tolerate unsupported sitting with close sup this date, improved from previous session.  Limited tolerance for weight shift outside immediate BOS, but beginning to demonstrate some mild balance/righting reactions with posterior sway. Progressed to trial sit/stand with bilat HHA (husband assist), max/total assist +2.  Extensive assist for lift off, stabilization and overall support; unable to tolerate greater than 10 seconds due to fatigue, increased restlessness (and resistant to repeat attempts). Continue to recommend +1-2 assist for all mobility efforts; hoyer lift for any OOB attempts.    If plan is discharge home, recommend the following: Two people to help with bathing/dressing/bathroom;Two people to help with walking and/or transfers   Can travel by private vehicle     No  Equipment Recommendations  BSC/3in1;Wheelchair cushion (measurements PT);Wheelchair (measurements PT);Hoyer lift;Hospital bed    Recommendations for Other Services       Precautions / Restrictions Precautions Precautions: Fall Precaution Comments: NPO/NGT (pending PEG) Restrictions Weight Bearing  Restrictions: No     Mobility  Bed Mobility Overal bed mobility: Needs Assistance Bed Mobility: Supine to Sit Rolling: Max assist         General bed mobility comments: total assist assist to negotiate bilat LEs over edge of bed; mod assist for truncal elevation (facilitated with bilat UEs)    Transfers Overall transfer level: Needs assistance Equipment used: 2 person hand held assist Transfers: Sit to/from Stand Sit to Stand: Max assist, Total assist, +2 physical assistance           General transfer comment: max/total assist for forward weight shift, lift off and standing balance; max faciltiation for bilat hip/knee extension, postural extension.  Tolerates approx 10 second before need to sit down    Ambulation/Gait               General Gait Details: unsafe/unable   Stairs             Wheelchair Mobility     Tilt Bed    Modified Rankin (Stroke Patients Only)       Balance Overall balance assessment: Needs assistance Sitting-balance support: No upper extremity supported, Feet supported Sitting balance-Leahy Scale: Poor Sitting balance - Comments: once upright and accommodated to position, does maintain unsupported sitting balance with close sup this date.  Limited movement outside of immediate BOS; mild righting reactions with posterior sway     Standing balance-Leahy Scale: Zero Standing balance comment: +2 total assist to maintain                            Cognition Arousal: Alert Behavior During Therapy: Flat affect Overall Cognitive Status: History of cognitive impairments - at baseline  General Comments: Some spontanous vocalizations (unintelligble, non-meaningful), largely towards husband rather than therapist.  Does visually track therapist bilat visual fields.  Unable to follow purposeful, isolated commands; intermittent participation with act assist movement and activities  (occasionally resistant).        Exercises Other Exercises Other Exercises: Attempted purposeful, dynamic reaching for objects with bilat UEs in supine and in sitting; unable to initiate, coordinate bilat UE reaching on command.  Does hold objects once placed in bilat UEs, but demonstrates only spontaneous active movement of bilat UEs with functional tasks. Other Exercises: Act assist ROM to bilat UEs/LEs as tolerated, 1x5-6 each extremity; intermittently resistant (LEs > UEs), but tolerating improved ROM from previous session.  Sustained, multi-beat clonus noted to bilat ankles with ROM/stretching this date.    General Comments        Pertinent Vitals/Pain Pain Assessment Pain Assessment: PAINAD Faces Pain Scale: Hurts a little bit Breathing: normal Negative Vocalization: none Facial Expression: smiling or inexpressive Body Language: tense, distressed pacing, fidgeting Consolability: no need to console PAINAD Score: 1 Facial Expression: Relaxed, neutral Body Movements: Absence of movements Muscle Tension: Tense, rigid Compliance with ventilator (intubated pts.): N/A Vocalization (extubated pts.): N/A CPOT Total: 1 Pain Intervention(s): Limited activity within patient's tolerance, Monitored during session, Repositioned    Home Living                          Prior Function            PT Goals (current goals can now be found in the care plan section) Acute Rehab PT Goals Patient Stated Goal: per husband, to trial rehab PT Goal Formulation: With family Potential to Achieve Goals: Fair Progress towards PT goals: Progressing toward goals    Frequency    Min 1X/week      PT Plan      Co-evaluation              AM-PAC PT "6 Clicks" Mobility   Outcome Measure  Help needed turning from your back to your side while in a flat bed without using bedrails?: Total Help needed moving from lying on your back to sitting on the side of a flat bed without  using bedrails?: Total Help needed moving to and from a bed to a chair (including a wheelchair)?: Total Help needed standing up from a chair using your arms (e.g., wheelchair or bedside chair)?: Total Help needed to walk in hospital room?: Total Help needed climbing 3-5 steps with a railing? : Total 6 Click Score: 6    End of Session     Patient left: in bed;with bed alarm set;with family/visitor present;with call bell/phone within reach Nurse Communication: Mobility status PT Visit Diagnosis: Other symptoms and signs involving the nervous system (R29.898)     Time: 4098-1191 PT Time Calculation (min) (ACUTE ONLY): 24 min  Charges:    $Therapeutic Exercise: 8-22 mins $Neuromuscular Re-education: 8-22 mins PT General Charges $$ ACUTE PT VISIT: 1 Visit                    Shannen Flansburg H. Manson Passey, PT, DPT, NCS 12/23/22, 4:14 PM [phone]

## 2022-12-23 NOTE — TOC Progression Note (Signed)
Transition of Care [facility]) - Progression Note    Patient Details  Name: Christine Hull MRN: [id number] Date of Birth: September 07, 1950  Transition of Care [facility]) CM/SW Contact  Allena Katz, LCSW Phone Number: 12/23/2022, 2:45 PM  Clinical Narrative:   [email or web address] list left at bedside for spouse.    Expected Discharge Plan:  (TBD) Barriers to Discharge: Continued Medical Work up  Expected Discharge Plan and Services       Living arrangements for the past 2 months: Single Family Home                                       Social Determinants of Health (SDOH) Interventions SDOH Screenings   Food Insecurity: Patient Unable To Answer (12/06/2022)  Housing: Patient Unable To Answer (12/06/2022)  Transportation Needs: Patient Unable To Answer (12/06/2022)  Utilities: Patient Unable To Answer (12/06/2022)  Tobacco Use: High Risk (12/07/2022)    Readmission Risk Interventions     No data to display

## 2022-12-23 NOTE — Progress Notes (Signed)
Progress Note   Patient: Christine Hull DOB: March 10, 1950 DOA: 12/04/2022     19 DOS: the patient was seen and examined on 12/23/2022   Brief hospital course: 72 year old female with past medical history of hypothyroidism, hypertension, prior stroke with vascular dementia brought into the ED after decreased mental status and not walking as much.  She had a fall around a week ago.  In the ER found to have bilateral subdural hematomas with resultant 4 mm midline shift to the left and possible subarachnoid hemorrhage.  Bilateral burr hole procedure done by neurosurgery on 11/25.   11/26. Decrease in size of bilateral subdural collections 5 mm on the right (was 1.5 cm previously), 6 mm on the left (was previously 1.1 cm).  Resolution of midline shift. 11/27.  Patient is evaluated by speech therapy, still not safe to for oral intake.  Place NG tube start tube feeding. 11/29.  Neurosurgery has removed the drain. 11/30.  Patient developed a high fever, CT abdomen showed acalculous cholecystitis.  Started Zosyn. 12/3.  Leukocytosis essentially resolved, restart tube feeding at two thirds of required rate. Since then, patient has been tolerating tube feeding, antibiotics is completed.  PEG tube is ordered.    Principal Problem:   Traumatic subdural hematoma without loss of consciousness (HCC) Active Problems:   Traumatic subarachnoid hemorrhage (HCC)   Acute metabolic encephalopathy   Hypothyroidism   VAD (vascular dementia) (HCC)   Hypokalemia   Essential hypertension   Sinus bradycardia   History of fall   History of CVA (cerebrovascular accident)   Protein-calorie malnutrition, severe   Hypophosphatemia   Hyponatremia   Acute acalculous cholecystitis   Lethargy   Goals of care, counseling/discussion   Acalculous cholecystitis   SDH (subdural hematoma) (HCC)   Assessment and Plan: Acute acalculous cholecystitis. Severe sepsis. Patient developed significant fever  and tachycardia, hypotension 12/1. CT abdomen pelvis showed acute acalculous cholecystitis 11/30.   General surgery consulted and did not recommend surgical intervention. Patient was started on Zosyn. Continues to remain stable at this point.  - transition to augmentin for total 10 day Abx course. To complete 12/10. Patient condition has improved, currently tolerating tube feeding.     Traumatic subdural hematoma  Traumatic subarachnoid hemorrhage History of fall with head trauma 2 weeks POA Imaging showed bilateral subdural hematomas with resultant 4 mm midline shift to the left and possible subarachnoid hemorrhage Neurosurgery consulted and performed bilateral burr hole procedure 11/25.  Repeat head imaging post-procedure 11/26, 12/1 showed stable condition. Bilateral drains removed 11/29.  Her mental status appears to be stable and not improving significantly since admission and >4 weeks from initial injury. I predict that further significant improvement is not likely at this time. She was previously non-verbal at baseline.  staples removed 12/9, neurosurgery signed off,  prophylactic keppra completed Due to poor prognosis, palliative care is following. Seen by PT/OT, recommend nursing home placement.  TOC is working on placement.   Dysphagia- severe. Evaluated several times by speech therapy and she is unable to take anything safely by mouth and unlikely to recover this function.  - continue NG tube. PEG placement not attempted by IR after evaluation  Spoke with GI and general surgery, GI consult obtained to place PEG tube.    Acute metabolic encephalopathy VAD (vascular dementia) (HCC)  History of stroke. Failure to thrive. Patient has severe dementia at baseline, she is nonverbal.  She has a worsening mental status since admission and appears to have stabilized at  new baseline. Long-term prognosis very poor, palliative care consult obtained.      Hypokalemia. Hypophosphatemia. Hyponatremia. Conditions are resolved, continue monitoring and reevaluate as transitioning to PEG feedings.  - registered dietician consulted    Hypoglycemia secondary to poor p.o. intake. - continue tube feeding.   Essential hypertension Discontinued losartan due to low blood pressure.   Hypothyroidism Continue levothyroxine          Subjective:  Patient is nonverbal with some confusion.  Physical Exam: Vitals:   12/22/22 1550 12/22/22 1937 12/23/22 0324 12/23/22 0729  BP: 102/82 105/81 125/83 125/73  Pulse: 95 90 95 97  Resp: 19 18  17   Temp: 98.1 F (36.7 C) 99 F (37.2 C) 99.4 F (37.4 C) 99.9 F (37.7 C)  TempSrc: Axillary   Axillary  SpO2: 97% 98% 97% 99%  Weight:      Height:       General exam: Appears calm and comfortable  Respiratory system: Clear to auscultation. Respiratory effort normal. Cardiovascular system: S1 & S2 heard, RRR. No JVD, murmurs, rubs, gallops or clicks. No pedal edema. Gastrointestinal system: Abdomen is nondistended, soft and nontender. No organomegaly or masses felt. Normal bowel sounds heard. Central nervous system: Alert and nonverbal. Extremities: Symmetric 5 x 5 power. Skin: No rashes, lesions or ulcers Psychiatry: Flat affect.   Data Reviewed:  Lab results reviewed.  Family Communication: Husband updated at bedside.  Disposition: Status is: Inpatient Remains inpatient appropriate because: Severity of disease, inpatient procedure.     Time spent: 35 minutes  Author: Marrion Coy, MD 12/23/2022 10:31 AM  For on call review [email or web address].

## 2022-12-23 NOTE — Plan of Care (Signed)

## 2022-12-24 ENCOUNTER — Inpatient Hospital Stay: Payer: Medicare Other | Admitting: General Practice

## 2022-12-24 ENCOUNTER — Encounter
Admission: EM | Disposition: A | Discharge status: Skilled Nursing Facility | Payer: Self-pay | Source: Home / Self Care | Attending: Internal Medicine

## 2022-12-24 DIAGNOSIS — Z8673 Personal history of transient ischemic attack (TIA), and cerebral infarction without residual deficits: Secondary | ICD-10-CM | POA: Diagnosis not present

## 2022-12-24 DIAGNOSIS — S065X0D Traumatic subdural hemorrhage without loss of consciousness, subsequent encounter: Secondary | ICD-10-CM | POA: Diagnosis not present

## 2022-12-24 DIAGNOSIS — K810 Acute cholecystitis: Secondary | ICD-10-CM | POA: Diagnosis not present

## 2022-12-24 HISTORY — PX: PEG PLACEMENT: SHX5437

## 2022-12-24 LAB — GLUCOSE, CAPILLARY
Glucose-Capillary: 110 mg/dL — ABNORMAL HIGH (ref 70–99)
Glucose-Capillary: 102 mg/dL — ABNORMAL HIGH (ref 70–99)
Glucose-Capillary: 105 mg/dL — ABNORMAL HIGH (ref 70–99)
Glucose-Capillary: 97 mg/dL (ref 70–99)
Glucose-Capillary: 110 mg/dL — ABNORMAL HIGH (ref 70–99)

## 2022-12-24 SURGERY — INSERTION, PEG TUBE
Anesthesia: General

## 2022-12-24 MED ORDER — GLYCOPYRROLATE 0.2 MG/ML IJ SOLN
INTRAMUSCULAR | Status: DC | PRN
  Administered 2022-12-24: .2 mg via INTRAVENOUS

## 2022-12-24 MED ORDER — OSMOLITE 1.2 CAL PO LIQD
1000.0000 mL | ORAL | Status: DC
  Administered 2022-12-24: 1000 mL

## 2022-12-24 MED ORDER — ONDANSETRON HCL 4 MG/2ML IJ SOLN
INTRAMUSCULAR | Status: DC | PRN
  Administered 2022-12-24: 4 mg via INTRAVENOUS

## 2022-12-24 MED ORDER — VASOPRESSIN 20 UNIT/ML IV SOLN
INTRAVENOUS | Status: DC | PRN
  Administered 2022-12-24 (×2): 4 [IU] via INTRAVENOUS

## 2022-12-24 MED ORDER — DEXAMETHASONE SODIUM PHOSPHATE 10 MG/ML IJ SOLN
INTRAMUSCULAR | Status: DC | PRN
  Administered 2022-12-24: 20 mg via INTRAVENOUS

## 2022-12-24 MED ORDER — LIDOCAINE HCL (CARDIAC) PF 100 MG/5ML IV SOSY
PREFILLED_SYRINGE | INTRAVENOUS | Status: DC | PRN
  Administered 2022-12-24: 20 mg via INTRAVENOUS

## 2022-12-24 MED ORDER — EPHEDRINE SULFATE-NACL 50-0.9 MG/10ML-% IV SOSY
PREFILLED_SYRINGE | INTRAVENOUS | Status: DC | PRN
  Administered 2022-12-24: 5 mg via INTRAVENOUS

## 2022-12-24 MED ORDER — PHENYLEPHRINE 80 MCG/ML (10ML) SYRINGE FOR IV PUSH (FOR BLOOD PRESSURE SUPPORT)
PREFILLED_SYRINGE | INTRAVENOUS | Status: DC | PRN
  Administered 2022-12-24 (×3): 160 ug via INTRAVENOUS
  Administered 2022-12-24: 80 ug via INTRAVENOUS
  Administered 2022-12-24 (×2): 160 ug via INTRAVENOUS

## 2022-12-24 MED ORDER — CEFAZOLIN SODIUM-DEXTROSE 2-4 GM/100ML-% IV SOLN: 2.0000 g | Freq: Once | INTRAVENOUS | Status: DC

## 2022-12-24 MED ORDER — PROPOFOL 10 MG/ML IV BOLUS
INTRAVENOUS | Status: DC | PRN
  Administered 2022-12-24: 50 mg via INTRAVENOUS

## 2022-12-24 MED ORDER — LACTATED RINGERS IV SOLN: INTRAVENOUS | Status: DC | PRN

## 2022-12-24 MED ORDER — SODIUM CHLORIDE 0.9 % IV SOLN: INTRAVENOUS | Status: DC

## 2022-12-24 MED ORDER — ROCURONIUM BROMIDE 100 MG/10ML IV SOLN
INTRAVENOUS | Status: DC | PRN
  Administered 2022-12-24 (×2): 20 mg via INTRAVENOUS

## 2022-12-24 MED ORDER — SUGAMMADEX SODIUM 200 MG/2ML IV SOLN
INTRAVENOUS | Status: DC | PRN
  Administered 2022-12-24: 200 mg via INTRAVENOUS

## 2022-12-24 MED ORDER — CEFAZOLIN SODIUM-DEXTROSE 2-3 GM-%(50ML) IV SOLR
INTRAVENOUS | Status: DC | PRN
  Administered 2022-12-24: 2 g via INTRAVENOUS

## 2022-12-24 NOTE — Anesthesia Postprocedure Evaluation (Signed)
Anesthesia Post Note  Patient: Christine Hull  Procedure(s) Performed: PERCUTANEOUS ENDOSCOPIC GASTROSTOMY (PEG) PLACEMENT  Patient location during evaluation: PACU Anesthesia Type: General Level of consciousness: awake and alert, oriented and patient cooperative Pain management: pain level controlled Vital Signs Assessment: post-procedure vital signs reviewed and stable Respiratory status: spontaneous breathing, nonlabored ventilation and respiratory function stable Cardiovascular status: blood pressure returned to baseline and stable Postop Assessment: adequate PO intake Anesthetic complications: no   No notable events documented.   Last Vitals:  Vitals:   12/24/22 1628 12/24/22 1638  BP: 98/82 98/84  Pulse: (!) 107 100  Resp:    Temp:    SpO2: 92% 92%    Last Pain:  Vitals:   12/24/22 1605  TempSrc: Temporal  PainSc: 0-No pain                 Reed Breech

## 2022-12-24 NOTE — TOC Progression Note (Signed)
Transition of Care [facility]) - Progression Note    Patient Details  Name: Christine Hull MRN: [id number] Date of Birth: 03-09-1950  [facility]) CM/SW Contact  Allena Katz, LCSW Phone Number: 12/24/2022, 2:06 PM  Clinical Narrative:   CSW shared bed offers with spouse he will review those and get back to me.    Expected Discharge Plan:  (TBD) Barriers to Discharge: Continued Medical Work up  Expected Discharge Plan and Services       Living arrangements for the past 2 months: Single Family Home                                       Social Determinants of Health (SDOH) Interventions SDOH Screenings   Food Insecurity: Patient Unable To Answer (12/06/2022)  Housing: Patient Unable To Answer (12/06/2022)  Transportation Needs: Patient Unable To Answer (12/06/2022)  Utilities: Patient Unable To Answer (12/06/2022)  Tobacco Use: High Risk (12/07/2022)    Readmission Risk Interventions     No data to display

## 2022-12-24 NOTE — Plan of Care (Signed)
  Problem: Education: Goal: Knowledge of condition and prescribed therapy will improve Outcome: Not Progressing   Problem: Cardiac: Goal: Will achieve and/or maintain adequate cardiac output Outcome: Progressing   Problem: Physical Regulation: Goal: Complications related to the disease process, condition or treatment will be avoided or minimized Outcome: Progressing   Problem: Health Behavior/Discharge Planning: Goal: Ability to manage health-related needs will improve Outcome: Not Progressing   Problem: Clinical Measurements: Goal: Ability to maintain clinical measurements within normal limits will improve Outcome: Progressing Goal: Will remain free from infection Outcome: Progressing Goal: Diagnostic test results will improve Outcome: Progressing Goal: Respiratory complications will improve Outcome: Progressing Goal: Cardiovascular complication will be avoided Outcome: Progressing   Problem: Activity: Goal: Risk for activity intolerance will decrease Outcome: Progressing   Problem: Nutrition: Goal: Adequate nutrition will be maintained Outcome: Not Progressing   Problem: Coping: Goal: Level of anxiety will decrease Outcome: Progressing   Problem: Elimination: Goal: Will not experience complications related to bowel motility Outcome: Progressing Goal: Will not experience complications related to urinary retention Outcome: Progressing   Problem: Pain Management: Goal: General experience of comfort will improve Outcome: Progressing   Problem: Safety: Goal: Ability to remain free from injury will improve Outcome: Progressing   Problem: Skin Integrity: Goal: Risk for impaired skin integrity will decrease Outcome: Progressing

## 2022-12-24 NOTE — Transfer of Care (Signed)
Immediate Anesthesia Transfer of Care Note  Patient: Christine Hull  Procedure(s) Performed: PERCUTANEOUS ENDOSCOPIC GASTROSTOMY (PEG) PLACEMENT  Patient Location: Endoscopy Unit  Anesthesia Type:General  Level of Consciousness: drowsy and patient cooperative  Airway & Oxygen Therapy: Patient Spontanous Breathing and Patient connected to face mask oxygen  Post-op Assessment: Report given to RN and Post -op Vital signs reviewed and stable  Post vital signs: Reviewed and stable  Last Vitals:  Vitals Value Taken Time  BP 84/73 recheck 108/70 12/24/22 1605  Temp 35.6 C 12/24/22 1605  Pulse 96 12/24/22 1605  Resp    SpO2      Last Pain:  Vitals:   12/24/22 1605  TempSrc: Temporal  PainSc: 0-No pain         Complications: No notable events documented.

## 2022-12-24 NOTE — Op Note (Signed)
[facility] Gastroenterology Patient Name: Christine Hull Procedure Date: 12/24/2022 3:15 PM MRN: [id number] Account #: [id number] Date of Birth: 1950/07/15 Admit Type: Inpatient Age: 72 Room: [facility] ENDO ROOM 1 Gender: Female Note Status: Finalized Instrument Name: Upper Endoscope [id number] Procedure:             Upper GI endoscopy Indications:           Place PEG, Feeding problem, PEG tube requested by                         referring provider Providers:             Boykin Nearing. Norma Fredrickson MD, MD Referring MD:          No Local Md, MD (Referring MD) Medicines:             General Anesthesia Complications:         No immediate complications. Estimated blood loss:                         Minimal. Procedure:             Pre-Anesthesia Assessment:                        - The risks and benefits of the procedure and the                         sedation options and risks were discussed with the                         patient. All questions were answered and informed                         consent was obtained.                        - Patient identification and proposed procedure were                         verified prior to the procedure by the nurse. The                         procedure was verified in the procedure room.                        - ASA Grade Assessment: III - A patient with severe                         systemic disease.                        - After reviewing the risks and benefits, the patient                         was deemed in satisfactory condition to undergo the                         procedure.                        After obtaining informed  consent, the endoscope was                         passed under direct vision. Throughout the procedure,                         the patient's blood pressure, pulse, and oxygen                         saturations were monitored continuously. The Endoscope                         was introduced through  the mouth, and advanced to the                         third part of duodenum. The upper GI endoscopy was                         accomplished without difficulty. The patient tolerated                         the procedure well. The upper GI endoscopy was                         accomplished without difficulty. Findings:      The esophagus was normal.      The examined duodenum was normal.      The entire examined stomach was normal. The patient was placed in the       supine position for PEG placement. The stomach was insufflated to appose       gastric and abdominal walls. A site was located in the body of the       stomach with excellent transillumination for placement. The abdominal       wall was marked and prepped in a sterile manner. The area was       anesthetized with 5 mL of 1% lidocaine. The trocar needle was introduced       through the abdominal wall and into the stomach under direct endoscopic       view. A snare was introduced through the endoscope and opened in the       gastric lumen. The guide wire was passed through the trocar and into the       open snare. The snare was closed around the guide wire. The endoscope       and snare were removed, pulling the wire out through the mouth. A skin       incision was made at the site of needle insertion. The externally       removable 20 Fr EndoVive low-profile gastrostomy tube was lubricated.       The G-tube was tied to the guide wire and pulled through the mouth and       into the stomach. The trocar needle was removed, and the gastrostomy       tube was pulled out from the stomach through the skin. The external       bumper was attached to the gastrostomy tube, and the tube was cut to       remove the guide wire. The final position of the gastrostomy tube was       confirmed by relook endoscopy,  and skin marking noted to be 2.5 cm at       the external bumper. The final tension and compression of the abdominal       wall by  the PEG tube and external bumper were checked and revealed that       the bumper was loose and lightly touching the skin. The feeding tube was       capped, and the tube site cleaned and dressed. Estimated blood loss was       minimal. Impression:            - Normal esophagus.                        - Normal examined duodenum.                        - Normal stomach.                        - An externally removable PEG placement was                         successfully completed.                        - No specimens collected. Recommendation:        - Return patient to hospital ward for ongoing care.                        - see POST PEG orders.                        Apply abdominal binder.                        Wait 4 hours to use PEG for feedings.May flush and                         give meds immediately through PEG. Procedure Code(s):     --- Professional ---                        [phone], Esophagogastroduodenoscopy, flexible,                         transoral; with directed placement of percutaneous                         gastrostomy tube Diagnosis Code(s):     --- Professional ---                        Z43.1, Encounter for attention to gastrostomy CPT copyright 2022 [employer]. All rights reserved. The codes documented in this report are preliminary and upon coder review may  be revised to meet current compliance requirements. [name] MD, MD 12/24/2022 4:29:18 PM This report has been signed electronically. Number of Addenda: 0 Note Initiated On: 12/24/2022 3:15 PM Estimated Blood Loss:  Estimated blood loss was minimal.      [facility]

## 2022-12-24 NOTE — Progress Notes (Signed)
Progress Note   Patient: Christine Hull ZOX:[id number] DOB: 1950-02-20 DOA: 12/04/2022     20 DOS: the patient was seen and examined on 12/24/2022   Brief hospital course: 72 year old female with past medical history of hypothyroidism, hypertension, prior stroke with vascular dementia brought into the ED after decreased mental status and not walking as much.  She had a fall around a week ago.  In the ER found to have bilateral subdural hematomas with resultant 4 mm midline shift to the left and possible subarachnoid hemorrhage.  Bilateral burr hole procedure done by neurosurgery on 11/25.   11/26. Decrease in size of bilateral subdural collections 5 mm on the right (was 1.5 cm previously), 6 mm on the left (was previously 1.1 cm).  Resolution of midline shift. 11/27.  Patient is evaluated by speech therapy, still not safe to for oral intake.  Place NG tube start tube feeding. 11/29.  Neurosurgery has removed the drain. 11/30.  Patient developed a high fever, CT abdomen showed acalculous cholecystitis.  Started Zosyn. 12/3.  Leukocytosis essentially resolved, restart tube feeding at two thirds of required rate. Since then, patient has been tolerating tube feeding, antibiotics is completed.  PEG tube is placed by GI today.    Principal Problem:   Traumatic subdural hematoma without loss of consciousness (HCC) Active Problems:   Traumatic subarachnoid hemorrhage (HCC)   Acute metabolic encephalopathy   Hypothyroidism   VAD (vascular dementia) (HCC)   Hypokalemia   Essential hypertension   Sinus bradycardia   History of fall   History of CVA (cerebrovascular accident)   Protein-calorie malnutrition, severe   Hypophosphatemia   Hyponatremia   Acute acalculous cholecystitis   Lethargy   Goals of care, counseling/discussion   Acalculous cholecystitis   SDH (subdural hematoma) (HCC)   Assessment and Plan:   Acute acalculous cholecystitis. Severe sepsis. Patient developed  significant fever and tachycardia, hypotension 12/1. CT abdomen pelvis showed acute acalculous cholecystitis 11/30.   General surgery consulted and did not recommend surgical intervention. Patient was started on Zosyn. Continues to remain stable at this point.  - transition to augmentin for total 10 day Abx course.  Condition has improved, antibiotic completed.   Traumatic subdural hematoma  Traumatic subarachnoid hemorrhage History of fall with head trauma 2 weeks POA Imaging showed bilateral subdural hematomas with resultant 4 mm midline shift to the left and possible subarachnoid hemorrhage Neurosurgery consulted and performed bilateral burr hole procedure 11/25.  Repeat head imaging post-procedure 11/26, 12/1 showed stable condition. Bilateral drains removed 11/29.  Her mental status appears to be stable and not improving significantly since admission and >4 weeks from initial injury. I predict that further significant improvement is not likely at this time. She was previously non-verbal at baseline.  staples removed 12/9, neurosurgery signed off,  prophylactic keppra completed Due to poor prognosis, palliative care is following. Seen by PT/OT, recommend nursing home placement.  TOC is working on placement.   Dysphagia- severe. Evaluated several times by speech therapy and she is unable to take anything safely by mouth and unlikely to recover this function.  - continue NG tube. PEG placement not attempted by IR after evaluation  GI has placed the PEG tube today.    Acute metabolic encephalopathy VAD (vascular dementia) (HCC)  History of stroke. Failure to thrive. Patient has severe dementia at baseline, she is nonverbal.  She has a worsening mental status since admission and appears to have stabilized at new baseline. Long-term prognosis very poor, palliative care  consult obtained.      Hypokalemia. Hypophosphatemia. Hyponatremia. Conditions are resolved, continue monitoring and  reevaluate as transitioning to PEG feedings.  Recheck electrolytes tomorrow.    Hypoglycemia secondary to poor p.o. intake. - continue tube feeding.   Essential hypertension Discontinued losartan due to low blood pressure.   Hypothyroidism Continue levothyroxine    Subjective:  Patient doing well today, nonverbal.  Physical Exam: Vitals:   12/23/22 1509 12/23/22 2044 12/24/22 0238 12/24/22 0732  BP: 113/73 97/73 100/63 (!) 110/99  Pulse: 98 92 86 91  Resp: 19 18 18 16   Temp: 99.5 F (37.5 C) 98.9 F (37.2 C) 99 F (37.2 C) 98.6 F (37 C)  TempSrc: Axillary  Oral   SpO2: 99% 100% 100% 97%  Weight:      Height:       General exam: Appears calm and comfortable  Respiratory system: Clear to auscultation. Respiratory effort normal. Cardiovascular system: S1 & S2 heard, RRR. No JVD, murmurs, rubs, gallops or clicks. No pedal edema. Gastrointestinal system: Abdomen is nondistended, soft and nontender. No organomegaly or masses felt. Normal bowel sounds heard. Central nervous system: Alert and nonverbal. No focal neurological deficits. Extremities: Symmetric 5 x 5 power. Skin: No rashes, lesions or ulcers Psychiatry: Flat affect.   Data Reviewed:  Lab results reviewed,  Family Communication: Husband updated at the bedside.  Disposition: Status is: Inpatient Remains inpatient appropriate because: Severity of disease, inpatient procedure.     Time spent: 35 minutes  Author: Marrion Coy, MD 12/24/2022 12:51 PM  For on call review [email or web address].

## 2022-12-24 NOTE — Progress Notes (Signed)
Occupational Therapy Treatment Patient Details Name: Christine Hull MRN: [id number] DOB: [birth year] Today's Date: 12/24/2022   History of present illness Pt is a 72 year old female presenting to the ED after a fall, noted with progressive weakness, functional decline.  Admitted for management of bila SDH, s/p bilateral burr holes (11/25).  Hospital course additionally significnat for acalculous cholecystitis.  Plan for PEG placement today 12/12.   PMH significant for hypothyroidism, hypertension, prior stroke with vascular dementia   OT comments  Pt is supine in bed on arrival. Eyes open and able to track therapists, mumbles, but unintelligible. Husband present in room and engaged in session. Pt requiring Max/Total assist for supine<>sit at EOB. Able to maintain seated balance with close SUP once in proper upright position and noted to touch head and mouth with LUE. Unable to follow directions. Max/total assist x2 via HHA for STS at EOB x45 seconds with RLE advancement and backing up to bed with narrow BOS noted. Max/total assist x2 to scoot to [facility] and reposition in bed. NT came in to assist with changing pt requiring Max A to roll to bil sides. Pt returned to bed with all needs in place and will cont to require skilled acute OT services to maximize her strength and abilities.       If plan is discharge home, recommend the following:  Two people to help with walking and/or transfers;Two people to help with bathing/dressing/bathroom;Direct supervision/assist for medications management;Supervision due to cognitive status;Direct supervision/assist for financial management;Assist for transportation;Assistance with cooking/housework;Help with stairs or ramp for entrance   Equipment Recommendations  Other (comment) (defer)    Recommendations for Other Services      Precautions / Restrictions Precautions Precautions: Fall Precaution Comments: NPO/NGT (pending PEG) Restrictions Weight Bearing  Restrictions Per Provider Order: No       Mobility Bed Mobility Overal bed mobility: Needs Assistance Bed Mobility: Supine to Sit, Sit to Supine Rolling: Max assist   Supine to sit: Max assist Sit to supine: Max assist   General bed mobility comments: Max A overall with total provided for BLEs to EOB and MAX/MOD A for trunk using chuck pads    Transfers Overall transfer level: Needs assistance Equipment used: 2 person hand held assist Transfers: Sit to/from Stand Sit to Stand: Max assist, Total assist, +2 physical assistance           General transfer comment: max/total assist x2 via HHA to facilitate all aspects of standing with knee flexion noted, pt did tolerate x45 seconds standing with ability to advance RLE and bring it back to bed however very narrow BOS     Balance Overall balance assessment: Needs assistance Sitting-balance support: No upper extremity supported, Feet supported Sitting balance-Leahy Scale: Poor Sitting balance - Comments: once upright and properly positioned EOB, able to maintain unsupported sitting balance with close sup again on this date, able to lift LUE to touch head and mouth otherwise no movements outside of BOS     Standing balance-Leahy Scale: Zero Standing balance comment: +2 total assist to maintain                           ADL either performed or assessed with clinical judgement   ADL Overall ADL's : Needs assistance/impaired Eating/Feeding: NPO           Lower Body Bathing: Maximal assistance;Total assistance;Bed level  Extremity/Trunk Assessment Upper Extremity Assessment Upper Extremity Assessment: Generalized weakness (spontaneous movements throughout session, resistive to certain movements when therapist attempting to move arm/leg)   Lower Extremity Assessment Lower Extremity Assessment: Generalized weakness        Vision   Additional Comments: tracking has  improved some, still unable to follow purposefully   Perception     Praxis      Cognition Arousal: Alert Behavior During Therapy: Flat affect Overall Cognitive Status: History of cognitive impairments - at baseline Area of Impairment: Orientation, Attention, Memory, Following commands, Safety/judgement, Awareness                               General Comments: Some spontanous vocalizations (unintelligble, non-meaningful), largely towards husband rather than therapist.  Does visually track therapist bilat visual fields.  Unable to follow purposeful, isolated commands; intermittent participation with act assist movement and activities (occasionally resistant).        Exercises      Shoulder Instructions       General Comments      Pertinent Vitals/ Pain       Pain Assessment Pain Assessment: PAINAD Faces Pain Scale: Hurts a little bit Facial Expression: Relaxed, neutral Body Movements: Absence of movements Muscle Tension: Tense, rigid Compliance with ventilator (intubated pts.): N/A Vocalization (extubated pts.): N/A CPOT Total: 1 Pain Location: buttocks during cleaning Pain Intervention(s): Limited activity within patient's tolerance, Monitored during session  Home Living                                          Prior Functioning/Environment              Frequency  Min 1X/week        Progress Toward Goals  OT Goals(current goals can now be found in the care plan section)  Progress towards OT goals: Progressing toward goals  Acute Rehab OT Goals OT Goal Formulation: With family  Plan      Co-evaluation                 AM-PAC OT "6 Clicks" Daily Activity     Outcome Measure   Help from another person eating meals?: Total Help from another person taking care of personal grooming?: Total Help from another person toileting, which includes using toliet, bedpan, or urinal?: Total Help from another person bathing  (including washing, rinsing, drying)?: Total Help from another person to put on and taking off regular upper body clothing?: Total Help from another person to put on and taking off regular lower body clothing?: Total 6 Click Score: 6    End of Session    OT Visit Diagnosis: Other abnormalities of gait and mobility (R26.89)   Activity Tolerance Patient tolerated treatment well   Patient Left in bed;with call bell/phone within reach;with bed alarm set;with family/visitor present   Nurse Communication          Time: 0981-1914 OT Time Calculation (min): 29 min  Charges: OT General Charges $OT Visit: 1 Visit OT Treatments $Therapeutic Activity: 23-37 mins  Xandrea Clarey, OTR/L  12/24/22, 1:43 PM   Ariba Lehnen E Mitchelle Sultan 12/24/2022, 1:41 PM

## 2022-12-24 NOTE — Anesthesia Procedure Notes (Signed)
Procedure Name: Intubation Date/Time: 12/24/2022 3:29 PM  Performed by: Mohammed Kindle, CRNAPre-anesthesia Checklist: Patient identified, Emergency Drugs available, Suction available and Patient being monitored Patient Re-evaluated:Patient Re-evaluated prior to induction Oxygen Delivery Method: Circle system utilized Preoxygenation: Pre-oxygenation with 100% oxygen Induction Type: IV induction and Cricoid Pressure applied Ventilation: Mask ventilation without difficulty Laryngoscope Size: McGrath and 3 Grade View: Grade II Tube type: Oral Number of attempts: 2 Airway Equipment and Method: Stylet and Oral airway Placement Confirmation: ETT inserted through vocal cords under direct vision, positive ETCO2 and breath sounds checked- equal and bilateral Secured at: 21 cm Tube secured with: Tape Dental Injury: Teeth and Oropharynx as per pre-operative assessment  Comments: Og tube in place, limiting view, pt intubated w/o incident, atraumatic- TFH CRNA

## 2022-12-24 NOTE — Progress Notes (Signed)
Patient returned to unit from endoscopy following PEG tube placement in stable condition.  Abdominal incision covered with gauze, clean, dry and intact.  Abdominal binder donned on patient to maintain PEG integrity.

## 2022-12-24 NOTE — Interval H&P Note (Signed)
History and Physical Interval Note:  12/24/2022 3:16 PM  Christine Hull  has presented today for surgery, with the diagnosis of Severe dysphagia.  The various methods of treatment have been discussed with the patient and family. After consideration of risks, benefits and other options for treatment, the patient has consented to  Procedure(s): PERCUTANEOUS ENDOSCOPIC GASTROSTOMY (PEG) PLACEMENT (N/A) as a surgical intervention.  The patient's history has been reviewed, patient examined, no change in status, stable for surgery.  I have reviewed the patient's chart and labs.  Questions were answered to the patient's satisfaction.     [place], [place]

## 2022-12-24 NOTE — Progress Notes (Signed)
The patient has been NPO since midnight, and tube feeding was stopped at that time in preparation for a potential procedure later today.

## 2022-12-24 NOTE — Anesthesia Preprocedure Evaluation (Signed)
Anesthesia Evaluation  Patient identified by MRN, date of birth, ID band Patient confused    Reviewed: Allergy & Precautions, H&P , NPO status , Patient's Chart, lab work & pertinent test results, reviewed documented beta blocker date and time   Airway Mallampati: II  TM Distance: >3 FB Neck ROM: full    Dental  (+) Teeth Intact, Dental Advidsory Given   Pulmonary neg pulmonary ROS, Current Smoker   Pulmonary exam normal        Cardiovascular Exercise Tolerance: Poor hypertension, On Medications Normal cardiovascular exam+ Valvular Problems/Murmurs  Rhythm:regular Rate:Normal     Neuro/Psych  Headaches PSYCHIATRIC DISORDERS     Dementia    GI/Hepatic negative GI ROS, Neg liver ROS,,,  Endo/Other  Hypothyroidism    Renal/GU      Musculoskeletal   Abdominal   Peds  Hematology negative hematology ROS (+)   Anesthesia Other Findings Past Medical History: No date: Dementia (HCC) No date: Heart murmur No date: Hypertension No date: Hypothyroidism 05/09/2013: Medtronic LINQ 02/02/2013: Near syncope 02/02/2013: Prolonged QT interval 05/09/2013: Sinus bradycardia Past Surgical History: 1990's: AUGMENTATION MAMMAPLASTY; Bilateral     Comment:  "had implants put in; had them taken out 6 months later"              (02/02/2013) 2011: BREAST BIOPSY; Right     Comment:  Evette Cristal,  normal 02/03/2013: LOOP RECORDER IMPLANT; N/A     Comment:  Procedure: LOOP RECORDER IMPLANT;  Surgeon: Duke Salvia, MD;  Location: [facility] CATH LAB;  Service:               Cardiovascular;  Laterality: N/A; BMI    Body Mass Index: 20.47 kg/m     Reproductive/Obstetrics negative OB ROS                              Anesthesia Physical Anesthesia Plan  ASA: 3  Anesthesia Plan: General   Post-op Pain Management: Minimal or no pain anticipated   Induction: Intravenous  PONV Risk Score and Plan: 3 and  Propofol infusion, TIVA and Ondansetron  Airway Management Planned: Nasal Cannula  Additional Equipment: None  Intra-op Plan:   Post-operative Plan:   Informed Consent: I have reviewed the patients History and Physical, chart, labs and discussed the procedure including the risks, benefits and alternatives for the proposed anesthesia with the patient or authorized representative who has indicated his/her understanding and acceptance.     Dental advisory given  Plan Discussed with: CRNA and Surgeon  Anesthesia Plan Comments: (Discussed risks of anesthesia with patient's husband, including possibility of difficulty with spontaneous ventilation under anesthesia necessitating airway intervention, PONV, and rare risks such as cardiac or respiratory or neurological events, and allergic reactions. Discussed the role of CRNA in patient's perioperative care. Patient's husband understands.)         Anesthesia Quick Evaluation

## 2022-12-25 ENCOUNTER — Encounter: Payer: Self-pay | Admitting: Internal Medicine

## 2022-12-25 DIAGNOSIS — Z8673 Personal history of transient ischemic attack (TIA), and cerebral infarction without residual deficits: Secondary | ICD-10-CM | POA: Diagnosis not present

## 2022-12-25 DIAGNOSIS — K810 Acute cholecystitis: Secondary | ICD-10-CM | POA: Diagnosis not present

## 2022-12-25 DIAGNOSIS — S066X0A Traumatic subarachnoid hemorrhage without loss of consciousness, initial encounter: Secondary | ICD-10-CM | POA: Diagnosis not present

## 2022-12-25 LAB — GLUCOSE, CAPILLARY
Glucose-Capillary: 101 mg/dL — ABNORMAL HIGH (ref 70–99)
Glucose-Capillary: 105 mg/dL — ABNORMAL HIGH (ref 70–99)
Glucose-Capillary: 108 mg/dL — ABNORMAL HIGH (ref 70–99)
Glucose-Capillary: 131 mg/dL — ABNORMAL HIGH (ref 70–99)
Glucose-Capillary: 138 mg/dL — ABNORMAL HIGH (ref 70–99)
Glucose-Capillary: 98 mg/dL (ref 70–99)

## 2022-12-25 LAB — PHOSPHORUS: Phosphorus: 2.9 mg/dL (ref 2.5–4.6)

## 2022-12-25 LAB — BASIC METABOLIC PANEL
Anion gap: 6 (ref 5–15)
BUN: 20 mg/dL (ref 8–23)
CO2: 23 mmol/L (ref 22–32)
Calcium: 8.5 mg/dL — ABNORMAL LOW (ref 8.9–10.3)
Chloride: 108 mmol/L (ref 98–111)
Creatinine, Ser: 0.62 mg/dL (ref 0.44–1.00)
GFR, Estimated: >60 mL/min (ref >60)
Glucose, Bld: 130 mg/dL — ABNORMAL HIGH (ref 70–99)
Potassium: 4.3 mmol/L (ref 3.5–5.1)
Sodium: 137 mmol/L (ref 135–145)

## 2022-12-25 LAB — MAGNESIUM: Magnesium: 2 mg/dL (ref 1.7–2.4)

## 2022-12-25 NOTE — Progress Notes (Signed)
Progress Note   Patient: Christine Hull FAO:[id number] DOB: 1950-07-11 DOA: 12/04/2022     21 DOS: the patient was seen and examined on 12/25/2022   Brief hospital course: 72 year old female with past medical history of hypothyroidism, hypertension, prior stroke with vascular dementia brought into the ED after decreased mental status and not walking as much.  She had a fall around a week ago.  In the ER found to have bilateral subdural hematomas with resultant 4 mm midline shift to the left and possible subarachnoid hemorrhage.  Bilateral burr hole procedure done by neurosurgery on 11/25.   11/26. Decrease in size of bilateral subdural collections 5 mm on the right (was 1.5 cm previously), 6 mm on the left (was previously 1.1 cm).  Resolution of midline shift. 11/27.  Patient is evaluated by speech therapy, still not safe to for oral intake.  Place NG tube start tube feeding. 11/29.  Neurosurgery has removed the drain. 11/30.  Patient developed a high fever, CT abdomen showed acalculous cholecystitis.  Started Zosyn. 12/3.  Leukocytosis essentially resolved, restart tube feeding at two thirds of required rate. Since then, patient has been tolerating tube feeding, antibiotics is completed.  PEG tube is placed by GI 12/12.     Principal Problem:   Traumatic subdural hematoma without loss of consciousness (HCC) Active Problems:   Traumatic subarachnoid hemorrhage (HCC)   Acute metabolic encephalopathy   Hypothyroidism   VAD (vascular dementia) (HCC)   Hypokalemia   Essential hypertension   Sinus bradycardia   History of fall   History of CVA (cerebrovascular accident)   Protein-calorie malnutrition, severe   Hypophosphatemia   Hyponatremia   Acute acalculous cholecystitis   Lethargy   Goals of care, counseling/discussion   Acalculous cholecystitis   SDH (subdural hematoma) (HCC)   Assessment and Plan: Acute acalculous cholecystitis. Severe sepsis. Patient developed  significant fever and tachycardia, hypotension 12/1. CT abdomen pelvis showed acute acalculous cholecystitis 11/30.   General surgery consulted and did not recommend surgical intervention. Patient was started on Zosyn. Continues to remain stable at this point.  - transition to augmentin for total 10 day Abx course.  Condition has improved, antibiotic completed.     Traumatic subdural hematoma  Traumatic subarachnoid hemorrhage History of fall with head trauma 2 weeks POA Imaging showed bilateral subdural hematomas with resultant 4 mm midline shift to the left and possible subarachnoid hemorrhage Neurosurgery consulted and performed bilateral burr hole procedure 11/25.  Repeat head imaging post-procedure 11/26, 12/1 showed stable condition. Bilateral drains removed 11/29.  Her mental status appears to be stable and not improving significantly since admission and >4 weeks from initial injury. I predict that further significant improvement is not likely at this time. She was previously non-verbal at baseline.  staples removed 12/9, neurosurgery signed off,  prophylactic keppra completed Due to poor prognosis, palliative care is following. Seen by PT/OT, recommend nursing home placement.  TOC is working on placement.   Dysphagia- severe. Evaluated several times by speech therapy and she is unable to take anything safely by mouth and unlikely to recover this function.  - continue NG tube. PEG placement not attempted by IR after evaluation  PEG tube was placed on 12/12, patient is tolerating tube feeding.    Acute metabolic encephalopathy VAD (vascular dementia) (HCC)  History of stroke. Failure to thrive. Patient has severe dementia at baseline, she is nonverbal.  She has a worsening mental status since admission and appears to have stabilized at new baseline. Long-term  prognosis very poor, palliative care consult obtained.      Hypokalemia. Hypophosphatemia. Hyponatremia. Conditions are  resolved, continue monitoring and reevaluate as transitioning to PEG feedings.  Electrolytes still normal today.    Hypoglycemia secondary to poor p.o. intake. Resolved after tube feeding.   Essential hypertension Discontinued losartan due to low blood pressure.   Hypothyroidism Continue levothyroxine      Subjective:  Patient is smiling, nonverbal.  Tolerating tube feeding.  Physical Exam: Vitals:   12/24/22 1719 12/24/22 2005 12/25/22 0530 12/25/22 0746  BP: (!) 87/65 114/86 106/74 (!) 99/48  Pulse: (!) 102 98 86 92  Resp: 16 18 18 20   Temp: 98 F (36.7 C) 97.7 F (36.5 C) 97.9 F (36.6 C) 99.2 F (37.3 C)  TempSrc:      SpO2: 98% 98% 98% 100%  Weight:      Height:       General exam: Appears calm and comfortable  Respiratory system: Clear to auscultation. Respiratory effort normal. Cardiovascular system: S1 & S2 heard, RRR. No JVD, murmurs, rubs, gallops or clicks. No pedal edema. Gastrointestinal system: Abdomen is nondistended, soft and nontender. No organomegaly or masses felt. Normal bowel sounds heard. Central nervous system: Alert and nonverbal. Extremities: Symmetric 5 x 5 power. Skin: No rashes, lesions or ulcers Psychiatry:  Mood & affect appropriate.    Data Reviewed:  Lab results reviewed.  Family Communication: Husband updated at bedside.  Disposition: Status is: Inpatient Remains inpatient appropriate because: Unsafe discharge, pending nursing home placement.     Time spent: 35 minutes  Author: Marrion Coy, MD 12/25/2022 11:58 AM  For on call review [email or web address].

## 2022-12-25 NOTE — Progress Notes (Signed)
Nutrition Follow-up  DOCUMENTATION CODES:   Severe malnutrition in context of social or environmental circumstances  INTERVENTION:   -D/c Osmolite 1.2  TF via PEG:   Osmolite 1.5 @ 55 ml/hr    120 ml free water flush every 4 hours   Tube feeding regimen provides 1980 kcal (100% of needs), 83 grams of protein, and 1006 ml of H2O. Total free water: 1726 ml daily   NUTRITION DIAGNOSIS:   Severe Malnutrition related to social / environmental circumstances as evidenced by severe fat depletion, severe muscle depletion.  ONgoing  GOAL:   Patient will meet greater than or equal to 90% of their needs  Progressing   MONITOR:   Diet advancement, Labs, Weight trends, TF tolerance, Skin, I & O's  REASON FOR ASSESSMENT:   Consult Enteral/tube feeding initiation and management  ASSESSMENT:   72 y/o female with h/o hypothyroidism, HTN, HLD, CVA, mood disorder and dementia who is admitted with chronic bilateral subdural hematoma now s/p right & left sided burr holes for drainage of subdural hematoma 11/25.  11/27- s/p BSE- NPO, NGT placed, TF initiated 11/29- subdural drains removed by neurosurgery 11/30- s/p BSE- recommend continued NPO, pt developed fever- CT abdomen showed acalculous cholecystitis- zosyn started  12/10- PEG placement unsuccessful by IR 12/12- NGT removed, PEG placed by GI  Reviewed I/O's: +450 ml x 24 hours and +9.2 L since 12/11/22  Pt lying in bed at time of visit. She did not interact with this RD. Noted NGT d/c. TF infusing via PEG: Osmolite 1.2 @ 30 ml/hr, which provides 864 kcals, 43 grams protein, and 590 ml water daily, meeting 51% of estimated kcal needs and 54% of estimated protein needs.   Case discussed with RN, MD, and GI; received permission to advance and adjust TF to better meet pt's nutritional needs.    Wt has been stable over the past week.   Per TOC notes, plan to d/c to SNF once medically stable.   Medications reviewed and include  lovenox.   Labs reviewed: CBGS: 97-138 (inpatient orders for glycemic control are none).    Diet Order:   Diet Order             Diet NPO time specified  Diet effective now                   EDUCATION NEEDS:   No education needs have been identified at this time  Skin:  Skin Assessment: Skin Integrity Issues: Skin Integrity Issues:: Incisions Incisions: closed head  Last BM:  12/24/22 (type 6)  Height:   Ht Readings from Last 1 Encounters:  12/07/22 5\' 5"  (1.651 m)    Weight:   Wt Readings from Last 1 Encounters:  12/19/22 56.3 kg    Ideal Body Weight:  56.8 kg  BMI:  Body mass index is 20.65 kg/m.  Estimated Nutritional Needs:   Kcal:  1700-1900  Protein:  80-95 grams  Fluid:  > 1.7 L    Levada Schilling, RD, LDN, CDCES Registered Dietitian III Certified Diabetes Care and Education Specialist If unable to reach this RD, please use "RD Inpatient" group chat on secure chat between hours of 8am-4 pm daily

## 2022-12-25 NOTE — Progress Notes (Signed)
[facility] GI inpatient brief Progress Note  Patient seen for f/u PEG tube placement on 12/24/22. Husband at bedside, reports no issues to his knowledge with any complaints with feedings. Appreciate RD input as to feedings and caloric needs.  Patient nonverbal, in no apparent distress.   Vitals:   12/25/22 0746 12/25/22 1526  BP: (!) 99/48 94/69  Pulse: 92 80  Resp: 20 18  Temp: 99.2 F (37.3 C) 99.2 F (37.3 C)  SpO2: 100% 98%    HEENT: [place]/AT. PERRLA. EOMI. External ear exam normal. Neck: Supple without adenopathy. Trachea appears midline. Chest: Clear to auscultation. No wheezes or rales. Cardiovascular: Regular rate, normal S1 and S2 heart sounds. No gallop. Abdomen: Soft, non-tender without masses, hepatosplenomegaly or rigidity. Bowel sounds present. PEG site covered with abdominal binder. Binder lifted, PEG site clean, skin normal without erythema or induration. TF going continuously at 30cc/hr.  Extremities: No atrophy. Negative for edema. Pulses 2+ bilaterally.  Neuro: Alert and oriented x 3. Nonfocal. Skin: No obvious rashes. No pallor. Psychiatric: Alert, Nonverbal, apparently hearing adequate. Does not follow simple commands.   Labs:    Latest Ref Rng & Units 12/22/2022    4:19 AM 12/16/2022    5:03 AM 12/15/2022    8:02 AM  CBC  WBC 4.0 - 10.5 K/uL 9.4  10.7  12.5   Hemoglobin 12.0 - 15.0 g/dL 8.9  8.9  9.7   Hematocrit 36.0 - 46.0 % 27.5  26.5  29.1   Platelets 150 - 400 K/uL 527  447  381     CMP     Component Value Date/Time   NA 137 12/25/2022 0413   K 4.3 12/25/2022 0413   CL 108 12/25/2022 0413   CO2 23 12/25/2022 0413   GLUCOSE 130 (H) 12/25/2022 0413   BUN 20 12/25/2022 0413   CREATININE 0.62 12/25/2022 0413   CALCIUM 8.5 (L) 12/25/2022 0413   PROT 6.9 12/12/2022 0308   ALBUMIN 2.3 (L) 12/12/2022 0308   AST 36 12/12/2022 0308   ALT 26 12/12/2022 0308   ALKPHOS 145 (H) 12/12/2022 0308   BILITOT 1.3 (H) 12/12/2022 0308   GFRNONAA >60  12/25/2022 0413   GFRAA 69 (L) 11/11/2013 1252      Impression:  S/p SAH - s/p burr hole procedure 2.   Hx stroke 3.   S/p EGD with PEG tube placement on 12/24/22. No complications. Feedings and free water going without difficulty.   Plan:   Continue TF as per dietary (Registered dietician) recommendations. 2.    GI will sign off for now. Call back if necessary.   Thank you  T. Bing Plume, M.D. ABIM Diplomate in Gastroenterology [facility] [phone] - Cell

## 2022-12-25 NOTE — NC FL2 (Signed)
[place] MEDICAID FL2 LEVEL OF CARE FORM     IDENTIFICATION  Patient Name: Christine Hull Birthdate: September 09, 1950 Sex: female Admission Date (Current Location): 12/04/2022  [facility] and [place] Number:  Chiropodist and Address:         Provider Number: [id number]  Attending Physician Name and Address:  Marrion Coy, MD  Relative Name and Phone Number:  Nou, Lizalde (Spouse)  [phone]    Current Level of Care: Hospital Recommended Level of Care: Skilled Nursing Facility Prior Approval Number:    Date Approved/Denied:   PASRR Number: [id number]  Discharge Plan: SNF    Current Diagnoses: Patient Active Problem List   Diagnosis Date Noted   Lethargy 12/17/2022   Goals of care, counseling/discussion 12/17/2022   Acalculous cholecystitis 12/17/2022   SDH (subdural hematoma) (HCC) 12/17/2022   Acute acalculous cholecystitis 12/13/2022   Hyponatremia 12/12/2022   Hypophosphatemia 12/11/2022   Protein-calorie malnutrition, severe 12/09/2022   Traumatic subdural hematoma without loss of consciousness (HCC) 12/04/2022   History of fall 12/04/2022   Acute metabolic encephalopathy 12/04/2022   History of CVA (cerebrovascular accident) 12/04/2022   Traumatic subarachnoid hemorrhage (HCC) 12/04/2022   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) 08/09/2016   Late effect of lacunar infarction 07/19/2016   Hyperlipidemia LDL goal <100 04/28/2016   Alcohol abuse 05/31/2015   Cardiac device in situ 05/09/2013   Sinus bradycardia 05/09/2013   Essential hypertension 04/01/2013   Near syncope 02/02/2013   Headache 02/02/2013   VAD (vascular dementia) (HCC) 02/02/2013   Hypokalemia 02/02/2013   Prolonged QT interval 02/02/2013   Encounter for preventive health examination 11/17/2012   Chronic back pain greater than 3 months duration 08/16/2011   Hypothyroidism 08/14/2011   Tobacco abuse 08/13/2011    Orientation RESPIRATION BLADDER Height &  Weight        Normal Incontinent Weight: 124 lb 1.9 oz (56.3 kg) Height:  5\' 5"  (165.1 cm)  BEHAVIORAL SYMPTOMS/MOOD NEUROLOGICAL BOWEL NUTRITION STATUS      Incontinent  (Pt getting a peg tube)  AMBULATORY STATUS COMMUNICATION OF NEEDS Skin   Extensive Assist Does not communicate  (incision to head)                       Personal Care Assistance Level of Assistance  Bathing, Dressing, Feeding Bathing Assistance: Maximum assistance Feeding assistance: Maximum assistance - PEG tube Dressing Assistance: Maximum assistance     Functional Limitations Info  Sight, Hearing, Speech Sight Info: Adequate Hearing Info: Impaired Speech Info: Impaired    SPECIAL CARE FACTORS FREQUENCY  PT (By licensed PT), OT (By licensed OT)     PT Frequency: 5 times a week OT Frequency: 5 Times a week ST Frequency: 5 times per week            Contractures Contractures Info: Not present    Additional Factors Info  Code Status Code Status Info: FULL             Current Medications (12/25/2022):  This is the current hospital active medication list Current Facility-Administered Medications  Medication Dose Route Frequency Provider Last Rate Last Admin   acetaminophen (TYLENOL) tablet 650 mg  650 mg Per Tube Q6H PRN Otelia Sergeant, RPH   650 mg at 12/21/22 1832   Or   acetaminophen (TYLENOL) suppository 650 mg  650 mg Rectal Q6H PRN Otelia Sergeant, RPH       enoxaparin (LOVENOX) injection 40 mg  40  mg Subcutaneous Daily Marrion Coy, MD   40 mg at 12/23/22 0946   feeding supplement (OSMOLITE 1.2 CAL) liquid 1,000 mL  1,000 mL Per Tube Continuous [name], MD 30 mL/hr at 12/24/22 2211 1,000 mL at 12/24/22 2211   feeding supplement (OSMOLITE 1.5 CAL) liquid 1,000 mL  1,000 mL Per Tube Continuous Leeroy Bock, MD   Stopped at 12/23/22 2350   free water 120 mL  120 mL Per Tube Q4H Jamelle Rushing L, MD   120 mL at 12/25/22 0000   iohexol (OMNIPAQUE) 300 MG/ML solution 50  mL  50 mL Per Tube Once PRN Berdine Dance, MD       levothyroxine (SYNTHROID) tablet 50 mcg  50 mcg Per Tube Q0600 Otelia Sergeant, RPH   50 mcg at 12/25/22 0528   lidocaine (XYLOCAINE) 1 % (with pres) injection 20 mL  20 mL Intradermal Once Berdine Dance, MD       liothyronine (CYTOMEL) tablet 5 mcg  5 mcg Per Tube Daily Lowella Bandy, RPH   5 mcg at 12/23/22 1610   Oral care mouth rinse  15 mL Mouth Rinse PRN Marrion Coy, MD       oxyCODONE-acetaminophen (PERCOCET/ROXICET) 5-325 MG per tablet 0.5 tablet  0.5 tablet Per NG tube Q4H PRN Marrion Coy, MD   0.5 tablet at 12/16/22 1817   sertraline (ZOLOFT) tablet 50 mg  50 mg Per Tube Daily Otelia Sergeant, RPH   50 mg at 12/23/22 0947   sodium chloride flush (NS) 0.9 % injection 3 mL  3 mL Intravenous Q12H Andris Baumann, MD   3 mL at 12/24/22 2213     Discharge Medications: Please see discharge summary for a list of discharge medications.  Relevant Imaging Results:  Relevant Lab Results:   Additional Information SS- [id number]  Evalin Shawhan E Ivanka Kirshner, LCSW

## 2022-12-25 NOTE — Progress Notes (Signed)
SLP Cancellation Note  Patient Details Name: Christine Hull MRN: [id number] DOB: 1950-01-15   Cancelled treatment:       Reason Eval/Treat Not Completed:  (chart reviewed)  Per chart notes, pt received her PEG placement yesterday. Per TOC note, the POC indicates that pt will transfer to a SNF for ongoing care and therapy now.  Pt has Baseline Dementia and further Cognitive decline appears present in setting of new illness(SDH). Recommend pt have Speech f/u for Dysphagia tx at her SNF post Discharge, and resolution of Acute illness/admit/surgery, to address ongoing oropharyngeal phase dysphagia in hopes to re-establish an oral diet of least restriction. Recommend frequent oral care for hygiene and stimulation of swallowing also. Updated TOC/NSG.        [name], MS, CCC-SLP Speech Language Pathologist Rehab Services; [facility] [phone] (ascom) Hymen Arnett 12/25/2022, 8:26 AM

## 2022-12-25 NOTE — TOC Progression Note (Addendum)
Transition of Care [facility]) - Progression Note    Patient Details  Name: [name] MRN: [id number] Date of Birth: March 25, 1950  [facility]) CM/SW Contact  Liliana Cline, LCSW Phone Number: 12/25/2022, 4:17 PM  Clinical Narrative:    CSW spoke with patient's spouse. He reports they would like [facility], they visited there today. Called [facility] in Admissions to accept bed offer, left a VM requesting a return call with when they could accept patient. Per MD, patient is medically ready.   Expected Discharge Plan:  (TBD) Barriers to Discharge: Continued Medical Work up  Expected Discharge Plan and Services       Living arrangements for the past 2 months: Single Family Home                                       Social Determinants of Health (SDOH) Interventions SDOH Screenings   Food Insecurity: Patient Unable To Answer (12/06/2022)  Housing: Patient Unable To Answer (12/06/2022)  Transportation Needs: Patient Unable To Answer (12/06/2022)  Utilities: Patient Unable To Answer (12/06/2022)  Tobacco Use: High Risk (12/07/2022)    Readmission Risk Interventions     No data to display

## 2022-12-25 NOTE — Plan of Care (Signed)

## 2022-12-26 DIAGNOSIS — S066X0A Traumatic subarachnoid hemorrhage without loss of consciousness, initial encounter: Secondary | ICD-10-CM | POA: Diagnosis not present

## 2022-12-26 DIAGNOSIS — E43 Unspecified severe protein-calorie malnutrition: Secondary | ICD-10-CM | POA: Diagnosis not present

## 2022-12-26 DIAGNOSIS — K810 Acute cholecystitis: Secondary | ICD-10-CM | POA: Diagnosis not present

## 2022-12-26 LAB — GLUCOSE, CAPILLARY
Glucose-Capillary: 106 mg/dL — ABNORMAL HIGH (ref 70–99)
Glucose-Capillary: 110 mg/dL — ABNORMAL HIGH (ref 70–99)
Glucose-Capillary: 110 mg/dL — ABNORMAL HIGH (ref 70–99)
Glucose-Capillary: 120 mg/dL — ABNORMAL HIGH (ref 70–99)
Glucose-Capillary: 86 mg/dL (ref 70–99)
Glucose-Capillary: 99 mg/dL (ref 70–99)

## 2022-12-26 NOTE — TOC Progression Note (Signed)
Transition of Care [facility]) - Progression Note    Patient Details  Name: Christine Hull MRN: [id number] Date of Birth: Jul 27, 1950  Transition of Care [facility]) CM/SW Contact  Rodney Langton, RN Phone Number: 12/26/2022, 3:10 PM  Clinical Narrative:     [facility] to follow up on pending admission, no answer, will continue to follow up for discharge.   Expected Discharge Plan:  (TBD) Barriers to Discharge: Continued Medical Work up  Expected Discharge Plan and Services       Living arrangements for the past 2 months: Single Family Home                                       Social Determinants of Health (SDOH) Interventions SDOH Screenings   Food Insecurity: Patient Unable To Answer (12/06/2022)  Housing: Patient Unable To Answer (12/06/2022)  Transportation Needs: Patient Unable To Answer (12/06/2022)  Utilities: Patient Unable To Answer (12/06/2022)  Tobacco Use: High Risk (12/07/2022)    Readmission Risk Interventions     No data to display

## 2022-12-26 NOTE — Progress Notes (Signed)
Progress Note   Patient: Christine Hull:[id number] DOB: 08/24/1950 DOA: 12/04/2022     22 DOS: the patient was seen and examined on 12/26/2022   Brief hospital course: 72 year old female with past medical history of hypothyroidism, hypertension, prior stroke with vascular dementia brought into the ED after decreased mental status and not walking as much.  She had a fall around a week ago.  In the ER found to have bilateral subdural hematomas with resultant 4 mm midline shift to the left and possible subarachnoid hemorrhage.  Bilateral burr hole procedure done by neurosurgery on 11/25.   11/26. Decrease in size of bilateral subdural collections 5 mm on the right (was 1.5 cm previously), 6 mm on the left (was previously 1.1 cm).  Resolution of midline shift. 11/27.  Patient is evaluated by speech therapy, still not safe to for oral intake.  Place NG tube start tube feeding. 11/29.  Neurosurgery has removed the drain. 11/30.  Patient developed a high fever, CT abdomen showed acalculous cholecystitis.  Started Zosyn. 12/3.  Leukocytosis essentially resolved, restart tube feeding at two thirds of required rate. Since then, patient has been tolerating tube feeding, antibiotics is completed.  PEG tube is placed by GI 12/12.  Patient is medically stable for discharge, pending nursing home placement.    Principal Problem:   Traumatic subdural hematoma without loss of consciousness (HCC) Active Problems:   Traumatic subarachnoid hemorrhage (HCC)   Acute metabolic encephalopathy   Hypothyroidism   VAD (vascular dementia) (HCC)   Hypokalemia   Essential hypertension   Sinus bradycardia   History of fall   History of CVA (cerebrovascular accident)   Protein-calorie malnutrition, severe   Hypophosphatemia   Hyponatremia   Acute acalculous cholecystitis   Lethargy   Goals of care, counseling/discussion   Acalculous cholecystitis   SDH (subdural hematoma) (HCC)   Assessment and  Plan: Acute acalculous cholecystitis. Severe sepsis. Patient developed significant fever and tachycardia, hypotension 12/1. CT abdomen pelvis showed acute acalculous cholecystitis 11/30.   General surgery consulted and did not recommend surgical intervention. Patient was started on Zosyn. Continues to remain stable at this point.  - transition to augmentin for total 10 day Abx course.  Condition has improved, antibiotic completed.     Traumatic subdural hematoma  Traumatic subarachnoid hemorrhage History of fall with head trauma 2 weeks POA Imaging showed bilateral subdural hematomas with resultant 4 mm midline shift to the left and possible subarachnoid hemorrhage Neurosurgery consulted and performed bilateral burr hole procedure 11/25.  Repeat head imaging post-procedure 11/26, 12/1 showed stable condition. Bilateral drains removed 11/29.  Her mental status appears to be stable and not improving significantly since admission and >4 weeks from initial injury. I predict that further significant improvement is not likely at this time. She was previously non-verbal at baseline.  staples removed 12/9, neurosurgery signed off,  prophylactic keppra completed Due to poor prognosis, palliative care is following. Seen by PT/OT, recommend nursing home placement.  TOC is working on placement.   Dysphagia- severe. Evaluated several times by speech therapy and she is unable to take anything safely by mouth and unlikely to recover this function.  - continue NG tube. PEG placement not attempted by IR after evaluation  PEG tube was placed on 12/12, patient is tolerating tube feeding.    Acute metabolic encephalopathy VAD (vascular dementia) (HCC)  History of stroke. Failure to thrive. Patient has severe dementia at baseline, she is nonverbal.  She has a worsening mental status since  admission and appears to have stabilized at new baseline. Long-term prognosis very poor, palliative care consult obtained.       Hypokalemia. Hypophosphatemia. Hyponatremia. Conditions are resolved, continue monitoring and reevaluate as transitioning to PEG feedings.  Electrolytes still normal today.    Hypoglycemia secondary to poor p.o. intake. Resolved after tube feeding.   Essential hypertension Discontinued losartan due to low blood pressure.   Hypothyroidism Continue levothyroxine     Condition stable, no change in treatment plan.  Pending nursing placement.      Subjective: Has no complaint.  Physical Exam: Vitals:   12/25/22 0746 12/25/22 1526 12/25/22 1941 12/26/22 0734  BP: (!) 99/48 94/69 103/74 99/68  Pulse: 92 80 80 82  Resp: 20 18 18 16   Temp: 99.2 F (37.3 C) 99.2 F (37.3 C) 99 F (37.2 C) 98.1 F (36.7 C)  TempSrc:      SpO2: 100% 98% 97% 100%  Weight:      Height:       General exam: Appears calm and comfortable  Respiratory system: Clear to auscultation. Respiratory effort normal. Cardiovascular system: S1 & S2 heard, RRR. No JVD, murmurs, rubs, gallops or clicks. No pedal edema. Gastrointestinal system: Abdomen is nondistended, soft and nontender. No organomegaly or masses felt. Normal bowel sounds heard. Central nervous system: Alert and nonverbal. Extremities: Symmetric 5 x 5 power. Skin: No rashes, lesions or ulcers Psychiatry:  Mood & affect appropriate.    Data Reviewed:  There are no new results to review at this time.  Family Communication: none  Disposition: Status is: Inpatient Remains inpatient appropriate because: Unsafe discharge.     Time spent: 25 minutes  Author: Marrion Coy, MD 12/26/2022 9:51 AM  For on call review [email or web address].

## 2022-12-27 ENCOUNTER — Inpatient Hospital Stay: Payer: Medicare Other

## 2022-12-27 DIAGNOSIS — R509 Fever, unspecified: Secondary | ICD-10-CM | POA: Diagnosis not present

## 2022-12-27 DIAGNOSIS — S066X0A Traumatic subarachnoid hemorrhage without loss of consciousness, initial encounter: Secondary | ICD-10-CM | POA: Diagnosis not present

## 2022-12-27 DIAGNOSIS — K819 Cholecystitis, unspecified: Secondary | ICD-10-CM | POA: Diagnosis not present

## 2022-12-27 LAB — GLUCOSE, CAPILLARY
Glucose-Capillary: 113 mg/dL — ABNORMAL HIGH (ref 70–99)
Glucose-Capillary: 116 mg/dL — ABNORMAL HIGH (ref 70–99)
Glucose-Capillary: 117 mg/dL — ABNORMAL HIGH (ref 70–99)
Glucose-Capillary: 120 mg/dL — ABNORMAL HIGH (ref 70–99)
Glucose-Capillary: 127 mg/dL — ABNORMAL HIGH (ref 70–99)
Glucose-Capillary: 99 mg/dL (ref 70–99)

## 2022-12-27 LAB — URINALYSIS, COMPLETE (UACMP) WITH MICROSCOPIC
Bilirubin Urine: NEGATIVE
Glucose, UA: NEGATIVE mg/dL
Hgb urine dipstick: NEGATIVE
Ketones, ur: NEGATIVE mg/dL
Leukocytes,Ua: NEGATIVE
Nitrite: NEGATIVE
Protein, ur: NEGATIVE mg/dL
Specific Gravity, Urine: 1.015 (ref 1.005–1.030)
Squamous Epithelial / HPF: 0 /[HPF] (ref 0–5)
pH: 6 (ref 5.0–8.0)

## 2022-12-27 LAB — BASIC METABOLIC PANEL
Anion gap: 7 (ref 5–15)
BUN: 13 mg/dL (ref 8–23)
CO2: 26 mmol/L (ref 22–32)
Calcium: 8.5 mg/dL — ABNORMAL LOW (ref 8.9–10.3)
Chloride: 104 mmol/L (ref 98–111)
Creatinine, Ser: 0.53 mg/dL (ref 0.44–1.00)
GFR, Estimated: >60 mL/min (ref >60)
Glucose, Bld: 121 mg/dL — ABNORMAL HIGH (ref 70–99)
Potassium: 4.1 mmol/L (ref 3.5–5.1)
Sodium: 137 mmol/L (ref 135–145)

## 2022-12-27 LAB — SARS CORONAVIRUS 2 BY RT PCR: SARS Coronavirus 2 by RT PCR: NEGATIVE

## 2022-12-27 LAB — MAGNESIUM: Magnesium: 1.7 mg/dL (ref 1.7–2.4)

## 2022-12-27 LAB — CBC
HCT: 28.2 % — ABNORMAL LOW (ref 36.0–46.0)
Hemoglobin: 9.2 g/dL — ABNORMAL LOW (ref 12.0–15.0)
MCH: 31 pg (ref 26.0–34.0)
MCHC: 32.6 g/dL (ref 30.0–36.0)
MCV: 94.9 fL (ref 80.0–100.0)
Platelets: 525 10*3/uL — ABNORMAL HIGH (ref 150–400)
RBC: 2.97 MIL/uL — ABNORMAL LOW (ref 3.87–5.11)
RDW: 14.9 % (ref 11.5–15.5)
WBC: 8.6 10*3/uL (ref 4.0–10.5)
nRBC: 0 % (ref 0.0–0.2)

## 2022-12-27 LAB — PROCALCITONIN: Procalcitonin: 0.13 ng/mL

## 2022-12-27 NOTE — Progress Notes (Signed)
Patient's husband notified this RN of blood draining from patient's mouth.  Frank blood draining from right side of patient's mouth.  When attempted to assess oral contents, patient clenches teeth.  Respirations clear, vitals stable, PEG tube CDI.  Dr Chipper Herb notified with orders to stop tube feed and suction.  Oral suctioning with no blood or any drainage.  Tube feed restarted at 55 ml/hr with HIB elevated to 40 degrees.

## 2022-12-27 NOTE — Progress Notes (Signed)
Progress Note   Patient: Christine Hull OZH:[id number] DOB: April 14, 1950 DOA: 12/04/2022     23 DOS: the patient was seen and examined on 12/27/2022   Brief hospital course: 72 year old female with past medical history of hypothyroidism, hypertension, prior stroke with vascular dementia brought into the ED after decreased mental status and not walking as much.  She had a fall around a week ago.  In the ER found to have bilateral subdural hematomas with resultant 4 mm midline shift to the left and possible subarachnoid hemorrhage.  Bilateral burr hole procedure done by neurosurgery on 11/25.   11/26. Decrease in size of bilateral subdural collections 5 mm on the right (was 1.5 cm previously), 6 mm on the left (was previously 1.1 cm).  Resolution of midline shift. 11/27.  Patient is evaluated by speech therapy, still not safe to for oral intake.  Place NG tube start tube feeding. 11/29.  Neurosurgery has removed the drain. 11/30.  Patient developed a high fever, CT abdomen showed acalculous cholecystitis.  Started Zosyn. 12/3.  Leukocytosis essentially resolved, restart tube feeding at two thirds of required rate. Since then, patient has been tolerating tube feeding, antibiotics is completed.  PEG tube is placed by GI 12/12.  Patient is medically stable for discharge, pending nursing home placement.    Principal Problem:   Traumatic subdural hematoma without loss of consciousness (HCC) Active Problems:   Traumatic subarachnoid hemorrhage (HCC)   Acute metabolic encephalopathy   Hypothyroidism   VAD (vascular dementia) (HCC)   Hypokalemia   Essential hypertension   Sinus bradycardia   History of fall   History of CVA (cerebrovascular accident)   Protein-calorie malnutrition, severe   Hypophosphatemia   Hyponatremia   Acute acalculous cholecystitis   Lethargy   Goals of care, counseling/discussion   Acalculous cholecystitis   SDH (subdural hematoma) (HCC)   Assessment and  Plan: Low-grade fever. Patient had temperature 100.5, she is also less responsive today. Will check a UA, chest x-ray, and COVID test.  Also check procalcitonin level.  Hold off antibiotics until test results available.   Acute acalculous cholecystitis. Severe sepsis. Patient developed significant fever and tachycardia, hypotension 12/1. CT abdomen pelvis showed acute acalculous cholecystitis 11/30.   General surgery consulted and did not recommend surgical intervention. Patient was started on Zosyn. Continues to remain stable at this point.  - transition to augmentin for total 10 day Abx course.  Condition has improved, antibiotic completed. Examination today did not show right upper quadrant tenderness.     Traumatic subdural hematoma  Traumatic subarachnoid hemorrhage History of fall with head trauma 2 weeks POA Imaging showed bilateral subdural hematomas with resultant 4 mm midline shift to the left and possible subarachnoid hemorrhage Neurosurgery consulted and performed bilateral burr hole procedure 11/25.  Repeat head imaging post-procedure 11/26, 12/1 showed stable condition. Bilateral drains removed 11/29.  Her mental status appears to be stable and not improving significantly since admission and >4 weeks from initial injury. I predict that further significant improvement is not likely at this time. She was previously non-verbal at baseline.  staples removed 12/9, neurosurgery signed off,  prophylactic keppra completed Due to poor prognosis, palliative care is following. Seen by PT/OT, recommend nursing home placement.  TOC is working on placement.    Dysphagia- severe. Evaluated several times by speech therapy and she is unable to take anything safely by mouth and unlikely to recover this function.  - continue NG tube. PEG placement not attempted by IR after evaluation  PEG tube was placed on 12/12, patient is tolerating tube feeding.    Acute metabolic encephalopathy VAD  (vascular dementia) (HCC)  History of stroke. Failure to thrive. Patient has severe dementia at baseline, she is nonverbal.  She has a worsening mental status since admission and appears to have stabilized at new baseline. Long-term prognosis very poor, palliative care consult obtained.      Hypokalemia. Hypophosphatemia. Hyponatremia. Conditions are resolved, continue monitoring and reevaluate as transitioning to PEG feedings.  Condition was improved today.    Hypoglycemia secondary to poor p.o. intake. Resolved after tube feeding.   Essential hypertension Discontinued losartan due to low blood pressure.   Hypothyroidism Continue levothyroxine      Subjective:  Patient is less responsive today per husband.  Low-grade fever noted.  Physical Exam: Vitals:   12/26/22 1521 12/26/22 1947 12/27/22 0408 12/27/22 0733  BP: (!) 144/130 105/67 107/77 99/69  Pulse: 85 79 88 85  Resp: 18 18 18 20   Temp: (!) 100.5 F (38.1 C) 98.9 F (37.2 C) 99.2 F (37.3 C) 99.4 F (37.4 C)  TempSrc: Axillary     SpO2: 99% 99% 100% 100%  Weight:      Height:       General exam: Appears calm and comfortable  Respiratory system: Clear to auscultation. Respiratory effort normal. Cardiovascular system: S1 & S2 heard, RRR. No JVD, murmurs, rubs, gallops or clicks. No pedal edema. Gastrointestinal system: Abdomen is nondistended, soft and nontender. No organomegaly or masses felt. Normal bowel sounds heard. Central nervous system: Drowsy and nonverbal. Extremities: Symmetric 5 x 5 power. Skin: No rashes, lesions or ulcers Psychiatry: Flat affect   Data Reviewed:  Lab results reviewed.  Family Communication: Husband updated.  Disposition: Status is: Inpatient Remains inpatient appropriate because: Severity of disease.     Time spent: 50 minutes  Author: Marrion Coy, MD 12/27/2022 10:40 AM  For on call review [email or web address].

## 2022-12-28 DIAGNOSIS — K810 Acute cholecystitis: Secondary | ICD-10-CM | POA: Diagnosis not present

## 2022-12-28 DIAGNOSIS — S066X0A Traumatic subarachnoid hemorrhage without loss of consciousness, initial encounter: Secondary | ICD-10-CM | POA: Diagnosis not present

## 2022-12-28 DIAGNOSIS — F01C18 Vascular dementia, severe, with other behavioral disturbance: Secondary | ICD-10-CM | POA: Diagnosis not present

## 2022-12-28 LAB — GLUCOSE, CAPILLARY
Glucose-Capillary: 106 mg/dL — ABNORMAL HIGH (ref 70–99)
Glucose-Capillary: 107 mg/dL — ABNORMAL HIGH (ref 70–99)
Glucose-Capillary: 133 mg/dL — ABNORMAL HIGH (ref 70–99)
Glucose-Capillary: 136 mg/dL — ABNORMAL HIGH (ref 70–99)

## 2022-12-28 MED ORDER — OSMOLITE 1.5 CAL PO LIQD: 1000.0000 mL | ORAL | Status: DC

## 2022-12-28 MED ORDER — LIOTHYRONINE SODIUM 5 MCG PO TABS: 5.0000 ug | ORAL_TABLET | Freq: Every day | ORAL | Status: AC

## 2022-12-28 MED ORDER — POLYETHYLENE GLYCOL 3350 17 G PO PACK: 17.0000 g | PACK | Freq: Every day | ORAL | Status: DC | PRN

## 2022-12-28 MED ORDER — FREE WATER: 120.0000 mL | Status: DC

## 2022-12-28 MED ORDER — POLYETHYLENE GLYCOL 3350 17 G PO PACK: 17.0000 g | PACK | Freq: Once | ORAL | Status: DC

## 2022-12-28 MED ORDER — DONEPEZIL HCL 10 MG PO TABS: 10.0000 mg | ORAL_TABLET | Freq: Every day | ORAL | Status: AC

## 2022-12-28 MED ORDER — LEVOTHYROXINE SODIUM 50 MCG PO TABS: 50.0000 ug | ORAL_TABLET | Freq: Every day | ORAL | 0 refills | Status: AC

## 2022-12-28 MED ORDER — SERTRALINE HCL 50 MG PO TABS: 50.0000 mg | ORAL_TABLET | Freq: Every day | ORAL | Status: DC

## 2022-12-28 NOTE — Progress Notes (Signed)
Physical Therapy Treatment Patient Details Name: Christine Hull MRN: [id number] DOB: 1950-06-22 Today's Date: 12/28/2022   History of Present Illness Pt is a 72 year old female presenting to the ED after a fall, noted with progressive weakness, functional decline.  Admitted for management of bila SDH, s/p bilateral burr holes (11/25).  Hospital course additionally significnat for acalculous cholecystitis.  Plan for PEG placement today 12/12.   PMH significant for hypothyroidism, hypertension, prior stroke with vascular dementia    PT Comments  Continues to require max/total assist for all functional mobility; intermittently resistant to isolated movement of extremities and functional movement of full body during session. Heavy posterior lean/weight shift in unsupported sitting this date; moderate pushing behaviors towards R (worsened with L UE in closed-chain position).  Absent righting reactions; unable to maintain without full physical assist this date.  Progress towards goals appears limited; inconsistent participation and tolerance of skilled interventions; limited/no carry-over of techniques between sessions.  Will continue to monitor and update POC/trial accordingly.     If plan is discharge home, recommend the following: Two people to help with bathing/dressing/bathroom;Two people to help with walking and/or transfers   Can travel by private vehicle        Equipment Recommendations  BSC/3in1;Wheelchair cushion (measurements PT);Wheelchair (measurements PT);Hoyer lift;Hospital bed    Recommendations for Other Services       Precautions / Restrictions Precautions Precautions: Fall Precaution Comments: NPO/NGT (pending PEG) Restrictions Weight Bearing Restrictions Per Provider Order: No     Mobility  Bed Mobility Overal bed mobility: Needs Assistance Bed Mobility: Supine to Sit, Sit to Supine, Rolling Rolling: Max assist, Total assist, +2 for physical assistance    Supine to sit: Max assist, Total assist, +2 for physical assistance Sit to supine: Max assist, Total assist, +2 for physical assistance   General bed mobility comments: generally fearful, resistant of all movement efforts; heavy retropulsion with transition towards edge of bed    Transfers                   General transfer comment: unsafe/unable this date    Ambulation/Gait               General Gait Details: unsafe/unable   Stairs             Wheelchair Mobility     Tilt Bed    Modified Rankin (Stroke Patients Only)       Balance Overall balance assessment: Needs assistance Sitting-balance support: No upper extremity supported, Feet supported Sitting balance-Leahy Scale: Poor Sitting balance - Comments: posterior lean with pushing behaviors towards R this date; absent righting reactions, poor awareness of midline, all planes                                    Cognition Arousal: Alert Behavior During Therapy: Flat affect, Agitated, Anxious Overall Cognitive Status: History of cognitive impairments - at baseline                                          Exercises Other Exercises Other Exercises: Unsupported sitting, attempted to facilitate midline orientation/positioning; patient with heave posterior lean, pushing behaviors towards R this date.  Absent righting reactions; unable to integrate functional activity beyond static sitting (generally resistant to hand-over-hand) Other Exercises: Dep for rolling, clothing  change, hygiene after incontinent bladder    General Comments        Pertinent Vitals/Pain Pain Assessment Pain Assessment: Faces Pain Score: 0-No pain    Home Living                          Prior Function            PT Goals (current goals can now be found in the care plan section) Acute Rehab PT Goals Patient Stated Goal: per husband, to trial rehab PT Goal Formulation: With  family Time For Goal Achievement: 01/04/23 Potential to Achieve Goals: Fair Progress towards PT goals: Not progressing toward goals - comment    Frequency    Min 1X/week      PT Plan      Co-evaluation              AM-PAC PT "6 Clicks" Mobility   Outcome Measure  Help needed turning from your back to your side while in a flat bed without using bedrails?: Total Help needed moving from lying on your back to sitting on the side of a flat bed without using bedrails?: Total Help needed moving to and from a bed to a chair (including a wheelchair)?: Total Help needed standing up from a chair using your arms (e.g., wheelchair or bedside chair)?: Total Help needed to walk in hospital room?: Total Help needed climbing 3-5 steps with a railing? : Total 6 Click Score: 6    End of Session     Patient left: in bed;with bed alarm set;with family/visitor present;with call bell/phone within reach Nurse Communication: Mobility status PT Visit Diagnosis: Other symptoms and signs involving the nervous system (R29.898)     Time: 1610-9604 PT Time Calculation (min) (ACUTE ONLY): 22 min  Charges:    $Therapeutic Activity: 8-22 mins PT General Charges $$ ACUTE PT VISIT: 1 Visit                     Christine Hull, PT, DPT, NCS 12/28/22, 10:26 AM [phone]

## 2022-12-28 NOTE — Telephone Encounter (Signed)
Patient is still admitted.

## 2022-12-28 NOTE — Discharge Summary (Signed)
Physician Discharge Summary   Patient: Christine Hull MRN: [id number] DOB: 28-Nov-1950  Admit date:     12/04/2022  Discharge date: 12/28/22  Discharge Physician: Marrion Coy   PCP: Annita Brod, MD   Recommendations at discharge:   Follow-up with PCP in 1 week. Follow-up with neurosurgery as scheduled. Follow-up with palliative care. Follow-up with speech therapy. Follow-up with dietitian for tube feeding adjustment  Discharge Diagnoses: Principal Problem:   Traumatic subdural hematoma without loss of consciousness (HCC) Active Problems:   Traumatic subarachnoid hemorrhage (HCC)   Acute metabolic encephalopathy   Hypothyroidism   VAD (vascular dementia) (HCC)   Hypokalemia   Essential hypertension   Sinus bradycardia   History of fall   History of CVA (cerebrovascular accident)   Protein-calorie malnutrition, severe   Hypophosphatemia   Hyponatremia   Acute acalculous cholecystitis   Lethargy   Goals of care, counseling/discussion   Acalculous cholecystitis   SDH (subdural hematoma) (HCC)   Low grade fever  Resolved Problems:   * No resolved hospital problems. *  Hospital Course: 72 year old female with past medical history of hypothyroidism, hypertension, prior stroke with vascular dementia brought into the ED after decreased mental status and not walking as much.  She had a fall around a week ago.  In the ER found to have bilateral subdural hematomas with resultant 4 mm midline shift to the left and possible subarachnoid hemorrhage.  Bilateral burr hole procedure done by neurosurgery on 11/25.   11/26. Decrease in size of bilateral subdural collections 5 mm on the right (was 1.5 cm previously), 6 mm on the left (was previously 1.1 cm).  Resolution of midline shift. 11/27.  Patient is evaluated by speech therapy, still not safe to for oral intake.  Place NG tube start tube feeding. 11/29.  Neurosurgery has removed the drain. 11/30.  Patient developed a high  fever, CT abdomen showed acalculous cholecystitis.  Started Zosyn. 12/3.  Leukocytosis essentially resolved, restart tube feeding at two thirds of required rate. Since then, patient has been tolerating tube feeding, antibiotics is completed.  PEG tube is placed by GI 12/12.  Patient is medically stable for discharge, pending nursing home placement. Patient has approval today for nursing placement.  Assessment and Plan:  Low-grade fever. Patient had temperature 100.5 the evening of 12/14, she is also less responsive.  UA, chest x-ray, and COVID test all negative.  Also check procalcitonin level not elevated.   Has no recurrence.     Acute acalculous cholecystitis. Severe sepsis. Patient developed significant fever and tachycardia, hypotension 12/1. CT abdomen pelvis showed acute acalculous cholecystitis 11/30.   General surgery consulted and did not recommend surgical intervention. Patient was started on Zosyn. Continues to remain stable at this point.  - transition to augmentin for total 10 day Abx course.  Condition has improved, antibiotic completed. Examination today did not show right upper quadrant tenderness.     Traumatic subdural hematoma  Traumatic subarachnoid hemorrhage History of fall with head trauma 2 weeks POA Imaging showed bilateral subdural hematomas with resultant 4 mm midline shift to the left and possible subarachnoid hemorrhage Neurosurgery consulted and performed bilateral burr hole procedure 11/25.  Repeat head imaging post-procedure 11/26, 12/1 showed stable condition. Bilateral drains removed 11/29.  Her mental status appears to be stable and not improving significantly since admission and >4 weeks from initial injury. I predict that further significant improvement is not likely at this time. She was previously non-verbal at baseline.  staples removed 12/9, neurosurgery  signed off,  prophylactic keppra completed Due to poor prognosis, palliative care is  following. Patient has been accepted to nursing home placement.   Dysphagia- severe. Evaluated several times by speech therapy and she is unable to take anything safely by mouth and unlikely to recover this function.  - continue NG tube. PEG placement not attempted by IR after evaluation  PEG tube was placed on 12/12, patient is tolerating tube feeding.    Acute metabolic encephalopathy VAD (vascular dementia) (HCC)  History of stroke. Failure to thrive. Patient has severe dementia at baseline, she is nonverbal.  She has a worsening mental status since admission and appears to have stabilized at new baseline. Long-term prognosis very poor, palliative care consult obtained.      Hypokalemia. Hypophosphatemia. Hyponatremia. Conditions are resolved, continue monitoring and reevaluate as transitioning to PEG feedings.  Condition all improved.    Hypoglycemia secondary to poor p.o. intake. Resolved after tube feeding.   Essential hypertension Discontinued losartan due to low blood pressure.   Hypothyroidism Continue levothyroxine          Consultants: GI, Neurosurgery Procedures performed: Blur Hole, PEG. Disposition: Skilled nursing facility Diet recommendation:  Discharge Diet Orders (From admission, onward)     Start     Ordered   12/28/22 0000  Diet general       Comments: NPO, tube feed ordered   12/28/22 0950           NPO Tube feeding ordered DISCHARGE MEDICATION: Allergies as of 12/28/2022   No Known Allergies      Medication List     STOP taking these medications    losartan 100 MG tablet Commonly known as: COZAAR   nadolol 20 MG tablet Commonly known as: CORGARD       TAKE these medications    donepezil 10 MG tablet Commonly known as: ARICEPT Place 1 tablet (10 mg total) into feeding tube daily. What changed: how to take this   feeding supplement (OSMOLITE 1.5 CAL) Liqd Place 1,000 mLs into feeding tube continuous.   free water  Soln Place 120 mLs into feeding tube every 4 (four) hours.   levothyroxine 50 MCG tablet Commonly known as: Synthroid Place 1 tablet (50 mcg total) into feeding tube daily before breakfast. What changed: See the new instructions.   liothyronine 5 MCG tablet Commonly known as: CYTOMEL Place 1 tablet (5 mcg total) into feeding tube daily. What changed: See the new instructions.   liver oil-zinc oxide 40 % ointment Commonly known as: DESITIN Apply 1 application topically as needed for irritation.   sertraline 50 MG tablet Commonly known as: ZOLOFT Place 1 tablet (50 mg total) into feeding tube daily. What changed: how to take this   triamcinolone cream 0.5 % Commonly known as: KENALOG APPLY THIN LAYER TOPICALLY TO THE AFFECTED AREA TWICE DAILY        Contact information for follow-up providers     Lovenia Kim, MD Follow up on 01/20/2023.   Specialty: Neurosurgery Contact information: [address] [place] [address] [phone]         Annita Brod, MD Follow up in 1 week(s).   Specialty: Internal Medicine Why: Hospital follow up Contact information: [address] [place] [address] [phone]              Contact information for after-discharge care     Destination     [facility], [facility] Preferred SNF .  Service: Skilled Nursing Contact information: [address] [place] [address] [phone]                    Discharge Exam: Filed Weights   12/17/22 0343 12/18/22 0608 12/19/22 0456  Weight: 58.6 kg 58.3 kg 56.3 kg   General exam: Appears calm and comfortable  Respiratory system: Clear to auscultation. Respiratory effort normal. Cardiovascular system: S1 & S2 heard, RRR. No JVD, murmurs, rubs, gallops or clicks. No pedal edema. Gastrointestinal system: Abdomen is nondistended, soft and nontender. No organomegaly or masses felt. Normal bowel sounds  heard. Central nervous system: Alert and nonverbal. Extremities: Symmetric  Skin: No rashes, lesions or ulcers Psychiatry: Mood & affect appropriate.    Condition at discharge: good  The results of significant diagnostics from this hospitalization (including imaging, microbiology, ancillary and laboratory) are listed below for reference.   Imaging Studies: DG Chest Port 1 View Result Date: 12/27/2022 CLINICAL DATA:  [id number] Fever [id number]. EXAM: PORTABLE CHEST 1 VIEW COMPARISON:  Chest radiograph 12/12/2022. FINDINGS: Implantable loop recorder projects over the left sternal border. Improved aeration of the left lung base with persistent retrocardiac opacity and associated elevation of the left hemidiaphragm, favored to reflect atelectasis. No new airspace disease. Low lung volumes accentuate the cardiomediastinal silhouette. No pleural effusion or pneumothorax. IMPRESSION: Improved aeration of the left lung base since 12/12/2022 with persistent retrocardiac opacity and associated elevation of the left hemidiaphragm, favored to reflect atelectasis. No new airspace disease. Electronically Signed   By: Orvan Falconer M.D.   On: 12/27/2022 14:52   IR GASTROSTOMY TUBE MOD SED Result Date: 12/22/2022 INDICATION: Stroke, dysphagia EXAM: FLUORO EXAM OF THE ABDOMEN WITH STOMACH INSUFFLATION. MEDICATIONS: GLUCAGON 0.5 MG IV ANESTHESIA/SEDATION: Total intra-service moderate Sedation Time: NONE. The patient's level of consciousness and vital signs were monitored continuously by radiology nursing throughout the procedure under my direct supervision. CONTRAST:  NONE.-administered into the gastric lumen. FLUOROSCOPY: Radiation Exposure Index (as provided by the fluoroscopic device): 5.5 mGy Kerma COMPLICATIONS: None immediate. PROCEDURE: Informed written consent was obtained from [name] after a thorough discussion of the procedural risks, benefits and alternatives. All questions were addressed.  Maximal Sterile Barrier Technique was utilized including caps, mask, sterile gowns, sterile gloves, sterile drape, hand hygiene and skin antiseptic. A timeout was performed prior to the initiation of the procedure. Under fluoroscopy, a 5 French orogastric catheter was advanced into the stomach without difficulty. Fluoroscopy of the abdomen demonstrates marked gaseous distention of the bowel predominately in the transverse colon overlying the stomach. Stomach was insufflated with air. Despite insufflation, there is no clear safe fluoroscopic percutaneous access window related to the overlying air distended transverse colon. This correlates with the CT from 12/12/2022. Therefore, the procedure cannot be performed by fluoroscopic technique. Recommend surgical consultation for gastrostomy placement. IMPRESSION: Unable to perform percutaneous gastrostomy by fluoroscopic technique as above. See above recommendation. Electronically Signed   By: Judie Petit.  Shick M.D.   On: 12/22/2022 09:59   CT HEAD WO CONTRAST ( ) Result Date: 12/13/2022 CLINICAL DATA:  Head trauma, moderate to severe. EXAM: CT HEAD WITHOUT CONTRAST TECHNIQUE: Contiguous axial images were obtained from the base of the skull through the vertex without intravenous contrast. RADIATION DOSE REDUCTION: This exam was performed according to the departmental dose-optimization program which includes automated exposure control, adjustment of the mA and/or kV according to patient size and/or use of iterative reconstruction technique. COMPARISON:  CT head without contrast 12/08/2022 and 12/04/2022. FINDINGS: Brain: Bilateral burr  holes again noted. The surgical drains were removed. Left greater than right pneumocephalus remains. The collections are decompressed bilaterally residual bilateral 5-6 mm extra-axial collections are present. No acute hemorrhage is present in either collection. Moderate atrophy and white matter changes are present. The ventricles are  proportionate to the degree of atrophy. Deep brain nuclei are within normal limits. No acute or focal cortical infarcts are present. The brainstem and cerebellum are within normal limits. Vascular: No hyperdense vessel or unexpected calcification. Skull: The calvarium is otherwise intact. Skin stables are in place over the burr holes. Sinuses/Orbits: Mild mucosal thickening is present in the right maxillary and sphenoid sinus. No fluid levels are present. The globes and orbits are within normal limits. Right-sided NG tube is in place. IMPRESSION: 1. Interval removal of bilateral surgical drains with partial decompression of bilateral extra-axial collections. 2. Residual bilateral 5-6 mm extra-axial collections. 3. No acute hemorrhage. 4. Moderate atrophy and white matter disease likely reflects the sequela of chronic microvascular ischemia. Electronically Signed   By: Marin Roberts M.D.   On: 12/13/2022 14:21   DG Chest Port 1 View Result Date: 12/12/2022 CLINICAL DATA:  Fever EXAM: PORTABLE CHEST 1 VIEW COMPARISON:  Chest x-ray 12/09/2022 FINDINGS: Enteric tube extends below the diaphragm. There some increasing retrocardiac opacities. Right lung is clear. No pleural effusion or pneumothorax. Cardiomediastinal silhouette is within normal limits. No acute fractures are seen. IMPRESSION: Increasing retrocardiac opacities, atelectasis versus pneumonia. Electronically Signed   By: Darliss Cheney M.D.   On: 12/12/2022 20:15   CT ABDOMEN WO CONTRAST Result Date: 12/12/2022 CLINICAL DATA:  Preoperative for gastrostomy. EXAM: CT ABDOMEN WITHOUT CONTRAST TECHNIQUE: Multidetector CT imaging of the abdomen was performed following the standard protocol without IV contrast. RADIATION DOSE REDUCTION: This exam was performed according to the departmental dose-optimization program which includes automated exposure control, adjustment of the mA and/or kV according to patient size and/or use of iterative reconstruction  technique. COMPARISON:  Abdominal radiographs 12/09/2022 FINDINGS: Lower chest: Moderate left and small right pleural effusions with basilar atelectasis or consolidation bilaterally. Hepatobiliary: No focal liver lesions. The gallbladder is distended with gallbladder wall thickening and pericholecystic edema/stranding. No stones are identified. The appearance is likely to represent cholecystitis, possibly acalculous cholecystitis or occult stones. No bile duct dilatation. Pancreas: Unremarkable. No pancreatic ductal dilatation or surrounding inflammatory changes. Spleen: Normal in size without focal abnormality. Adrenals/Urinary Tract: Adrenal glands are unremarkable. Kidneys are normal, without renal calculi, focal lesion, or hydronephrosis. Stomach/Bowel: Enteric tube terminates in the body of the stomach. No gastric wall thickening or distention. Visualized portions of small and large bowel are not abnormally distended. Stool-filled colon. Vascular/Lymphatic: Aortic atherosclerosis. No enlarged abdominal lymph nodes. Other: No free air or free fluid in the abdomen. Abdominal wall musculature appears intact. Musculoskeletal: Degenerative changes in the spine. IMPRESSION: 1. Gallbladder distention with gallbladder wall thickening and pericholecystic edema/stranding. Changes suggest acute cholecystitis. No stones are identified. Electronically Signed   By: Burman Nieves M.D.   On: 12/12/2022 19:02   DG Chest Port 1 View Result Date: 12/09/2022 CLINICAL DATA:  Fever NG tube placement EXAM: PORTABLE CHEST 1 VIEW COMPARISON:  None Available. FINDINGS: Electronic device over the central chest. Atelectasis at the bases. No consolidation or effusion. Normal cardiac size. No pneumothorax. Esophageal tube tip below the diaphragm but incompletely visualized IMPRESSION: Esophageal tube tip below the diaphragm but incompletely visualized. Atelectasis at the bases. Electronically Signed   By: Jasmine Pang M.D.   On:  12/09/2022 21:47  DG Abd Portable 1V Result Date: 12/09/2022 CLINICAL DATA:  Nasogastric tube placement. EXAM: PORTABLE ABDOMEN - 1 VIEW COMPARISON:  None Available. FINDINGS: A nasogastric tube is seen with the tip overlying the mid gastric body. No dilated bowel loops seen in the visualized upper and mid abdomen IMPRESSION: Nasogastric tube tip overlies the mid gastric body. Electronically Signed   By: Danae Orleans M.D.   On: 12/09/2022 18:47   CT HEAD WO CONTRAST ( ) Result Date: 12/08/2022 CLINICAL DATA:  follow up burr hole EXAM: CT HEAD WITHOUT CONTRAST TECHNIQUE: Contiguous axial images were obtained from the base of the skull through the vertex without intravenous contrast. RADIATION DOSE REDUCTION: This exam was performed according to the departmental dose-optimization program which includes automated exposure control, adjustment of the mA and/or kV according to patient size and/or use of iterative reconstruction technique. COMPARISON:  CT head 12/04/22 FINDINGS: Limitations: Motion degraded exam Brain: Interval postsurgical changes from bilateral burr hole craniotomies and placement of subdural drainage catheters. The right-sided drainage catheter terminates in the craniotomy. There is postoperative pneumocephalus that causes mass effect on the bilateral frontal lobes resulting in a somewhat peaked appearance of the bilateral frontal lobes. While nonspecific, this appearance could be seen in the setting of tension pneumocephalus, which is not excluded. Compared to prior exam there is interval decrease in size of bilateral subdural collections which now measure up to 5 mm on the right 6 mm on the left, previously 1.5 cm on the right 1.1 cm on the left. There is interval resolution of midline shift. No new sites of hemorrhage. No CT evidence of an acute cortical infarct. There is slight interval increase in size of the right temporal horn, which may be secondary to decreased mass effect, but early  hydrocephalus is not entirely excluded. Vascular: No hyperdense vessel or unexpected calcification. Skull: Bifrontal burr hole craniotomies. Sinuses/Orbits: No middle ear or mastoid effusion. Paranasal sinuses are grossly clear. Orbits are unremarkable. Other: None IMPRESSION: Motion degraded exam. 1. Interval postsurgical changes from bilateral burr hole craniotomies and placement of subdural drainage catheters. The right-sided drainage catheter terminates in the craniotomy defect. 2. Postoperative pneumocephalus causes mass effect on the bilateral frontal lobes resulting in a somewhat peaked appearance of the bilateral frontal lobes. While nonspecific, this appearance could be seen in the setting of tension pneumocephalus, which is not excluded. 3. Interval decrease in size of bilateral subdural collections which now measure up to 5 mm on the right and 6 mm on the left, previously 1.5 cm on the right 1.1 cm on the left. Interval resolution of midline shift. 4. Slight interval increase in size of the right temporal horn, which may be secondary to decreased mass effect, but early hydrocephalus is not entirely excluded. Recommend attention on follow up. Electronically Signed   By: Lorenza Cambridge M.D.   On: 12/08/2022 08:08   CT Cervical Spine Wo Contrast Result Date: 12/04/2022 CLINICAL DATA:  Neck trauma (Age >= 65y) EXAM: CT CERVICAL SPINE WITHOUT CONTRAST TECHNIQUE: Multidetector CT imaging of the cervical spine was performed without intravenous contrast. Multiplanar CT image reconstructions were also generated. RADIATION DOSE REDUCTION: This exam was performed according to the departmental dose-optimization program which includes automated exposure control, adjustment of the mA and/or kV according to patient size and/or use of iterative reconstruction technique. COMPARISON:  None Available. FINDINGS: Alignment: Straightening of normal lordosis. No traumatic subluxation. Skull base and vertebrae: No acute  fracture. Vertebral body heights are maintained. The dens and skull base are  intact. Soft tissues and spinal canal: No prevertebral fluid or swelling. No visible canal hematoma. Disc levels: Mild diffuse disc space narrowing and spurring. No high-grade canal stenosis. Upper chest: No acute findings. Other: None. IMPRESSION: Mild degenerative change. No acute fracture or subluxation of the cervical spine. Electronically Signed   By: Narda Rutherford M.D.   On: 12/04/2022 21:01   CT Head Wo Contrast Result Date: 12/04/2022 CLINICAL DATA:  Trauma EXAM: CT HEAD WITHOUT CONTRAST TECHNIQUE: Contiguous axial images were obtained from the base of the skull through the vertex without intravenous contrast. RADIATION DOSE REDUCTION: This exam was performed according to the departmental dose-optimization program which includes automated exposure control, adjustment of the mA and/or kV according to patient size and/or use of iterative reconstruction technique. COMPARISON:  MRI brain 07/17/2016.  CT head 11/12/2014. FINDINGS: Brain: There is right-sided holo hemispheric subdural hematoma measuring up to 17 mm in thickness. This is intermediate density. There is also left-sided holo hemispheric subdural hematoma measuring up to 10 mm which is predominantly intermediate density with a small amount of hyperdensity seen. There are additional very small areas of hyperdense acute subdural hematoma overlying the falx measuring 3 mm or left predominantly anteriorly. There is resultant 4 mm of midline shift to the left. There is likely a small amount of subarachnoid hemorrhage in the left temporal lobe in the middle cranial fossa. There is no hydrocephalus or acute infarct. Gray-white matter distinction is preserved. Vascular: No hyperdense vessel or unexpected calcification. Skull: Normal. Negative for fracture or focal lesion. Sinuses/Orbits: No acute finding. Other: None. IMPRESSION: 1. Bilateral holo hemispheric subdural hematomas  (likely acute on subacute), right greater than left. There is resultant 4 mm of midline shift to the left. 2. Likely small amount of subarachnoid hemorrhage in the left temporal lobe in the middle cranial fossa. These results were called by telephone at the time of interpretation on 12/04/2022 at 8:32 pm to provider [facility] , who verbally acknowledged these results. Electronically Signed   By: Darliss Cheney M.D.   On: 12/04/2022 20:33    Microbiology: Results for orders placed or performed during the hospital encounter of 12/04/22  MRSA Next Gen by PCR, Nasal     Status: None   Collection Time: 12/07/22  7:40 PM   Specimen: Nasal Mucosa; Nasal Swab  Result Value Ref Range Status   MRSA by PCR Next Gen NOT DETECTED NOT DETECTED Final    Comment: (NOTE) The GeneXpert MRSA Assay (FDA approved for NASAL specimens only), is one component of a comprehensive MRSA colonization surveillance program. It is not intended to diagnose MRSA infection nor to guide or monitor treatment for MRSA infections. Test performance is not FDA approved in patients less than 62 years old. Performed at [facility], [address] [place], [place] [address]   Culture, blood (Routine X 2) w Reflex to ID Panel     Status: None   Collection Time: 12/09/22  5:27 PM   Specimen: BLOOD  Result Value Ref Range Status   Specimen Description BLOOD LFOA  Final   Special Requests   Final    BOTTLES DRAWN AEROBIC AND ANAEROBIC Blood Culture adequate volume   Culture   Final    NO GROWTH 5 DAYS Performed at [facility], [address], [place], [place] [address]    Report Status 12/14/2022 FINAL  Final  Culture, blood (Routine X 2) w Reflex to ID Panel     Status: None   Collection  Time: 12/09/22  5:32 PM   Specimen: BLOOD  Result Value Ref Range Status   Specimen Description BLOOD RFOA  Final   Special Requests   Final    BOTTLES DRAWN AEROBIC ONLY Blood Culture results may not be optimal  due to an inadequate volume of blood received in culture bottles   Culture   Final    NO GROWTH 5 DAYS Performed at [facility], [address], [place], [place] [address]    Report Status 12/14/2022 FINAL  Final  SARS Coronavirus 2 by RT PCR (hospital order, performed in [facility] hospital lab) *cepheid single result test* Anterior Nasal Swab     Status: None   Collection Time: 12/27/22 10:45 AM   Specimen: Anterior Nasal Swab  Result Value Ref Range Status   SARS Coronavirus 2 by RT PCR NEGATIVE NEGATIVE Final    Comment: (NOTE) SARS-CoV-2 target nucleic acids are NOT DETECTED.  The SARS-CoV-2 RNA is generally detectable in upper and lower respiratory specimens during the acute phase of infection. The lowest concentration of SARS-CoV-2 viral copies this assay can detect is 250 copies / mL. A negative result does not preclude SARS-CoV-2 infection and should not be used as the sole basis for treatment or other patient management decisions.  A negative result may occur with improper specimen collection / handling, submission of specimen other than nasopharyngeal swab, presence of viral mutation(s) within the areas targeted by this assay, and inadequate number of viral copies (<250 copies / mL). A negative result must be combined with clinical observations, patient history, and epidemiological information.  Fact Sheet for Patients:   RoadLapTop.co.za  Fact Sheet for Healthcare Providers: http://kim-miller.com/  This test is not yet approved or  cleared by the [place] FDA and has been authorized for detection and/or diagnosis of SARS-CoV-2 by FDA under an Emergency Use Authorization (EUA).  This EUA will remain in effect (meaning this test can be used) for the duration of the COVID-19 declaration under Section 564(b)(1) of the Act, 21 U.S.C. section 360bbb-3(b)(1), unless the authorization is terminated or revoked  sooner.  Performed at [facility], [address] [place], [place] [address]     Labs: CBC: Recent Labs  Lab 12/22/22 0419 12/27/22 1114  WBC 9.4 8.6  HGB 8.9* 9.2*  HCT 27.5* 28.2*  MCV 92.6 94.9  PLT 527* 525*   Basic Metabolic Panel: Recent Labs  Lab 12/25/22 0413 12/27/22 1114  NA 137 137  K 4.3 4.1  CL 108 104  CO2 23 26  GLUCOSE 130* 121*  BUN 20 13  CREATININE 0.62 0.53  CALCIUM 8.5* 8.5*  MG 2.0 1.7  PHOS 2.9  --    Liver Function Tests: No results for input(s): "AST", "ALT", "ALKPHOS", "BILITOT", "PROT", "ALBUMIN" in the last 168 hours. CBG: Recent Labs  Lab 12/27/22 1537 12/27/22 2058 12/28/22 0019 12/28/22 0451 12/28/22 0727  GLUCAP 127* 113* 106* 133* 136*    Discharge time spent: greater than 30 minutes.  Signed: Marrion Coy, MD [facility] 12/28/2022

## 2022-12-28 NOTE — Progress Notes (Signed)
Called report to [phone]. Spoke with the oncoming nurse. Gave her full history. IV removed. Ped tube placed under Abdominal binder. Husband at bedside and belongings packed up. VS stable.

## 2022-12-29 NOTE — Progress Notes (Signed)
[facility] Palliative Referral  Late Entry Note for post d/c 12.17.24 @ 1030.  Family accepted bed offer at [facility] for rehab per Allena Katz, SW,TOC.  Family accepted offer for palliative medicine to follow at facility while on rehab.  Referral sent in to referral intake for PM at facility.  Norris Cross, RN Nurse Liaison [phone]

## 2023-01-04 NOTE — Telephone Encounter (Signed)
I spoke to the patient's husband who informed me shew as at [facility] [phone]. I called and has to leave a message with Jessical Small the rehab scheduler.

## 2023-01-07 NOTE — Telephone Encounter (Signed)
CT 01/12/2023

## 2023-01-12 ENCOUNTER — Emergency Department (HOSPITAL_COMMUNITY): Payer: Medicare Other

## 2023-01-12 ENCOUNTER — Encounter (HOSPITAL_COMMUNITY): Payer: Self-pay

## 2023-01-12 ENCOUNTER — Other Ambulatory Visit: Payer: Self-pay

## 2023-01-12 ENCOUNTER — Ambulatory Visit: Payer: Medicare Other

## 2023-01-12 ENCOUNTER — Emergency Department (HOSPITAL_COMMUNITY)
Admission: EM | Admit: 2023-01-12 | Discharge: 2023-01-12 | Disposition: A | Discharge status: Home / Self Care | Payer: Medicare Other | Attending: Emergency Medicine | Admitting: Emergency Medicine

## 2023-01-12 DIAGNOSIS — Z8679 Personal history of other diseases of the circulatory system: Secondary | ICD-10-CM | POA: Diagnosis not present

## 2023-01-12 DIAGNOSIS — Z8673 Personal history of transient ischemic attack (TIA), and cerebral infarction without residual deficits: Secondary | ICD-10-CM | POA: Insufficient documentation

## 2023-01-12 DIAGNOSIS — Z79899 Other long term (current) drug therapy: Secondary | ICD-10-CM | POA: Insufficient documentation

## 2023-01-12 DIAGNOSIS — R930 Abnormal findings on diagnostic imaging of skull and head, not elsewhere classified: Secondary | ICD-10-CM | POA: Insufficient documentation

## 2023-01-12 DIAGNOSIS — Y731 Therapeutic (nonsurgical) and rehabilitative gastroenterology and urology devices associated with adverse incidents: Secondary | ICD-10-CM | POA: Diagnosis not present

## 2023-01-12 DIAGNOSIS — T85528A Displacement of other gastrointestinal prosthetic devices, implants and grafts, initial encounter: Secondary | ICD-10-CM | POA: Insufficient documentation

## 2023-01-12 HISTORY — PX: IR REPLACE G-TUBE SIMPLE WO FLUORO: IMG2323

## 2023-01-12 HISTORY — PX: IR REPLC GASTRO/COLONIC TUBE PERCUT W/FLUORO: IMG2333

## 2023-01-12 LAB — CBC WITH DIFFERENTIAL/PLATELET
Abs Immature Granulocytes: 0.02 10*3/uL (ref 0.00–0.07)
Basophils Absolute: 0.1 10*3/uL (ref 0.0–0.1)
Basophils Relative: 1 %
Eosinophils Absolute: 0.5 10*3/uL (ref 0.0–0.5)
Eosinophils Relative: 7 %
HCT: 32.8 % — ABNORMAL LOW (ref 36.0–46.0)
Hemoglobin: 10.2 g/dL — ABNORMAL LOW (ref 12.0–15.0)
Immature Granulocytes: 0 %
Lymphocytes Relative: 41 %
Lymphs Abs: 3.1 10*3/uL (ref 0.7–4.0)
MCH: 29.9 pg (ref 26.0–34.0)
MCHC: 31.1 g/dL (ref 30.0–36.0)
MCV: 96.2 fL (ref 80.0–100.0)
Monocytes Absolute: 0.7 10*3/uL (ref 0.1–1.0)
Monocytes Relative: 9 %
Neutro Abs: 3.1 10*3/uL (ref 1.7–7.7)
Neutrophils Relative %: 42 %
Platelets: 323 10*3/uL (ref 150–400)
RBC: 3.41 MIL/uL — ABNORMAL LOW (ref 3.87–5.11)
RDW: 14.9 % (ref 11.5–15.5)
WBC: 7.5 10*3/uL (ref 4.0–10.5)
nRBC: 0 % (ref 0.0–0.2)

## 2023-01-12 LAB — BASIC METABOLIC PANEL
Anion gap: 8 (ref 5–15)
BUN: 15 mg/dL (ref 8–23)
CO2: 25 mmol/L (ref 22–32)
Calcium: 8.9 mg/dL (ref 8.9–10.3)
Chloride: 101 mmol/L (ref 98–111)
Creatinine, Ser: 0.64 mg/dL (ref 0.44–1.00)
GFR, Estimated: >60 mL/min (ref >60)
Glucose, Bld: 100 mg/dL — ABNORMAL HIGH (ref 70–99)
Potassium: 4 mmol/L (ref 3.5–5.1)
Sodium: 134 mmol/L — ABNORMAL LOW (ref 135–145)

## 2023-01-12 LAB — PROTIME-INR
INR: 1 (ref 0.8–1.2)
Prothrombin Time: 13.2 s (ref 11.4–15.2)

## 2023-01-12 MED ORDER — LIDOCAINE VISCOUS HCL 2 % MT SOLN
15.0000 mL | Freq: Once | OROMUCOSAL | Status: AC
  Administered 2023-01-12: 7 mL via OROMUCOSAL

## 2023-01-12 MED ORDER — LIDOCAINE VISCOUS HCL 2 % MT SOLN
OROMUCOSAL | Status: AC
  Filled 2023-01-12: qty 15

## 2023-01-12 MED ORDER — IOHEXOL 300 MG/ML  SOLN
50.0000 mL | Freq: Once | INTRAMUSCULAR | Status: AC | PRN
  Administered 2023-01-12: 20 mL

## 2023-01-12 NOTE — ED Provider Notes (Signed)
 [place] EMERGENCY DEPARTMENT AT [facility] Provider Note   CSN: [id number] Arrival date & time: 01/12/23  0025     History  Chief Complaint  Patient presents with   g-tube displaced    Christine Hull is a 72 y.o. female.  Patient presents to the emergency department for evaluation after he was found that her G-tube was dislodged.  Patient with recent traumatic brain injury, had G-tube placed during hospitalization and then was discharged to local nursing home.  Nursing home staff did not know how long the tube had been out or how it was dislodged.       Home Medications Prior to Admission medications   Medication Sig Start Date End Date Taking? Authorizing Provider  donepezil  (ARICEPT ) 10 MG tablet Place 1 tablet (10 mg total) into feeding tube daily. 12/28/22   Laurita Pillion, MD  levothyroxine  (SYNTHROID ) 50 MCG tablet Place 1 tablet (50 mcg total) into feeding tube daily before breakfast. 12/28/22   Laurita Pillion, MD  liothyronine  (CYTOMEL ) 5 MCG tablet Place 1 tablet (5 mcg total) into feeding tube daily. 12/28/22   Laurita Pillion, MD  liver oil-zinc  oxide (DESITIN) 40 % ointment Apply 1 application topically as needed for irritation. 02/16/21   Neldon Hamp RAMAN, PA  Nutritional Supplements (FEEDING SUPPLEMENT, OSMOLITE 1.5 CAL,) LIQD Place 1,000 mLs into feeding tube continuous. 12/28/22   Zhang, Dekui, MD  polyethylene glycol (MIRALAX  / GLYCOLAX ) 17 g packet Place 17 g into feeding tube daily as needed. 12/28/22   Laurita Pillion, MD  sertraline  (ZOLOFT ) 50 MG tablet Place 1 tablet (50 mg total) into feeding tube daily. 12/28/22   Zhang, Dekui, MD  triamcinolone cream (KENALOG) 0.5 % APPLY THIN LAYER TOPICALLY TO THE AFFECTED AREA TWICE DAILY    [provider]  Water  For Irrigation, Sterile (FREE WATER ) SOLN Place 120 mLs into feeding tube every 4 (four) hours. 12/28/22   Laurita Pillion, MD      Allergies    Patient has no known allergies.    Review of  Systems   Review of Systems  Physical Exam Updated Vital Signs BP 110/68   Pulse 71   Temp 98.1 F (36.7 C) (Oral)   Resp 16   Ht 5' 5 (1.651 m)   Wt 56.3 kg   SpO2 98%   BMI 20.65 kg/m  Physical Exam Vitals and nursing note reviewed.  Constitutional:      General: She is not in acute distress.    Appearance: She is well-developed.  HENT:     Head: Normocephalic and atraumatic.     Mouth/Throat:     Mouth: Mucous membranes are moist.  Eyes:     General: Vision grossly intact. Gaze aligned appropriately.     Extraocular Movements: Extraocular movements intact.     Conjunctiva/sclera: Conjunctivae normal.  Cardiovascular:     Rate and Rhythm: Normal rate and regular rhythm.     Pulses: Normal pulses.     Heart sounds: Normal heart sounds, S1 normal and S2 normal. No murmur heard.    No friction rub. No gallop.  Pulmonary:     Effort: Pulmonary effort is normal. No respiratory distress.     Breath sounds: Normal breath sounds.  Abdominal:     General: Bowel sounds are normal.     Palpations: Abdomen is soft.     Tenderness: There is no abdominal tenderness. There is no guarding or rebound.     Hernia: No hernia is present.  Musculoskeletal:        General: No swelling.     Cervical back: Full passive range of motion without pain, normal range of motion and neck supple. No spinous process tenderness or muscular tenderness. Normal range of motion.     Right lower leg: No edema.     Left lower leg: No edema.  Skin:    General: Skin is warm and dry.     Capillary Refill: Capillary refill takes less than 2 seconds.     Findings: No ecchymosis, erythema, rash or wound.     Comments: Stoma left side of abdomen appears mostly closed  Neurological:     General: No focal deficit present.     Mental Status: She is alert and oriented to person, place, and time.     GCS: GCS eye subscore is 4. GCS verbal subscore is 5. GCS motor subscore is 6.     Cranial Nerves: Cranial  nerves 2-12 are intact.     Sensory: Sensation is intact.     Motor: Motor function is intact.     Coordination: Coordination is intact.  Psychiatric:        Attention and Perception: Attention normal.        Mood and Affect: Mood normal.        Speech: Speech normal.        Behavior: Behavior normal.     ED Results / Procedures / Treatments   Labs (all labs ordered are listed, but only abnormal results are displayed) Labs Reviewed  CBC WITH DIFFERENTIAL/PLATELET  BASIC METABOLIC PANEL  PROTIME-INR    EKG None  Radiology No results found.  Procedures Procedures    Medications Ordered in ED Medications - No data to display  ED Course/ Medical Decision Making/ A&P                                 Medical Decision Making Amount and/or Complexity of Data Reviewed Labs: ordered.   Patient sent from nursing home.  Record review reveals that the PEG tube was placed by GI at [facility] on December 12.  She did not have a G-tube prior to that.  At arrival there is no obvious trauma to the PEG tube area, however stoma is mostly closed.  Tube had only been in for a little over 2 weeks, not a candidate for bedside replacement.  Discussed briefly with general surgery on-call.  Recommends contacting IR for replacement.        Final Clinical Impression(s) / ED Diagnoses Final diagnoses:  Gastrojejunostomy tube dislodgement    Rx / DC Orders ED Discharge Orders     None         Khalie Wince, Lonni PARAS, MD 01/12/23 [phone]

## 2023-01-12 NOTE — Discharge Instructions (Signed)
Please follow-up with your family doctor in the office. °

## 2023-01-12 NOTE — Procedures (Signed)
 Interventional Radiology Procedure Note  Procedure: Replacement of gastrostomy tube  Complications: None  Estimated Blood Loss: None  Findings: Gastrostomy site stoma successfully catheterized, allowing dilatation of tract over wire and placement of 18 Fr balloon retention tube into stomach. Tube ready for immediate use.  Marcey DASEN. Luverne, M.D Pager:  [phone]

## 2023-01-12 NOTE — ED Notes (Signed)
[facility] called and transport set up. Stated it should be within the hour on their arrival. Family made aware.

## 2023-01-12 NOTE — ED Provider Notes (Addendum)
 Received patient in turnover from Dr. Haze.  Please see their note for further details of Hx, PE.  Briefly patient is a 72 y.o. female with a g-tube displaced .  Patient with a recent severe stroke.  Plan for G-tube replacement attempts by IR.  If unable to replace today will need formal IR evaluation which likely will occur in the hospital..  IR was able to replace the G-tube at bedside.  D/c home.    Emil Share, DO 01/12/23 [phone]

## 2023-01-12 NOTE — ED Notes (Addendum)
Pt repositioned, fob elevated, and door left open for visibility

## 2023-01-12 NOTE — ED Triage Notes (Signed)
Pt BIBA from [facility], c/o G tube displaced.  EMS reports staff was not a good historian and did not how it came out. Pt is non verbal which is baseline.

## 2023-01-13 ENCOUNTER — Encounter (HOSPITAL_COMMUNITY): Payer: Self-pay | Admitting: Radiology

## 2023-01-14 NOTE — Telephone Encounter (Signed)
 Patient did complete her CT while she was recently in the ED.

## 2023-01-14 NOTE — Telephone Encounter (Signed)
 Noted. Forwarded results to Dr Katrinka Blazing to review for appointment on 01/20/23.

## 2023-01-19 ENCOUNTER — Encounter: Payer: Medicare Other | Admitting: Neurosurgery

## 2023-01-20 ENCOUNTER — Encounter: Payer: Self-pay | Admitting: Neurosurgery

## 2023-01-20 ENCOUNTER — Ambulatory Visit: Payer: Medicare Other | Admitting: Neurosurgery

## 2023-01-20 VITALS — BP 96/72 | Ht 64.0 in | Wt 116.0 lb

## 2023-01-20 DIAGNOSIS — Z09 Encounter for follow-up examination after completed treatment for conditions other than malignant neoplasm: Secondary | ICD-10-CM

## 2023-01-20 DIAGNOSIS — S065XAD Traumatic subdural hemorrhage with loss of consciousness status unknown, subsequent encounter: Secondary | ICD-10-CM

## 2023-01-20 DIAGNOSIS — S065XAA Traumatic subdural hemorrhage with loss of consciousness status unknown, initial encounter: Secondary | ICD-10-CM

## 2023-01-20 NOTE — Progress Notes (Addendum)
 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  She has a history of vascular dementia and was supported at home by her spouse and CNA for her ADL's. She had a fall on 11/9 and over the course of two weeks had increased assistance for her ADLs, decreased mobility and decreased ability to feed herself.  She continued to decline and was taken to the OR for bilateral bur hole evacuation.  She has been discharged to therapy and has been working with them and has been making small improvements on her testing.  She is not yet back to the level as she was before her falls and eventual surgery.  We had a long discussion with both her spouse as well as her daughter today.  I have utilized the care everywhere function in epic to review the outside records available from external health systems.  Review of Systems:  A 10 point review of systems is negative, except for the pertinent positives and negatives detailed in the HPI.  Past Medical History: Past Medical History:  Diagnosis Date   Dementia (HCC)    Heart murmur    Hypertension    Hypothyroidism    Medtronic LINQ 05/09/2013   Near syncope 02/02/2013   Prolonged QT interval 02/02/2013   Sinus bradycardia 05/09/2013    Past Surgical History: Past Surgical History:  Procedure Laterality Date   AUGMENTATION MAMMAPLASTY Bilateral 1990's   had implants put in; had them taken out 6 months later (02/02/2013)   BREAST BIOPSY Right 2011   Sankar,  normal   BURR HOLE Bilateral 12/07/2022   Procedure: SOLMON HERRLICH;  Surgeon: Claudene Penne ORN, MD;  Location: [facility] ORS;  Service: Neurosurgery;  Laterality:  Bilateral;  Bilateral Burr holes   IR GASTROSTOMY TUBE MOD SED  12/22/2022   IR REPLACE G-TUBE SIMPLE WO FLUORO  01/12/2023   LOOP RECORDER IMPLANT N/A 02/03/2013   Procedure: LOOP RECORDER IMPLANT;  Surgeon: Elspeth JAYSON Sage, MD;  Location: [facility] CATH LAB;  Service: Cardiovascular;  Laterality: N/A;   PEG PLACEMENT N/A 12/24/2022   Procedure: PERCUTANEOUS ENDOSCOPIC GASTROSTOMY (PEG) PLACEMENT;  Surgeon: Toledo, Ladell POUR, MD;  Location: [facility] ENDOSCOPY;  Service: Gastroenterology;  Laterality: N/A;    Allergies: Allergies as of 01/20/2023   (No Known Allergies)    Medications:  Current Outpatient Medications:    acetaminophen  (TYLENOL ) 325 MG tablet, Take 650 mg by mouth every 6 (six) hours as needed., Disp: , Rfl:    donepezil  (ARICEPT ) 10 MG tablet, Place 1 tablet (10 mg total) into feeding tube daily., Disp: , Rfl:    levothyroxine  (SYNTHROID ) 50 MCG tablet, Place 1 tablet (50 mcg total) into feeding tube daily before breakfast., Disp: 90 tablet, Rfl: 0   liothyronine  (CYTOMEL ) 5 MCG tablet, Place 1 tablet (5 mcg total) into feeding tube daily., Disp: , Rfl:    liver oil-zinc  oxide (DESITIN) 40 % ointment, Apply 1 application topically as needed for irritation., Disp: 56.7 g, Rfl: 0   Nutritional Supplements (FEEDING SUPPLEMENT, OSMOLITE 1.5 CAL,) LIQD, Place 1,000 mLs into feeding tube continuous., Disp: , Rfl:    nystatin -triamcinolone (MYCOLOG II) cream, Apply topically., Disp: , Rfl:    polyethylene glycol (MIRALAX  /  GLYCOLAX ) 17 g packet, Place 17 g into feeding tube daily as needed., Disp: , Rfl:    sertraline  (ZOLOFT ) 50 MG tablet, Place 1 tablet (50 mg total) into feeding tube daily., Disp: , Rfl:    triamcinolone cream (KENALOG) 0.5 %, APPLY THIN LAYER TOPICALLY TO THE AFFECTED AREA TWICE DAILY, Disp: , Rfl:    Water  For Irrigation, Sterile (FREE WATER ) SOLN, Place 120 mLs into feeding tube every 4 (four) hours., Disp: , Rfl:   Social History: Social History   Tobacco Use    Smoking status: Every Day    Current packs/day: 0.50    Average packs/day: 0.5 packs/day for 41.0 years (20.5 ttl pk-yrs)    Types: Cigarettes   Smokeless tobacco: Never  Substance Use Topics   Alcohol use: Yes    Alcohol/week: 5.0 standard drinks of alcohol    Types: 5 Shots of liquor per week    Comment: 02/02/2013 drink a shot  in my coffee ~ nightly before bed   Drug use: No    Family Medical History: Family History  Problem Relation Age of Onset   Early death Mother        stroke resulting from prior head injury   Stroke Mother    Early death Father        MVA   Cancer Maternal Aunt        stomach cancer   Breast cancer Neg Hx     Physical Examination: Vitals:   01/20/23 0932  BP: 96/72    General: Patient is in no apparent distress. Attention to examination is appropriate.  Neck:   Supple.  Full range of motion.  Respiratory: Patient is breathing without any difficulty.   NEUROLOGICAL:     Awake, alert, will intermittently nod her head or smile at certain comments however does not verbalize.  Strength: Spontaneously moves all 4 extremities, however does not clearly follow commands  Currently in a wheelchair.  Imaging: Preop Narrative & Impression  CLINICAL DATA:  Trauma   EXAM: CT HEAD WITHOUT CONTRAST   TECHNIQUE: Contiguous axial images were obtained from the base of the skull through the vertex without intravenous contrast.   RADIATION DOSE REDUCTION: This exam was performed according to the departmental dose-optimization program which includes automated exposure control, adjustment of the mA and/or kV according to patient size and/or use of iterative reconstruction technique.   COMPARISON:  MRI brain 07/17/2016.  CT head 11/12/2014.   FINDINGS: Brain: There is right-sided holo hemispheric subdural hematoma measuring up to 17 mm in thickness. This is intermediate density. There is also left-sided holo hemispheric subdural  hematoma measuring up to 10 mm which is predominantly intermediate density with a small amount of hyperdensity seen. There are additional very small areas of hyperdense acute subdural hematoma overlying the falx measuring 3 mm or left predominantly anteriorly. There is resultant 4 mm of midline shift to the left. There is likely a small amount of subarachnoid hemorrhage in the left temporal lobe in the middle cranial fossa. There is no hydrocephalus or acute infarct. Gray-white matter distinction is preserved.   Vascular: No hyperdense vessel or unexpected calcification.   Skull: Normal. Negative for fracture or focal lesion.   Sinuses/Orbits: No acute finding.   Other: None.   IMPRESSION: 1. Bilateral holo hemispheric subdural hematomas (likely acute on subacute), right greater than left. There is resultant 4 mm of midline shift to the left. 2. Likely small amount of subarachnoid hemorrhage in the left temporal lobe in  the middle cranial fossa.   These results were called by telephone at the time of interpretation on 12/04/2022 at 8:32 pm to provider [facility] , who verbally acknowledged these results.     Electronically Signed   By: Greig Pique M.D.   On: 12/04/2022 20:33    Narrative & Impression  CLINICAL DATA:  Prior study positive for hemorrhage, follow-up   EXAM: CT HEAD WITHOUT CONTRAST   TECHNIQUE: Contiguous axial images were obtained from the base of the skull through the vertex without intravenous contrast.   RADIATION DOSE REDUCTION: This exam was performed according to the departmental dose-optimization program which includes automated exposure control, adjustment of the mA and/or kV according to patient size and/or use of iterative reconstruction technique.   COMPARISON:  CT head 12/13/2022   FINDINGS: Brain: Interval resolution of the prior pneumocephalus. Bilateral subdural collections are redemonstrated. The subdural collection  on the left is not significantly changed in size and again measures 1.8 cm in greatest radial dimension. Decreased right subdural collection measuring up to 6 mm in greatest radial dimension, previously 11 mm. These are predominantly hypodense with some intermediate density on the left (for example circa series 2/image 20). Unchanged ventricular caliber. Basal cisterns are patent.   No evidence of acute infarct. Moderate atrophy and white matter changes.   Vascular: No hyperdense vessel. Intracranial arterial calcification.   Skull: Bilateral burr holes.  No acute fracture.   Sinuses/Orbits: No acute finding.   Other: None.   IMPRESSION: 1. Similar size of the left subdural collection. There is some intermediate density fluid within the left collection. Acute on chronic hemorrhage is difficult to exclude. 2. Decreased size of the subdural collection on the right.     Electronically Signed   By: Norman Gatlin M.D.   On: 01/12/2023 14:06     Narrative & Impression  CLINICAL DATA:  Prior study positive for hemorrhage, follow-up   EXAM: CT HEAD WITHOUT CONTRAST   TECHNIQUE: Contiguous axial images were obtained from the base of the skull through the vertex without intravenous contrast.   RADIATION DOSE REDUCTION: This exam was performed according to the departmental dose-optimization program which includes automated exposure control, adjustment of the mA and/or kV according to patient size and/or use of iterative reconstruction technique.   COMPARISON:  CT head 12/13/2022   FINDINGS: Brain: Interval resolution of the prior pneumocephalus. Bilateral subdural collections are redemonstrated. The subdural collection on the left is not significantly changed in size and again measures 1.8 cm in greatest radial dimension. Decreased right subdural collection measuring up to 6 mm in greatest radial dimension, previously 11 mm. These are predominantly hypodense with some  intermediate density on the left (for example circa series 2/image 20). Unchanged ventricular caliber. Basal cisterns are patent.   No evidence of acute infarct. Moderate atrophy and white matter changes.   Vascular: No hyperdense vessel. Intracranial arterial calcification.   Skull: Bilateral burr holes.  No acute fracture.   Sinuses/Orbits: No acute finding.   Other: None.   IMPRESSION: 1. Similar size of the left subdural collection. There is some intermediate density fluid within the left collection. Acute on chronic hemorrhage is difficult to exclude. 2. Decreased size of the subdural collection on the right.     Electronically Signed   By: Norman Gatlin M.D.   On: 01/12/2023 14:06       Medical Decision Making/Assessment and Plan: Christine Hull is a pleasant 73 y.o. female with a history of bur hole  evacuation for bilateral subdural hematomas.  She no longer has any midline shift.  Overall she is healing well.  Her left-sided subdural collection appears to be possibly expanding maybe with new membranous formations.   We had a long discussion with both her spouse as well as her daughter today about her dementia as well as bilateral subdural hematoma treatment.  We discussed that patients with brain injuries plus dementia often have worsened outcomes and much slower recovery.  She lived at home but required assistance of her caregivers and CNA's due to a history of vascular dementia. After her fall on 11/9 she progressively declined needing increased support for ambulation eating  as well as for her other ADLs.  She has not returned to her baseline before the falls and subsequent decline.  We discussed that it is unlikely that she would have a full recovery to how she was before her subdural hematoma.  We do see improvements in her wakefulness as well as some of her functions, however these are quite limited and not clear improvements to the family.  We discussed staying engaged  with the palliative care medicine team to help make sure that our plans are consistent with her goals of care.  Would like to continue to follow her for any worsening.  Would like to get a repeat head CT and follow-up with her in clinic.  Should she have any expansion of the left-sided subdural hematoma which shows some membrane formation she would potentially be a candidate for MMA embolization, however we want to make sure that any procedure we recommend going forward makes sense with her goals of care and current plans.  Thank you for involving me in the care of this patient.   Penne MICAEL Sharps MD/MSCR Neurosurgery

## 2023-02-22 ENCOUNTER — Encounter: Payer: Medicare Other | Admitting: Physician Assistant

## 2023-03-22 ENCOUNTER — Ambulatory Visit
Admission: RE | Admit: 2023-03-22 | Discharge: 2023-03-22 | Disposition: A | Discharge status: Home / Self Care | Payer: Medicare Other | Source: Ambulatory Visit | Attending: Neurosurgery | Admitting: Neurosurgery

## 2023-03-22 DIAGNOSIS — S065XAA Traumatic subdural hemorrhage with loss of consciousness status unknown, initial encounter: Secondary | ICD-10-CM | POA: Diagnosis present

## 2023-03-29 ENCOUNTER — Ambulatory Visit (INDEPENDENT_AMBULATORY_CARE_PROVIDER_SITE_OTHER): Payer: Medicare Other | Admitting: Neurosurgery

## 2023-03-29 ENCOUNTER — Encounter: Payer: Self-pay | Admitting: Neurosurgery

## 2023-03-29 VITALS — Ht 64.0 in | Wt 116.0 lb

## 2023-03-29 DIAGNOSIS — S065XAD Traumatic subdural hemorrhage with loss of consciousness status unknown, subsequent encounter: Secondary | ICD-10-CM

## 2023-03-29 DIAGNOSIS — Z09 Encounter for follow-up examination after completed treatment for conditions other than malignant neoplasm: Secondary | ICD-10-CM

## 2023-03-29 DIAGNOSIS — S065XAA Traumatic subdural hemorrhage with loss of consciousness status unknown, initial encounter: Secondary | ICD-10-CM

## 2023-03-29 NOTE — Progress Notes (Signed)
 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  Overall she has been continuing to work and her rehabilitation center.  She has been taking.  Foods, she gets supplemented with tube feeds.  She continues to increase her cognitive effort since her last follow-up.  Overall they are pleased with her recovery trajectory.  I have utilized the care everywhere function in epic to review the outside records available from external health systems.  Review of Systems:  A 10 point review of systems is negative, except for the pertinent positives and negatives detailed in the HPI.  Past Medical History: Past Medical History:  Diagnosis Date   Dementia (HCC)    Heart murmur    Hypertension    Hypothyroidism    Medtronic LINQ 05/09/2013   Near syncope 02/02/2013   Prolonged QT interval 02/02/2013   Sinus bradycardia 05/09/2013    Past Surgical History: Past Surgical History:  Procedure Laterality Date   AUGMENTATION MAMMAPLASTY Bilateral 1990's   "had implants put in; had them taken out 6 months later" (02/02/2013)   BREAST BIOPSY Right 2011   Sankar,  normal   BURR HOLE Bilateral 12/07/2022   Procedure: Ezekiel Ina;  Surgeon: Lovenia Kim, MD;  Location: [facility] ORS;  Service: Neurosurgery;  Laterality: Bilateral;  Bilateral Burr holes   IR GASTROSTOMY TUBE MOD SED  12/22/2022   IR REPLACE G-TUBE SIMPLE WO FLUORO  01/12/2023   LOOP RECORDER IMPLANT N/A 02/03/2013   Procedure: LOOP RECORDER IMPLANT;  Surgeon: Duke Salvia, MD;  Location: [facility] CATH LAB;  Service: Cardiovascular;  Laterality: N/A;   PEG PLACEMENT N/A 12/24/2022    Procedure: PERCUTANEOUS ENDOSCOPIC GASTROSTOMY (PEG) PLACEMENT;  Surgeon: Toledo, Boykin Nearing, MD;  Location: [facility] ENDOSCOPY;  Service: Gastroenterology;  Laterality: N/A;    Allergies: Allergies as of 03/29/2023   (No Known Allergies)    Medications:  Current Outpatient Medications:    acetaminophen (TYLENOL) 325 MG tablet, Take 650 mg by mouth every 6 (six) hours as needed., Disp: , Rfl:    donepezil (ARICEPT) 10 MG tablet, Place 1 tablet (10 mg total) into feeding tube daily., Disp: , Rfl:    levothyroxine (SYNTHROID) 50 MCG tablet, Place 1 tablet (50 mcg total) into feeding tube daily before breakfast., Disp: 90 tablet, Rfl: 0   liothyronine (CYTOMEL) 5 MCG tablet, Place 1 tablet (5 mcg total) into feeding tube daily., Disp: , Rfl:    liver oil-zinc oxide (DESITIN) 40 % ointment, Apply 1 application topically as needed for irritation., Disp: 56.7 g, Rfl: 0   Nutritional Supplements (FEEDING SUPPLEMENT, OSMOLITE 1.5 CAL,) LIQD, Place 1,000 mLs into feeding tube continuous., Disp: , Rfl:    nystatin-triamcinolone (MYCOLOG II) cream, Apply topically., Disp: , Rfl:    polyethylene glycol (MIRALAX / GLYCOLAX) 17 g packet, Place 17 g into feeding tube daily as needed., Disp: , Rfl:    sertraline (ZOLOFT) 50 MG tablet, Place 1 tablet (50 mg total) into feeding tube daily., Disp: , Rfl:    triamcinolone cream (KENALOG) 0.5 %, APPLY THIN LAYER TOPICALLY TO THE AFFECTED AREA TWICE DAILY, Disp: , Rfl:    Water For Irrigation, Sterile (FREE WATER) SOLN,  Place 120 mLs into feeding tube every 4 (four) hours., Disp: , Rfl:   Social History: Social History   Tobacco Use   Smoking status: Every Day    Current packs/day: 0.50    Average packs/day: 0.5 packs/day for 41.0 years (20.5 ttl pk-yrs)    Types: Cigarettes   Smokeless tobacco: Never  Substance Use Topics   Alcohol use: Yes    Alcohol/week: 5.0 standard drinks of alcohol    Types: 5 Shots of liquor per week    Comment: 02/02/2013 "drink a  shot  in my coffee ~ nightly before bed"   Drug use: No    Family Medical History: Family History  Problem Relation Age of Onset   Early death Mother        stroke resulting from prior head injury   Stroke Mother    Early death Father        MVA   Cancer Maternal Aunt        stomach cancer   Breast cancer Neg Hx     Physical Examination: There were no vitals filed for this visit.   General: Patient is in no apparent distress. Attention to examination is appropriate.  Neck:   Supple.  Full range of motion.  Respiratory: Patient is breathing without any difficulty.   NEUROLOGICAL:     Awake and much more engaged than her previous visit.  Making eye contact the entire time.  Will smile and nod.  She does intermittently verbalize which she was not doing previously.  Strength: Spontaneously moves all 4 extremities, however does not clearly follow commands  Currently in a wheelchair.  Imaging:     Medical Decision Making/Assessment and Plan: Christine Hull is a pleasant 73 y.o. female with a history of bur hole evacuation for bilateral subdural hematomas.  CT scan was reviewed today shows expected maturation of her hematoma.  Decreased density noted as expected.  No midline shift.  Overall the collection looks smaller as well.  This goes well with her clinical improvement.  She does not seem to be complaining of any headaches or any pain.  She overall is much more alert and attentive.  I feel that she would continue to benefit from physical therapy now for improved strength and conditioning given the fact that she continues to improve from a neurologic standpoint.  We often see patients continue to improve up to 2 years after a neurologic insult.  They'd like to follow up in 6 months with repeat ct scan.   Thank you for involving me in the care of this patient.   Lovenia Kim MD/MSCR Neurosurgery  I spent total of 20 minutes reviewing her chart, reviewing her new  imaging, face-to-face evaluation, and documentation.

## 2023-04-23 ENCOUNTER — Encounter: Payer: Self-pay | Admitting: Neurosurgery

## 2023-09-29 ENCOUNTER — Encounter: Payer: Self-pay | Admitting: Neurosurgery

## 2023-09-29 ENCOUNTER — Ambulatory Visit (INDEPENDENT_AMBULATORY_CARE_PROVIDER_SITE_OTHER): Admitting: Neurosurgery

## 2023-09-29 VITALS — BP 130/72 | Ht 65.0 in | Wt 128.0 lb

## 2023-09-29 DIAGNOSIS — S065XAD Traumatic subdural hemorrhage with loss of consciousness status unknown, subsequent encounter: Secondary | ICD-10-CM

## 2023-09-29 DIAGNOSIS — Z09 Encounter for follow-up examination after completed treatment for conditions other than malignant neoplasm: Secondary | ICD-10-CM | POA: Diagnosis not present

## 2023-09-29 DIAGNOSIS — S065XAA Traumatic subdural hemorrhage with loss of consciousness status unknown, initial encounter: Secondary | ICD-10-CM

## 2023-09-29 NOTE — Progress Notes (Signed)
 Referring Physician:  Karlene Mungo, MD [address] 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  She has been doing very well and has been progressing with physical therapy.  She has since stopped her formal physical and Occupational Therapy.  She is had her G-tube removed.  Husband states that she is now at her prefall baseline.  Overall they are very pleased with her recovery.  Review of Systems:  A 10 point review of systems is negative, except for the pertinent positives and negatives detailed in the HPI.  Past Medical History: Past Medical History:  Diagnosis Date   Dementia (HCC)    Heart murmur    Hypertension    Hypothyroidism    Medtronic LINQ 05/09/2013   Near syncope 02/02/2013   Prolonged QT interval 02/02/2013   Sinus bradycardia 05/09/2013    Past Surgical History: Past Surgical History:  Procedure Laterality Date   AUGMENTATION MAMMAPLASTY Bilateral 1990's   had implants put in; had them taken out 6 months later (02/02/2013)   BREAST BIOPSY Right 2011   Sankar,  normal   BURR HOLE Bilateral 12/07/2022   Procedure: SOLMON HERRLICH;  Surgeon: Claudene Penne ORN, MD;  Location: [facility] ORS;  Service: Neurosurgery;  Laterality: Bilateral;  Bilateral Burr holes   IR GASTROSTOMY TUBE MOD SED  12/22/2022   IR REPLACE G-TUBE SIMPLE WO FLUORO  01/12/2023   LOOP RECORDER IMPLANT N/A 02/03/2013   Procedure: LOOP RECORDER IMPLANT;  Surgeon: Elspeth JAYSON Sage, MD;  [facility] CATH LAB;  Service: Cardiovascular;  Laterality: N/A;   PEG PLACEMENT N/A 12/24/2022   Procedure: PERCUTANEOUS ENDOSCOPIC GASTROSTOMY (PEG) PLACEMENT;  Surgeon: Toledo, Ladell POUR, MD;  Location: [facility] ENDOSCOPY;  Service: Gastroenterology;  Laterality: N/A;     Allergies: Allergies as of 09/29/2023   (No Known Allergies)    Medications:  Current Outpatient Medications:    donepezil  (ARICEPT ) 10 MG tablet, Place 1 tablet (10 mg total) into feeding tube daily., Disp: , Rfl:    levothyroxine  (SYNTHROID ) 50 MCG tablet, Place 1 tablet (50 mcg total) into feeding tube daily before breakfast., Disp: 90 tablet, Rfl: 0   liothyronine  (CYTOMEL ) 5 MCG tablet, Place 1 tablet (5 mcg total) into feeding tube daily., Disp: , Rfl:    nadolol  (CORGARD ) 20 MG tablet, Take 20 mg by mouth daily., Disp: , Rfl:    polyethylene glycol (MIRALAX  / GLYCOLAX ) 17 g packet, Take 17 g by mouth daily as needed., Disp: , Rfl:   Social History: Social History   Tobacco Use   Smoking status: Former    Current packs/day: 0.50    Average packs/day: 0.5 packs/day for 41.0 years (20.5 ttl pk-yrs)    Types: Cigarettes   Smokeless tobacco: Never   Tobacco comments:    QUIT IN 2020  Substance Use Topics   Alcohol use: Yes    Alcohol/week: 5.0 standard drinks of alcohol    Types: 5 Shots of liquor per week    Comment: 02/02/2013 drink a shot  in my coffee ~ nightly before bed   Drug use: No    Family Medical History: Family History  Problem Relation Age of Onset   Early death Mother        stroke resulting from prior head injury   Stroke Mother  Early death Father        MVA   Cancer Maternal Aunt        stomach cancer   Breast cancer Neg Hx     Physical Examination: Vitals:   09/29/23 1107  BP: 130/72     General: Patient is in no apparent distress. Attention to examination is at her baseline  Neck:   Supple.  Full range of motion.  Respiratory: Patient is breathing without any difficulty.   NEUROLOGICAL:     Spontaneously moving all 4 extremities.  Intermittently verbalizing.  Able to sit unsupported.  Currently walking rather than utilizing wheelchair as she was at her last appointment.  Imaging: No new imaging to review Medical Decision  Making/Assessment and Plan: Ms. Ellery is a pleasant 73 y.o. female with a history of bur hole evacuation for bilateral subdural hematomas.  Overall she continues to have a good outcome.  She had bilateral bur hole decompression in November and has continued to see improvement.  She is no longer utilizing a G-tube.  Working with SLP to progress to chopped foods.  Is much more alert and ambulating without use of a wheelchair as she was the last time we saw her.  Overall her husband's very happy that she has recovered to her prefall baseline.  The plan on reaching out to us  if they have any further questions or if she has any worsening of symptoms.  Penne MICAEL Sharps MD/MSCR Neurosurgery

## 2023-10-03 LAB — COLOGUARD: COLOGUARD: NEGATIVE

## 2023-10-05 ENCOUNTER — Encounter: Payer: Self-pay | Admitting: Physician Assistant

## 2023-10-05 ENCOUNTER — Other Ambulatory Visit: Payer: Self-pay | Admitting: Physician Assistant

## 2023-10-05 DIAGNOSIS — Z13820 Encounter for screening for osteoporosis: Secondary | ICD-10-CM

## 2023-10-05 DIAGNOSIS — Z780 Asymptomatic menopausal state: Secondary | ICD-10-CM

## 2023-11-01 ENCOUNTER — Encounter: Payer: Self-pay | Admitting: Physician Assistant

## 2023-11-03 ENCOUNTER — Other Ambulatory Visit: Payer: Self-pay | Admitting: Internal Medicine

## 2023-11-03 ENCOUNTER — Encounter: Payer: Self-pay | Admitting: Internal Medicine

## 2023-11-03 DIAGNOSIS — Z1231 Encounter for screening mammogram for malignant neoplasm of breast: Secondary | ICD-10-CM

## 2023-11-03 DIAGNOSIS — M810 Age-related osteoporosis without current pathological fracture: Secondary | ICD-10-CM

## 2023-11-03 DIAGNOSIS — Z13820 Encounter for screening for osteoporosis: Secondary | ICD-10-CM

## 2023-12-07 ENCOUNTER — Ambulatory Visit
Admission: RE | Admit: 2023-12-07 | Discharge: 2023-12-07 | Disposition: A | Discharge status: Home / Self Care | Source: Ambulatory Visit | Attending: Internal Medicine | Admitting: Internal Medicine

## 2023-12-07 DIAGNOSIS — Z1231 Encounter for screening mammogram for malignant neoplasm of breast: Secondary | ICD-10-CM | POA: Insufficient documentation

## 2023-12-07 DIAGNOSIS — M810 Age-related osteoporosis without current pathological fracture: Secondary | ICD-10-CM | POA: Diagnosis present
